# Patient Record
Sex: Male | Born: 1950 | Race: White | Hispanic: No | Marital: Married | Smoking: Current every day smoker
Health system: Southern US, Community
[De-identification: ages and names within clinical notes are randomized; demographics above are authoritative.]

## PROBLEM LIST (undated history)

## (undated) DIAGNOSIS — F32A Depression, unspecified: Secondary | ICD-10-CM

## (undated) DIAGNOSIS — Z9989 Dependence on other enabling machines and devices: Secondary | ICD-10-CM

## (undated) DIAGNOSIS — G8929 Other chronic pain: Secondary | ICD-10-CM

## (undated) DIAGNOSIS — K589 Irritable bowel syndrome without diarrhea: Secondary | ICD-10-CM

## (undated) DIAGNOSIS — F329 Major depressive disorder, single episode, unspecified: Secondary | ICD-10-CM

## (undated) DIAGNOSIS — I10 Essential (primary) hypertension: Secondary | ICD-10-CM

## (undated) DIAGNOSIS — G4733 Obstructive sleep apnea (adult) (pediatric): Secondary | ICD-10-CM

## (undated) DIAGNOSIS — D369 Benign neoplasm, unspecified site: Secondary | ICD-10-CM

## (undated) DIAGNOSIS — E785 Hyperlipidemia, unspecified: Secondary | ICD-10-CM

## (undated) DIAGNOSIS — K219 Gastro-esophageal reflux disease without esophagitis: Secondary | ICD-10-CM

## (undated) DIAGNOSIS — J449 Chronic obstructive pulmonary disease, unspecified: Secondary | ICD-10-CM

## (undated) DIAGNOSIS — C966 Unifocal Langerhans-cell histiocytosis: Secondary | ICD-10-CM

## (undated) DIAGNOSIS — K635 Polyp of colon: Secondary | ICD-10-CM

## (undated) DIAGNOSIS — I340 Nonrheumatic mitral (valve) insufficiency: Secondary | ICD-10-CM

## (undated) DIAGNOSIS — M545 Low back pain, unspecified: Secondary | ICD-10-CM

## (undated) DIAGNOSIS — I2510 Atherosclerotic heart disease of native coronary artery without angina pectoris: Secondary | ICD-10-CM

## (undated) DIAGNOSIS — F419 Anxiety disorder, unspecified: Secondary | ICD-10-CM

## (undated) HISTORY — PX: FOOT SURGERY: SHX648

## (undated) HISTORY — PX: TONSILLECTOMY: SUR1361

## (undated) HISTORY — DX: Other chronic pain: G89.29

## (undated) HISTORY — DX: Nonrheumatic mitral (valve) insufficiency: I34.0

## (undated) HISTORY — PX: ESOPHAGOGASTRODUODENOSCOPY: SHX1529

## (undated) HISTORY — PX: EAR PINNA RECONSTRUCTION W/ RIB GRAFT: SHX1484

## (undated) HISTORY — PX: INGUINAL HERNIA REPAIR: SUR1180

## (undated) HISTORY — DX: Low back pain, unspecified: M54.50

## (undated) HISTORY — DX: Irritable bowel syndrome, unspecified: K58.9

## (undated) HISTORY — DX: Hyperlipidemia, unspecified: E78.5

## (undated) HISTORY — PX: CHOLECYSTECTOMY: SHX55

## (undated) HISTORY — DX: Essential (primary) hypertension: I10

## (undated) HISTORY — DX: Obstructive sleep apnea (adult) (pediatric): G47.33

## (undated) HISTORY — DX: Depression, unspecified: F32.A

## (undated) HISTORY — DX: Low back pain: M54.5

## (undated) HISTORY — DX: Major depressive disorder, single episode, unspecified: F32.9

## (undated) HISTORY — DX: Gastro-esophageal reflux disease without esophagitis: K21.9

## (undated) HISTORY — DX: Benign neoplasm, unspecified site: D36.9

## (undated) HISTORY — DX: Atherosclerotic heart disease of native coronary artery without angina pectoris: I25.10

## (undated) HISTORY — DX: Polyp of colon: K63.5

## (undated) HISTORY — DX: Unifocal Langerhans-cell histiocytosis: C96.6

## (undated) HISTORY — DX: Obstructive sleep apnea (adult) (pediatric): Z99.89

## (undated) HISTORY — DX: Anxiety disorder, unspecified: F41.9

---

## 2000-10-25 ENCOUNTER — Encounter: Payer: Self-pay | Admitting: Internal Medicine

## 2000-10-25 ENCOUNTER — Ambulatory Visit (HOSPITAL_COMMUNITY): Admission: RE | Admit: 2000-10-25 | Discharge: 2000-10-25 | Payer: Self-pay | Admitting: Internal Medicine

## 2000-12-19 ENCOUNTER — Ambulatory Visit (HOSPITAL_COMMUNITY): Admission: RE | Admit: 2000-12-19 | Discharge: 2000-12-19 | Payer: Self-pay | Admitting: Internal Medicine

## 2001-01-11 ENCOUNTER — Encounter: Payer: Self-pay | Admitting: Emergency Medicine

## 2001-01-11 ENCOUNTER — Emergency Department (HOSPITAL_COMMUNITY): Admission: EM | Admit: 2001-01-11 | Discharge: 2001-01-12 | Payer: Self-pay | Admitting: Emergency Medicine

## 2001-01-17 ENCOUNTER — Ambulatory Visit (HOSPITAL_COMMUNITY): Admission: RE | Admit: 2001-01-17 | Discharge: 2001-01-17 | Payer: Self-pay | Admitting: Internal Medicine

## 2001-02-19 ENCOUNTER — Ambulatory Visit (HOSPITAL_COMMUNITY): Admission: RE | Admit: 2001-02-19 | Discharge: 2001-02-19 | Payer: Self-pay | Admitting: Internal Medicine

## 2001-02-19 ENCOUNTER — Encounter: Payer: Self-pay | Admitting: Internal Medicine

## 2001-03-22 ENCOUNTER — Encounter (HOSPITAL_COMMUNITY): Admission: RE | Admit: 2001-03-22 | Discharge: 2001-04-21 | Payer: Self-pay | Admitting: Podiatry

## 2001-10-22 ENCOUNTER — Encounter: Payer: Self-pay | Admitting: Family Medicine

## 2001-10-22 ENCOUNTER — Ambulatory Visit (HOSPITAL_COMMUNITY): Admission: RE | Admit: 2001-10-22 | Discharge: 2001-10-22 | Payer: Self-pay | Admitting: Family Medicine

## 2001-10-29 ENCOUNTER — Ambulatory Visit (HOSPITAL_COMMUNITY): Admission: RE | Admit: 2001-10-29 | Discharge: 2001-10-29 | Payer: Self-pay | Admitting: Internal Medicine

## 2002-02-06 ENCOUNTER — Encounter: Payer: Self-pay | Admitting: Family Medicine

## 2002-02-06 ENCOUNTER — Ambulatory Visit (HOSPITAL_COMMUNITY): Admission: RE | Admit: 2002-02-06 | Discharge: 2002-02-06 | Payer: Self-pay | Admitting: Family Medicine

## 2002-06-19 ENCOUNTER — Encounter: Payer: Self-pay | Admitting: Family Medicine

## 2002-06-19 ENCOUNTER — Ambulatory Visit (HOSPITAL_COMMUNITY): Admission: RE | Admit: 2002-06-19 | Discharge: 2002-06-19 | Payer: Self-pay | Admitting: Family Medicine

## 2002-06-27 ENCOUNTER — Encounter: Payer: Self-pay | Admitting: Family Medicine

## 2002-06-27 ENCOUNTER — Ambulatory Visit (HOSPITAL_COMMUNITY): Admission: RE | Admit: 2002-06-27 | Discharge: 2002-06-27 | Payer: Self-pay | Admitting: Family Medicine

## 2003-01-10 ENCOUNTER — Ambulatory Visit (HOSPITAL_COMMUNITY): Admission: RE | Admit: 2003-01-10 | Discharge: 2003-01-10 | Payer: Self-pay | Admitting: Family Medicine

## 2003-01-10 ENCOUNTER — Encounter: Payer: Self-pay | Admitting: Family Medicine

## 2003-02-06 ENCOUNTER — Emergency Department (HOSPITAL_COMMUNITY): Admission: EM | Admit: 2003-02-06 | Discharge: 2003-02-06 | Payer: Self-pay | Admitting: Emergency Medicine

## 2003-02-26 ENCOUNTER — Encounter: Payer: Self-pay | Admitting: Internal Medicine

## 2003-02-26 ENCOUNTER — Ambulatory Visit (HOSPITAL_COMMUNITY): Admission: RE | Admit: 2003-02-26 | Discharge: 2003-02-26 | Payer: Self-pay | Admitting: Internal Medicine

## 2003-04-16 ENCOUNTER — Ambulatory Visit (HOSPITAL_COMMUNITY): Admission: RE | Admit: 2003-04-16 | Discharge: 2003-04-16 | Payer: Self-pay | Admitting: Family Medicine

## 2004-07-20 ENCOUNTER — Ambulatory Visit (HOSPITAL_COMMUNITY): Admission: RE | Admit: 2004-07-20 | Discharge: 2004-07-20 | Payer: Self-pay | Admitting: Family Medicine

## 2004-11-24 ENCOUNTER — Ambulatory Visit: Payer: Self-pay | Admitting: Internal Medicine

## 2004-12-14 ENCOUNTER — Ambulatory Visit (HOSPITAL_COMMUNITY): Admission: RE | Admit: 2004-12-14 | Discharge: 2004-12-14 | Payer: Self-pay | Admitting: Internal Medicine

## 2004-12-14 ENCOUNTER — Ambulatory Visit: Payer: Self-pay | Admitting: Internal Medicine

## 2004-12-15 ENCOUNTER — Ambulatory Visit (HOSPITAL_COMMUNITY): Admission: RE | Admit: 2004-12-15 | Discharge: 2004-12-15 | Payer: Self-pay | Admitting: Internal Medicine

## 2005-01-26 ENCOUNTER — Ambulatory Visit: Payer: Self-pay | Admitting: Internal Medicine

## 2005-02-15 ENCOUNTER — Ambulatory Visit (HOSPITAL_COMMUNITY): Admission: RE | Admit: 2005-02-15 | Discharge: 2005-02-15 | Payer: Self-pay | Admitting: *Deleted

## 2005-02-17 ENCOUNTER — Ambulatory Visit (HOSPITAL_COMMUNITY): Admission: RE | Admit: 2005-02-17 | Discharge: 2005-02-17 | Payer: Self-pay | Admitting: Cardiology

## 2005-05-23 HISTORY — PX: COLONOSCOPY: SHX174

## 2005-09-22 ENCOUNTER — Ambulatory Visit: Payer: Self-pay | Admitting: Internal Medicine

## 2006-03-27 ENCOUNTER — Ambulatory Visit: Payer: Self-pay | Admitting: Internal Medicine

## 2006-03-28 ENCOUNTER — Ambulatory Visit (HOSPITAL_COMMUNITY): Admission: RE | Admit: 2006-03-28 | Discharge: 2006-03-28 | Payer: Self-pay | Admitting: Family Medicine

## 2006-03-30 ENCOUNTER — Ambulatory Visit (HOSPITAL_COMMUNITY): Admission: RE | Admit: 2006-03-30 | Discharge: 2006-03-30 | Payer: Self-pay | Admitting: Family Medicine

## 2006-04-06 ENCOUNTER — Encounter (INDEPENDENT_AMBULATORY_CARE_PROVIDER_SITE_OTHER): Payer: Self-pay | Admitting: Specialist

## 2006-04-06 ENCOUNTER — Ambulatory Visit (HOSPITAL_COMMUNITY): Admission: RE | Admit: 2006-04-06 | Discharge: 2006-04-06 | Payer: Self-pay | Admitting: Internal Medicine

## 2006-04-06 ENCOUNTER — Ambulatory Visit: Payer: Self-pay | Admitting: Internal Medicine

## 2006-04-10 ENCOUNTER — Ambulatory Visit (HOSPITAL_COMMUNITY): Admission: RE | Admit: 2006-04-10 | Discharge: 2006-04-10 | Payer: Self-pay | Admitting: Internal Medicine

## 2006-06-19 ENCOUNTER — Ambulatory Visit: Payer: Self-pay | Admitting: Internal Medicine

## 2006-07-31 ENCOUNTER — Ambulatory Visit (HOSPITAL_COMMUNITY): Admission: RE | Admit: 2006-07-31 | Discharge: 2006-07-31 | Payer: Self-pay | Admitting: *Deleted

## 2007-03-02 ENCOUNTER — Ambulatory Visit (HOSPITAL_COMMUNITY): Admission: RE | Admit: 2007-03-02 | Discharge: 2007-03-02 | Payer: Self-pay | Admitting: Family Medicine

## 2007-03-20 ENCOUNTER — Ambulatory Visit: Payer: Self-pay | Admitting: Pulmonary Disease

## 2007-04-24 ENCOUNTER — Ambulatory Visit: Payer: Self-pay | Admitting: Pulmonary Disease

## 2007-05-24 HISTORY — PX: LUNG BIOPSY: SHX232

## 2007-05-28 ENCOUNTER — Ambulatory Visit: Payer: Self-pay | Admitting: Gastroenterology

## 2007-07-10 ENCOUNTER — Ambulatory Visit: Payer: Self-pay | Admitting: Internal Medicine

## 2007-08-16 ENCOUNTER — Ambulatory Visit: Payer: Self-pay | Admitting: Internal Medicine

## 2007-08-23 ENCOUNTER — Ambulatory Visit (HOSPITAL_COMMUNITY): Admission: RE | Admit: 2007-08-23 | Discharge: 2007-08-23 | Payer: Self-pay | Admitting: Family Medicine

## 2007-09-26 ENCOUNTER — Ambulatory Visit: Payer: Self-pay | Admitting: Thoracic Surgery

## 2007-10-04 ENCOUNTER — Ambulatory Visit (HOSPITAL_COMMUNITY): Admission: RE | Admit: 2007-10-04 | Discharge: 2007-10-04 | Payer: Self-pay | Admitting: Cardiology

## 2007-10-11 ENCOUNTER — Encounter: Payer: Self-pay | Admitting: Thoracic Surgery

## 2007-10-11 ENCOUNTER — Ambulatory Visit: Payer: Self-pay | Admitting: Thoracic Surgery

## 2007-10-11 ENCOUNTER — Ambulatory Visit (HOSPITAL_COMMUNITY): Admission: RE | Admit: 2007-10-11 | Discharge: 2007-10-14 | Payer: Self-pay | Admitting: Thoracic Surgery

## 2007-10-22 DIAGNOSIS — I10 Essential (primary) hypertension: Secondary | ICD-10-CM | POA: Insufficient documentation

## 2007-10-22 DIAGNOSIS — E782 Mixed hyperlipidemia: Secondary | ICD-10-CM | POA: Insufficient documentation

## 2007-10-22 DIAGNOSIS — J45909 Unspecified asthma, uncomplicated: Secondary | ICD-10-CM | POA: Insufficient documentation

## 2007-10-22 DIAGNOSIS — G4733 Obstructive sleep apnea (adult) (pediatric): Secondary | ICD-10-CM | POA: Insufficient documentation

## 2007-10-22 DIAGNOSIS — J309 Allergic rhinitis, unspecified: Secondary | ICD-10-CM | POA: Insufficient documentation

## 2007-10-25 ENCOUNTER — Encounter: Admission: RE | Admit: 2007-10-25 | Discharge: 2007-10-25 | Payer: Self-pay | Admitting: Thoracic Surgery

## 2007-10-25 ENCOUNTER — Ambulatory Visit: Payer: Self-pay | Admitting: Thoracic Surgery

## 2007-11-01 ENCOUNTER — Ambulatory Visit: Payer: Self-pay | Admitting: Thoracic Surgery

## 2007-11-22 ENCOUNTER — Encounter: Admission: RE | Admit: 2007-11-22 | Discharge: 2007-11-22 | Payer: Self-pay | Admitting: Thoracic Surgery

## 2007-11-22 ENCOUNTER — Ambulatory Visit: Payer: Self-pay | Admitting: Thoracic Surgery

## 2008-01-02 ENCOUNTER — Ambulatory Visit: Payer: Self-pay | Admitting: Thoracic Surgery

## 2008-01-02 ENCOUNTER — Encounter: Admission: RE | Admit: 2008-01-02 | Discharge: 2008-01-02 | Payer: Self-pay | Admitting: Thoracic Surgery

## 2008-06-13 ENCOUNTER — Ambulatory Visit (HOSPITAL_COMMUNITY): Admission: RE | Admit: 2008-06-13 | Discharge: 2008-06-13 | Payer: Self-pay | Admitting: Internal Medicine

## 2008-08-08 DIAGNOSIS — K589 Irritable bowel syndrome without diarrhea: Secondary | ICD-10-CM | POA: Insufficient documentation

## 2008-08-08 DIAGNOSIS — K449 Diaphragmatic hernia without obstruction or gangrene: Secondary | ICD-10-CM | POA: Insufficient documentation

## 2008-08-08 DIAGNOSIS — R1319 Other dysphagia: Secondary | ICD-10-CM | POA: Insufficient documentation

## 2008-08-08 DIAGNOSIS — R630 Anorexia: Secondary | ICD-10-CM | POA: Insufficient documentation

## 2008-08-08 DIAGNOSIS — K219 Gastro-esophageal reflux disease without esophagitis: Secondary | ICD-10-CM | POA: Insufficient documentation

## 2008-08-08 DIAGNOSIS — F329 Major depressive disorder, single episode, unspecified: Secondary | ICD-10-CM | POA: Insufficient documentation

## 2008-08-08 DIAGNOSIS — R1013 Epigastric pain: Secondary | ICD-10-CM | POA: Insufficient documentation

## 2008-08-08 DIAGNOSIS — R1310 Dysphagia, unspecified: Secondary | ICD-10-CM

## 2008-08-08 DIAGNOSIS — R634 Abnormal weight loss: Secondary | ICD-10-CM | POA: Insufficient documentation

## 2008-08-08 DIAGNOSIS — K921 Melena: Secondary | ICD-10-CM | POA: Insufficient documentation

## 2008-08-08 DIAGNOSIS — R197 Diarrhea, unspecified: Secondary | ICD-10-CM | POA: Insufficient documentation

## 2008-08-11 ENCOUNTER — Ambulatory Visit: Payer: Self-pay | Admitting: Internal Medicine

## 2008-08-11 DIAGNOSIS — M549 Dorsalgia, unspecified: Secondary | ICD-10-CM | POA: Insufficient documentation

## 2008-08-11 DIAGNOSIS — F411 Generalized anxiety disorder: Secondary | ICD-10-CM | POA: Insufficient documentation

## 2008-08-11 DIAGNOSIS — Z91038 Other insect allergy status: Secondary | ICD-10-CM | POA: Insufficient documentation

## 2008-08-11 DIAGNOSIS — J441 Chronic obstructive pulmonary disease with (acute) exacerbation: Secondary | ICD-10-CM | POA: Insufficient documentation

## 2008-08-11 DIAGNOSIS — K222 Esophageal obstruction: Secondary | ICD-10-CM | POA: Insufficient documentation

## 2008-08-11 DIAGNOSIS — I080 Rheumatic disorders of both mitral and aortic valves: Secondary | ICD-10-CM | POA: Insufficient documentation

## 2008-08-21 ENCOUNTER — Telehealth (INDEPENDENT_AMBULATORY_CARE_PROVIDER_SITE_OTHER): Payer: Self-pay

## 2009-01-30 IMAGING — CR DG CHEST 2V
2 series · 2 of 2 positions shown · non-contrast
Comparison: 10/14/2007

CLINICAL DATA: Status post resection of right upper lobe lung
nodule

CHEST - 2 VIEW

[w chest pa]
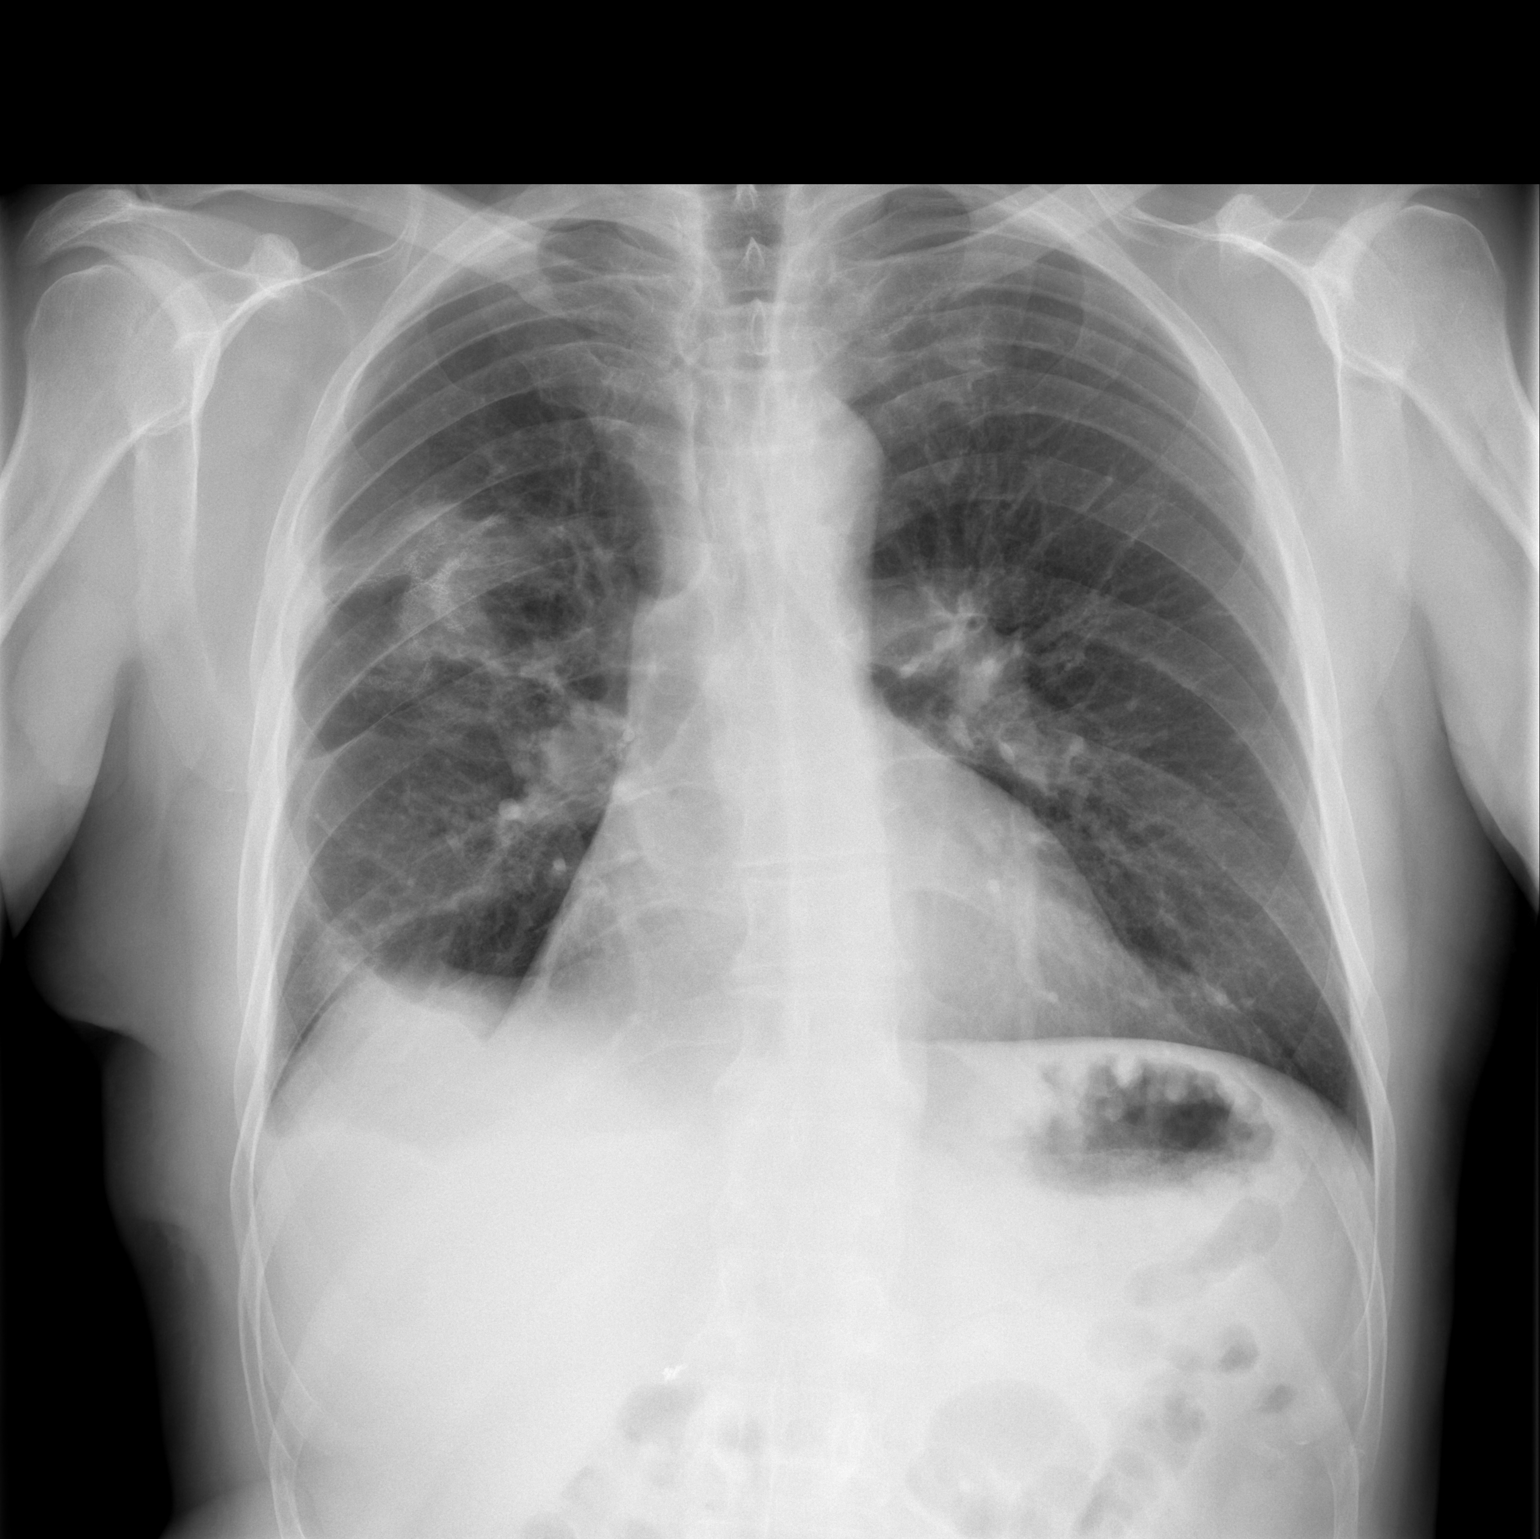

[w chest lat]
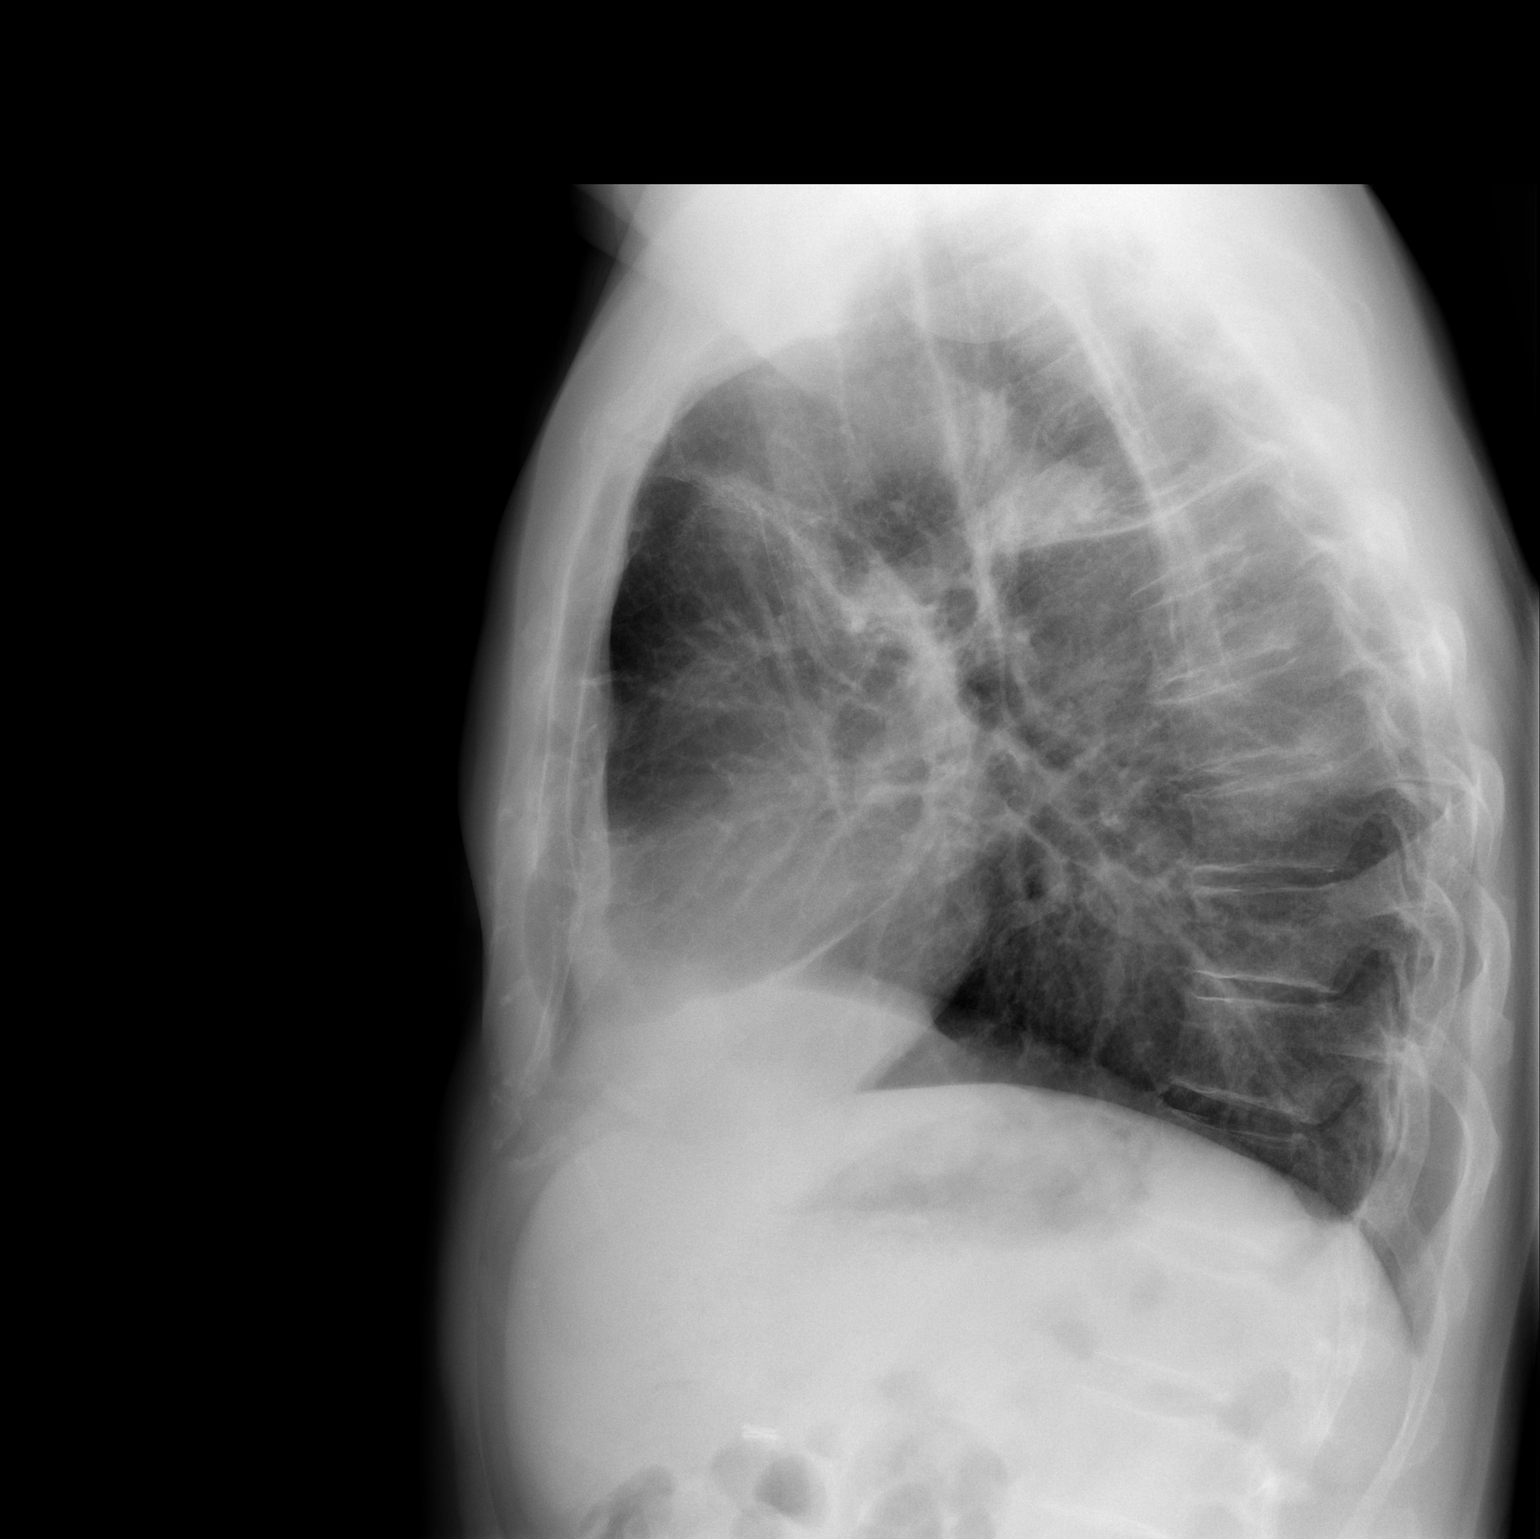

[2 of 2 positions shown; findings below may reference images not displayed]

FINDINGS: At the site of partial lung resection in the right
hemithorax, stable appearance of metallic sutures.  Overall density
at the site of resection has decreased since the prior study.  No
pneumothorax is seen.  No pleural fluid.  No edema or infiltrates.
IMPRESSION: No acute findings.  Decreased density surrounding the staples in
the right upper lung at the site of pulmonary nodule resection.

## 2009-07-13 ENCOUNTER — Encounter (INDEPENDENT_AMBULATORY_CARE_PROVIDER_SITE_OTHER): Payer: Self-pay | Admitting: *Deleted

## 2009-08-13 ENCOUNTER — Ambulatory Visit: Payer: Self-pay | Admitting: Internal Medicine

## 2009-09-17 ENCOUNTER — Ambulatory Visit (HOSPITAL_COMMUNITY): Admission: RE | Admit: 2009-09-17 | Discharge: 2009-09-17 | Payer: Self-pay | Admitting: Family Medicine

## 2010-04-13 ENCOUNTER — Ambulatory Visit (HOSPITAL_COMMUNITY): Admission: RE | Admit: 2010-04-13 | Discharge: 2010-04-13 | Payer: Self-pay | Admitting: Family Medicine

## 2010-06-24 NOTE — Assessment & Plan Note (Signed)
Summary: yr fu/Gerd/ss   Visit Type:  Follow-up Visit Primary Care Provider:  [facility]  Chief Complaint:  F/U [name].  History of Present Illness: Followup GERD history of dysphagia secondary to Schatzki's ring requiring Maloney dilation previously. Patient is here for two-year followup. Overall he is doing very well from a GI standpoint -  moving his bowels one to 2 times daily he is not having any rectal bleeding or melena. Last colonoscopy 2007 demonstrated a friable anal canal; he is due for routine screening 2017. He takes omeprazole 20 mg orally daily to twice daily as needed for control of reflux. We does have a flare up to b.i.d. her regimen works were well for him. Again, he is not having any dysphagia nausea vomiting or abdominal pain. He did have trouble with anorexia and weight loss the secondary depression previously but I'm glad to see he has gained 8 pounds since his last weight check here in 2009. He is having some back problems and is supposed to be seeing Dr. Nobie [name] later this week.   Current Medications (verified): 1)  Omeprazole 20 Mg  Tbec (Omeprazole) .... Take 1 Tablet By Mouth Twice A Day, 30 Minutes Before Breakfast and Evening Meal 2)  Simvastatin 20 Mg  Tabs (Simvastatin) .... Take 1 Tab By Mouth At Bedtime 3)  Tricor 145 Mg  Tabs (Fenofibrate) .... Take 1 Tab By Mouth At Bedtime 4)  Aspirin Low Dose 81 Mg  Tabs (Aspirin) .... Take 1 Tablet By Mouth Once A Day 5)  Trazodone Hcl 100 Mg  Tabs (Trazodone Hcl) .... Take 1 Tab By Mouth At Bedtime 6)  Lexapro 10 Mg  Tabs (Escitalopram Oxalate) .... Take 1 Tablet By Mouth Once A Day 7)  Multivitamins   Tabs (Multiple Vitamin) .... Take 1 Tablet By Mouth Once A Day 8)  Alprazolam 1 Mg  Tabs (Alprazolam) .... Take 1 Tab By Mouth Every 8 Hours As Needed 9)  Hydrocodone-Acetaminophen 10-650 Mg  Tabs (Hydrocodone-Acetaminophen) .... Take 1 Tab By Mouth Every 4 Hours As Needed 10)  Diovan 160 Mg Tabs (Valsartan) .... 1/2 Tab (80  Mg) Once Daily 11)  Fish Oil 1000 Mg Caps (Omega-3 Fatty Acids) .... Two Times A Day 12)  Metoprolol Tartrate 25 Mg Tabs (Metoprolol Tartrate) .... Take 1/2 Two Times A Day 13)  C-Pap Machine .... At Bedtime 14)  Albuterol Neb .... As Needed 15)  Albuterol Inhaler .... 2 Puffs Every 6 Hours As Needed 16)  Flexeril 10 Mg Tabs (Cyclobenzaprine Hcl) .... Three Times A Day As Needed  Allergies (verified): 1)  * Bee Sting  Past History:  Past Medical History: Last updated: 08/11/2008 Current Problems:  DYSLIPIDEMIA (ICD-272.4) HYPERTENSION (ICD-401.9) ASTHMA (ICD-493.90) ALLERGIC RHINITIS (ICD-477.9) OBSTRUCTIVE SLEEP APNEA (ICD-327.23)   Anxiety Disorder COPD Depression GERD, h/o erosive reflux esophagitis with multiple dilations for Schatzki's rings and strictures, last dilation in 2006.  The last EGD in 2007 showed small hiatal hernia, adenomatous appearing gastric mucosa in the body but biopsies benign.  Small bowel biopsies negative for villous atrophy. Irritable Bowel Syndrome Colonscopy in 2007 showed friable anal canal, normal terminal ileum, hyperplastic rectal polyp (due for TCS 2017). H/O H. pylori s/p Prevpac in 2001 Chronic low back pain. 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  Family History: Last updated: 08/11/2008 No FH of Colon Cancer:  Social History: Last updated: 08/11/2008 Marital Status:Widowed Children:  Occupation: Disabled. Patient currently smokes.  1ppd Alcohol Use - no  Risk  Factors: Smoking Status: current (08/11/2008)  Past Surgical History: LEFT INGUINAL HERNIA REPAIR RIGHT EAR RECONSTRUCTION Cholecystectomy Tonsillectomy Right foot surgery Right Lung biopsy 2009  Vital Signs:  Patient profile:   60 year old male Height:      67 inches Weight:      182 pounds BMI:     28.61 Temp:     97.6 degrees F oral Pulse rate:   56 / minute BP sitting:   118 / 70  (left  arm) Cuff size:   regular  Vitals Entered By: Cloria Spring LPN (August 13, 2009 2:01 PM)  Physical Exam  General:  alert conversant no acute distress Eyes:  no scleral icterus., pink Lungs:  clear to Heart:  regular rate and rhythm without murmur gallop rub Abdomen:  nondistended positive bowel sounds soft nontender without mass or organomegaly  Impression & Recommendations: Impression very pleasant 60 year old gentleman with long-standing GERD doing well on Prilosec 20 mg orally daily to twice daily. He is not having any alarm symptoms. I feel the benefits of long-term acid suppression therapy outweigh any theoretical risks.  He will be due for routine screening colonoscopy in 2017.  Recommendations continue Prilosec 20mg  orally daily to b.i.d. continue antireflux diet.  Unless something comes up, will plan to see this nice gentleman back in 2 years.       Appended Document: Orders Update    Clinical Lists Changes  Orders: Added new Service order of Est. Patient Level III (04540) - Signed

## 2010-06-24 NOTE — Letter (Signed)
Summary: Appointment Reminder  [facility]  [address]   [place], [place] [address]   Phone: [phone]  Fax: [phone]       July 13, 2009   [name] [address] [place], [place]  [address] Apr 07, 1951    Dear [name],  We have been unable to reach you by phone to schedule a follow up   appointment that was recommended for you by Dr. Jena Gauss.  It is very   important that we reach you to schedule an appointment. We hope that you  allow [place] to participate in your health care needs. Please contact us at  [phone] at your earliest convenience to schedule your appointment.  Sincerely,    [facility] R. Roetta Sessions, M.D.    Kassie Mends, M.D. Lorenza Burton, FNP-BC    Tana Coast, PA-C Phone: [phone]    Fax: [phone]

## 2010-08-10 ENCOUNTER — Other Ambulatory Visit (HOSPITAL_COMMUNITY): Payer: Self-pay | Admitting: Family Medicine

## 2010-08-10 DIAGNOSIS — R1031 Right lower quadrant pain: Secondary | ICD-10-CM

## 2010-08-12 ENCOUNTER — Other Ambulatory Visit (HOSPITAL_COMMUNITY): Payer: Self-pay

## 2010-08-16 ENCOUNTER — Ambulatory Visit (HOSPITAL_COMMUNITY)
Admission: RE | Admit: 2010-08-16 | Discharge: 2010-08-16 | Disposition: A | Discharge status: Home / Self Care | Payer: Medicare Other | Source: Ambulatory Visit | Attending: Family Medicine | Admitting: Family Medicine

## 2010-08-16 DIAGNOSIS — R1031 Right lower quadrant pain: Secondary | ICD-10-CM | POA: Insufficient documentation

## 2010-08-16 DIAGNOSIS — K4090 Unilateral inguinal hernia, without obstruction or gangrene, not specified as recurrent: Secondary | ICD-10-CM | POA: Insufficient documentation

## 2010-08-16 DIAGNOSIS — R197 Diarrhea, unspecified: Secondary | ICD-10-CM | POA: Insufficient documentation

## 2010-09-21 ENCOUNTER — Encounter (HOSPITAL_COMMUNITY): Payer: Self-pay

## 2010-09-21 ENCOUNTER — Ambulatory Visit (HOSPITAL_COMMUNITY)
Admission: RE | Admit: 2010-09-21 | Discharge: 2010-09-21 | Disposition: A | Discharge status: Home / Self Care | Payer: Medicare Other | Source: Ambulatory Visit | Attending: Family Medicine | Admitting: Family Medicine

## 2010-09-21 ENCOUNTER — Other Ambulatory Visit (HOSPITAL_COMMUNITY): Payer: Self-pay | Admitting: Family Medicine

## 2010-09-21 DIAGNOSIS — R059 Cough, unspecified: Secondary | ICD-10-CM | POA: Insufficient documentation

## 2010-09-21 DIAGNOSIS — J449 Chronic obstructive pulmonary disease, unspecified: Secondary | ICD-10-CM

## 2010-09-21 DIAGNOSIS — R05 Cough: Secondary | ICD-10-CM | POA: Insufficient documentation

## 2010-09-21 DIAGNOSIS — K219 Gastro-esophageal reflux disease without esophagitis: Secondary | ICD-10-CM

## 2010-09-21 DIAGNOSIS — Z0000 Encounter for general adult medical examination without abnormal findings: Secondary | ICD-10-CM

## 2010-09-21 DIAGNOSIS — J4489 Other specified chronic obstructive pulmonary disease: Secondary | ICD-10-CM

## 2010-09-21 HISTORY — DX: Chronic obstructive pulmonary disease, unspecified: J44.9

## 2010-10-05 NOTE — Op Note (Signed)
[name], [name]              ACCOUNT NO.:  [id number]   MEDICAL RECORD NO.:  [id number]          PATIENT TYPE:  INP   LOCATION:  3316                         FACILITY:  [facility]   PHYSICIAN:  Ines Bloomer, M.D. DATE OF BIRTH:  01-16-1951   DATE OF PROCEDURE:  DATE OF DISCHARGE:                               OPERATIVE REPORT   PREOPERATIVE DIAGNOSIS:  Multiple pulmonary nodules.   POSTOPERATIVE DIAGNOSIS:  1. Multiple pulmonary nodules.  2. Possible eosinophilic granulomas.   SURGEON:  Ines Bloomer, MD   General anesthesia.   After percutaneous insertion of all monitoring lines, the patient turned  in the right lateral thoracotomy position.  This patient had had serial  CTs which showed increased enlargement of the two CT scans.  Multiple  pulmonary nodules that were increasing in size.  He was brought to the  operating room for a biopsy after general anesthesia.  A dual-lumen tube  was inserted.  The patient was turned to the right lateral thoracotomy  position.  He was prepped and draped in the usual sterile manner.  Trocar site was made in the seventh intercostal space of the anterior  axillary line.  A 0-degree scope was inserted.  No lesions could be seen  on the surface, so we made a 2-3.5-cm incision at the fifth intercostal  space of the midaxillary line and were able to palpate the lesions in  the upper lobe.  There were several enlargements in the posterior  segment.  So, grasping this through the upper trocar site, we came in  with a Autosuture, 60 green, and resected the lesion.  We sent part of  it for culture and then recognized some other ones in the anterior  segment of the right upper lobe, grasped that with a Kaiser ring forceps  and then resected that with autosuture stapler.  No air leaks were seen.  A part of one of the nodules was sent for culture.  A chest tube was  brought into the trocar site and tied in place with 0 silk.  Marcaine  block was  done in the usual fashion.  A single On-Q was inserted in the  usual fashion subpleurally and secured in place.  The chest was closed  with one pericostal drilling through the sixth rib and passed around the  fifth rib.  Vicryl #1 in the muscle layer, 2-0 Vicryl for the  subcutaneous tissue, and Dermabond for the skin.  The patient was  returned to the recovery room in stable condition.      Ines Bloomer, M.D.  Electronically Signed     DPB/MEDQ  D:  10/11/2007  T:  10/11/2007  Job:  [id number]   cc:   Dani Gobble, MD

## 2010-10-05 NOTE — Letter (Signed)
Sep 26, 2007   [name], M.D.  [address], [address]  [place], [place] [address]   Re:  [name], [name]              DOB:  11-24-1950   Dear [name]:   I saw this patient in the office today.  This 60 year old patient has  had a long history of coronary artery disease.  He apparently had a CT  scan done in 2007 that shows multiple tiny 2-3-mm nodules.  A repeat CT  scan this year showed multiple lung nodules both on the right and the  left side, some up to 10 mm in size, so he has either had progression of  the previous tiny nodules or these are all new nodules.  Whatever the  case is, I feel he probably needs to have biopsies of these multiple  small nodules.  In addition, he has coronary artery disease and has been  followed by Dr. Domingo Sep, who has scheduled him to have a cardiac  catheterization on May 14.  He had no hemoptysis, fever, chills,  excessive sputum, but does smoke at least a pack of cigarettes a day.   He has no allergies.   His medications include:  1.  Omeprazole 20 mg day.  2.  Alprazolam 1 mg every 8 hours as needed.  3.  Lopressor 25 mg a day.  4.  Simvastatin 20 mg at bedtime.  5.  Tricor 145 mg at bedtime.  6.  Diovan 80 mg in the morning.  7.  Hydrocodone for pain.  8.  Aspirin.  9.  Trazodone 100 mg at night.  10.  Lexapro 10 mg in the morning.  11.  Vitamins.   PAST MEDICAL HISTORY:  1. Significant for coronary artery disease with mitral regurgitation.  2. Depression.  3. Chronic back pain.  4. Dyspepsia.  5. He also has dyslipidemia.  6. Chronic obstructive pulmonary disease.   FAMILY HISTORY:  Positive for valvular disease but negative for cancer.   SOCIAL HISTORY:  He is single, has one living child and one that has  expired.  He smokes 1-1/2 packs of cigarettes a day.  Does not drink  alcohol on a regular basis.   REVIEW OF SYSTEMS:  He is 173 pounds.  He is 5 feet 7 inches.  He has  had some recent weight loss and loss of  appetite.  CARDIAC:  He has some chest pain and pressure, a cardiac murmur.  No  other fibrillation.  PULMONARY:  He has a cough, asthma and wheezing, no hemoptysis.  GI:  He has reflux and dysphagia, diarrhea and abdominal pain.  GU:  No kidney disease, dysuria or frequent urination.  VASCULAR:  No claudication, DVT or TIAs.  NEUROLOGIC:  No headaches, blackouts or seizures.  MUSCULOSKELETAL:  No arthritis but some muscle pain.  PSYCHIATRIC:  He has been treated for depression and nervousness.  EYE/ENT:  No change in his eyesight or hearing.  HEMATOLOGIC:  No problems with bleeding or clotting disorders.   PHYSICAL EXAM:  He is a thin, nervous-appearing white male in no acute  distress.  His pulmonary function tests showed an FVC of 3.97 with an  FEV-1 of 3.23.  His blood pressure was 124/71, pulse 58, respirations  18, saturations were 99%.  Head, eyes, ears, nose and throat:  Unremarkable.  Neck is supple without thyromegaly.  Chest:  There is a  1/6 systolic ejection murmur.  Heart:  Regular sinus  rhythm.  Abdomen:  Soft.  There is no hepatosplenomegaly.  Pulses are 2+.  There is no  clubbing or edema.  Neurologic:  He is oriented x3.  Sensory and motor  intact.  Cranial nerves are intact.   I feel that he has some type of either infectious process or malignant  process going on his lungs, particularly since there has been a marked  change.  I plan to see him in the office and I plan to schedule him for  the 21st to have a right VATS lung biopsy x2 or 3.   I appreciate the opportunity of seeing this patient.   Ines Bloomer, M.D.  Electronically Signed   DPB/MEDQ  D:  09/26/2007  T:  09/26/2007  Job:  [id number]   cc:   Dani Gobble, MD

## 2010-10-05 NOTE — Assessment & Plan Note (Signed)
[name], VORA               CHART#:  [id number]   DATE:  08/16/2007                       DOB:  Jan 24, 1951   Followup gastroesophageal reflux disease, weight loss, anorexia.  The  patient has turned around significantly since seen on 07/10/2007.  I  went on ahead and treated the yeast in his stool with a 10-day course of  Diflucan.  Bowel movements have reverted to normal.  He has 1 to 2 bowel  movements daily.  Has a bowel movement after drinking 2 cups of coffee  each morning.  If he does not drink his coffee, he is constipated.  He  has not had any blood per rectum.  Reflux symptoms continue to be well  controlled on omeprazole 20 mg orally daily.  He has gained 5-1/2 pounds  since he was last seen previously.  He is slated to have physical with  Dr. Nobie Putnam later this month.   CURRENT MEDICATIONS:  See updated list.   ALLERGIES:  No known drug allergies.   PHYSICAL EXAMINATION:  He looks good.  Weight 174, height 5 feet 6 inches, temp 97.8, BP 110/80, pulse 56.  SKIN:  Warm and dry.  CHEST:  Lungs are clear to auscultation.  CARDIAC EXAM:  Regular rate and rhythm without murmurs, gallops, or  rubs.  ABDOMEN:  Flat, positive bowel sounds, soft, nontender without  appreciable mass or organomegaly.   ASSESSMENT:  1. Diarrhea resolved.  Yeast, in this case, may have been a pathogen.      At any rate, diarrhea has resolved post treatment with fluconazole.  2. Gastroesophageal reflux disease symptoms well controlled on      omeprazole.   RECOMMENDATIONS:  Continue omeprazole 20 mg orally for reflux.  Unless  something comes up, I plan to see this nice gentleman back in 1 year.       [name], M.D.  Electronically Signed     RMR/MEDQ  D:  08/16/2007  T:  08/16/2007  Job:  [id number]   cc:   Patrica Duel, M.D.  Domingo Sep, MD

## 2010-10-05 NOTE — Letter (Signed)
January 02, 2008   Jonell Cluck, MD  [address]  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  His incisions are well healed.  His chest x-ray  is stable.  I gave him a prescription for nicotine to hopefully help him  stop smoking, and we thought that he might check with you for anymore  help in his care.  I will see him back again if he has any future  problems.  His blood pressure was 104/74, pulse 56, respirations 16, and  sats were 98%.   Ines Bloomer, M.D.  Electronically Signed   DPB/MEDQ  D:  01/02/2008  T:  01/02/2008  Job:  [id number]

## 2010-10-05 NOTE — Assessment & Plan Note (Signed)
OFFICE VISIT   TEJAS, SEAWOOD  DOB:  August 14, 1950                                        November 01, 2007  CHART #:  [id number]   Blood pressure 103/63, pulse 60, respirations 22, sats were 99%.  He  came back today to check his chest tube site and is feeling well with no  evidence of infection.  His lungs are clear to auscultation and  percussion.  I will see him back again in 3 weeks' with chest x-ray.   Ines Bloomer, M.D.  Electronically Signed   DPB/MEDQ  D:  11/01/2007  T:  11/02/2007  Job:  [id number]

## 2010-10-05 NOTE — Discharge Summary (Signed)
[name], [name]              ACCOUNT NO.:  [id number]   MEDICAL RECORD NO.:  [id number]          PATIENT TYPE:  INP   LOCATION:  3316                         FACILITY:  [facility]   PHYSICIAN:  Ines Bloomer, M.D. DATE OF BIRTH:  Jan 16, 1951   DATE OF ADMISSION:  10/11/2007  DATE OF DISCHARGE:  10/14/2007                               DISCHARGE SUMMARY   PRIMARY ADMITTING DIAGNOSIS:  Multiple pulmonary nodules.   ADDITIONAL/DISCHARGE DIAGNOSES:  1. Multiple pulmonary nodules.  2. History of coronary artery disease.  3. Depression.  4. Hypertension.  5. Gastroesophageal reflux disease.  6. History of mitral regurgitation.  7. Dyslipidemia.  8. Chronic obstructive pulmonary disease.  9. Chronic back pain.  10.History of tobacco abuse.   PROCEDURE PERFORMED:  1. Right VATS.  2. Right upper lobe wedge resection.   HISTORY:  The patient is a 60 year old male, who has a history of  multiple pulmonary nodules based on a CT scan done in 2007.  He has been  followed for this and underwent a repeat CT scan recently, which showed  some progression of the bilateral pulmonary nodules as well as several  new-appearing nodules.  He does have a history of tobacco abuse, but  denies hemoptysis, chills, fever, or sputum production.  He was seen by  Dr. Edwyna Shell, who felt that at this time he should undergo a right VATS  with lung biopsies in order to obtain a diagnosis.  Dr. Edwyna Shell explained  the risks, benefits, and alternatives of surgery to the patient and he  agreed to proceed.   HOSPITAL COURSE:  Mr. Seith was admitted to [facility].  On  the date of this admission, he was taken to the operating room where he  underwent a right VATS with lung biopsies.  Intraoperative frozen  section showed questionable eosinophilic granulomas.  He tolerated the  procedure well and was transferred to the step-down unit in stable  condition.  Postoperatively, he has done very well.  His  chest tubes  were removed in the usual fashion.  He has been ambulating around the  unit without problems.  He has remained afebrile and his vital signs  have been stable.  His incisions are all healing well.  His chest x-ray  has remained stable post chest tube removal.  His final pathology  remains pending at this time.  His most recent labs showed sodium 138,  potassium 4.1, BUN 12, creatinine 0.99, white count 12.9, hemoglobin  13.1, hematocrit 38.3, platelets 184.  He has been evaluated on morning  rounds on Oct 14, 2007, and deemed ready for discharge to home at this  time.   Discharge medications are as follows;  1. Tylox 1-2 q. 4 h. p.r.n. for pain.  2. Omeprazole 20 mg daily.  3. Alprazolam 1 mg q.8 h. P.r.n.  4. Metoprolol 25 mg b.i.d.  5. Simvastatin 20 mg nightly.  6. Tricor 145 mg daily.  7. Aspirin 81 mg daily.  8. Trazodone 100 mg nightly.  9. Lexapro 10 mg daily.  10.Multivitamin daily.   DISCHARGE INSTRUCTIONS:  He is asked to  refrain from driving, heavy  lifting, or strenuous activity.  He may continue ambulating daily and  using his Incentive spirometer.  He may shower daily and clean his  incisions with soap and water.  He will continue the same preoperative  diet.   DISCHARGE FOLLOW-UP:  He will see Dr. Edwyna Shell back in the office in 1  week with a chest x-ray.  In the interim if he experiences any problems  or has questions, he is asked to contact our office immediately.      Coral Ceo, P.A.      Ines Bloomer, M.D.  Electronically Signed    GC/MEDQ  D:  10/14/2007  T:  10/14/2007  Job:  [id number]   cc:   Patrica Duel, M.D.  Dani Gobble, MD

## 2010-10-05 NOTE — Assessment & Plan Note (Signed)
[name], [name]               CHART#:  [id number]   DATE:  07/10/2007                       DOB:  April 03, 1951   Last seen May 28, 2007.   1. Long standing gastroesophageal reflux disease.  Some weight loss.  2. Former history of diarrhea.   [name] reflux symptoms continue to be well-controlled on  omeprazole 20 mg orally daily.  He is not having any dysphagia or  odynophagia, or melena or rectal bleeding.  He was complaining of  diarrhea which started sometime after taking antibiotics in the fall.  He continues to have 4-5 nonbloody bowel movements daily.  Depression  has been an ongoing issue since the loss of his son and wife.  C. diff  toxin assay came back negative since his last visit.  He was found to  have moderate yeast on ova and parasites.  Elective ferrin was negative.  His weight is down 1-1/2 pounds.   CURRENT MEDICATIONS:  See updated list.   ALLERGIES:  NO KNOWN DRUG ALLERGIES.   PHYSICAL EXAMINATION:  Exam today, he appears well.  Weight 168.5.  Height 5 feet 6 inches.  Temp 98.2, BP 190/64.  Pulse 64.  CHEST:  Lungs are clear to auscultation.  CARDIAC:  Regular rate and rhythm, without murmur, gallop, or rub.  ABDOMEN:  Nondistended.  Positive bowel sounds.  Soft , nontender  without masses or organomegaly.   Not mentioned above, Mr. [name] was given some Align at the last visit.  He felt it caused abdominal cramps and stopped taking it.   ASSESSMENT:  1. Gastroesophageal reflux disease symptoms, well-controlled on      omeprazole.  2. Diarrheal illness, protracted since antibiotics for respiratory      tract infection previously.  Interestingly he has yeast in his      stool, which may or may not be a pathogen seen here on the side of      treating it empirically.   RECOMMENDATIONS:  Diflucan x10 days 200 mg initially followed by 1 dose  daily for a total of 10 days.  Will plan to see him back in 1 month and  see how things are  going.       [name], M.D.  Electronically Signed     RMR/MEDQ  D:  07/10/2007  T:  07/11/2007  Job:  [id number]   cc:   Patrica Duel, M.D.

## 2010-10-05 NOTE — H&P (Signed)
[name], [name]              ACCOUNT NO.:  [id number]   MEDICAL RECORD NO.:  [id number]          PATIENT TYPE:  OIB   LOCATION:  2899                         FACILITY:  [facility]   PHYSICIAN:  Ines Bloomer, M.D. DATE OF BIRTH:  08/16/1950   DATE OF ADMISSION:  10/04/2007  DATE OF DISCHARGE:  10/04/2007                              HISTORY & PHYSICAL   CHIEF COMPLAINT:  Multiple pulmonary nodules.   HISTORY OF PRESENT ILLNESS:  This 60 year old patient has a long history  of coronary artery disease.  CT scan done in 2007 showed multiple 2- to  3-mm nodules.  A repeat CT scan this year showed multiple lung nodules  in the right and the left, somewhat 10-mm in size, in which he had  progression of the small nodules and several new nodules.  He has  coronary artery disease and underwent a coronary catheterization through  Dr. Domingo Sep.  He has had no hemoptysis, fever, chills, or excessive  sputum.  He smokes 1 pack a day, and the weight has been stable.   PAST MEDICAL HISTORY:  No allergies.   MEDICATIONS:  1. Omeprazole 20 mg a day.  2. Alprazolam 1 mg every 8 hours as needed.  3. Lopressor 25 mg a day.  4. Simvastatin 20 mg at bedtime.  5. TriCor 145 mg at night.  6. Diovan 80 mg in the morning.  7. Hydrocodone for pain.  8. Aspirin.  9. Trazodone 100 mg at night.  10.Lexapro 10 mg at night.   He has been treated for depression, hypertension, and coronary artery  disease and reflux as well as mitral regurgitation, chronic back pain,  dyslipidemia, and chronic obstructive pulmonary disease.   FAMILY HISTORY:  Positive for valvular disease, negative for cancer.   SOCIAL HISTORY:  He is single, has 1 living child and 1 has expired.  He  smokes a pack and a half a day.  He does not drink alcohol on a regular  basis.   REVIEW OF SYSTEMS:  He is 173 pounds.  He is 5 feet 7 inches tall.  He  had some mild recent weight loss and loss of appetite.  CARDIAC:  He has  some  chest pain, cardiac murmur, and no atrial fibrillation.  PULMONARY:  He has cough, some asthma and wheezing, no hemoptysis.  GI:  He has  reflux and dysphagia, diarrhea, and abdominal pain.  GU:  No kidney  disease, dysuria, or frequent urination.  VASCULAR:  No claudication,  DVT, or TIAs.  NEUROLOGICAL:  No dizziness, headaches, blackouts, or  seizures.  MUSCULOSKELETAL:  No arthritis, but some muscular pain.  PSYCHIATRIC:  He has been treated for depression and nervousness.  EYES/ENT:  No changes in his eyes or hearing.  HEMATOLOGIC:  No problems  with bleeding, clotting disorders, or anemia.   PHYSICAL EXAMINATION:  GENERAL:  He is a nervous-appearing Caucasian  male in no acute distress.  VITAL SIGNS:  Blood pressure is 124/71, pulse 58, respirations 18, and  saturating 99%.  HEAD:  Atraumatic.  EYES:  Pupils are equal, reactive to light and accommodation.  Extraocular movements are normal.  EARS:  Tympanic membranes intact.  NOSE:  There is no septal deviation.  THROAT:  Without lesion.  NECK:  Supple without thyromegaly.  There is no supraclavicular or  axillary adenopathy.  CHEST:  Clear to auscultation and percussion.  HEART:  Regular sinus rhythm.  No murmurs.  ABDOMEN:  Soft.  No hepatosplenomegaly.  EXTREMITIES:  Pulses are 2+.  There is no clubbing or edema.  NEUROLOGICAL:  He is oriented x3.  His sensory and motor are intact.  Cranial nerves are intact.   Pulmonary test showed an FVC of 3.97 with FEV1 at 3.23.   IMPRESSION:  1. Multiple pulmonary nodules.  2. Coronary artery disease with mitral valve regurgitation.  3. Depression.  4. Chronic back pain.  5. Dyspepsia.  6. Dyslipidemia.  7. Chronic obstructive pulmonary disease.  8. Ongoing tobacco abuse.   PLAN:  Right VATS and biopsy of multiple lung nodules.      Ines Bloomer, M.D.  Electronically Signed     DPB/MEDQ  D:  10/09/2007  T:  10/10/2007  Job:  [id number]

## 2010-10-05 NOTE — Assessment & Plan Note (Signed)
NAMESHALAMAR, CRAYS               CHART#:  [id number]   DATE:  05/28/2007                       DOB:  [birth year]   CHIEF COMPLAINT:  Annual follow up.   SUBJECTIVE:  Mr. Fier is a 60 year old male with a history of chronic  GERD and was extensively evaluated previously by Dr. Jena Gauss for weight  loss. He also has a history of intermittent hematochezia secondary to  hemorrhoids. He was last seen June 19, 2006. He presents today for  followup. His weight is steadily increasing. He has gained almost six  pounds in the last year. He denies any abdominal pain at this time. He  has noticed some loose stools, anywhere between one and five per day for  the last several months. He was recently on antibiotics about a month  ago for a respiratory infection. He denies any nausea or vomiting.  Denies any fever or chills. He does continue to smoke cigarettes. He is  on omeprazole 20 mg daily for his chronic GERD symptoms which are well-  controlled at this time. He has a history of alternating between  constipation and diarrhea. He was recently diagnosed with a leaky heart  valve and is being followed by cardiologist for this.  Mr. Petrosyan has  had longterm depression after the loss of his wife and child as well.   CURRENT MEDICATIONS:  See the list from May 28, 2007.   ALLERGIES:  No known drug allergies.   OBJECTIVE:  VITAL SIGNS: Weight: 170 pounds, height 66 inches,  temperature 98.1, blood pressure 130/80, pulse 60.  GENERAL: Is a well-developed, well-nourished Caucasian male in no acute  distress.  HEENT: Sclerae are clear and anicteric. Conjunctivae pink. Oropharynx  pink and moist without any lesions.  NECK: Supple without lymphadenopathy or thyromegaly.  CHEST: HEART: Regular rate and rhythm. Normal S1, S2. Without murmurs,  clicks, rubs or gallops.  ABDOMEN: Positive bowel sounds in all four quadrants. No bruits  auscultated. Soft and nontender, nondistended without palpable  mass or  hepatosplenomegaly. No rebound, tenderness or guarding.  EXTREMITIES: Without clubbing or edema bilaterally.   ASSESSMENT:  1. Mr. Longsworth is a 60 year old Caucasian male with gastroesophageal      reflux disease well-controlled on proton pump inhibitor.  2. History of weight loss. He is actually gaining weight at this point      and this is no longer an issue.  3. He has noticed diarrhea over the last several months with a recent      antibiotic use. Given his history of constipation, I wonder if he      has mixed irritable bowel syndrome. However, given recent      antibiotic use, C-difficile and pseudo-membranous colitis should      remain in the differential as well.   PLAN:  1. He is to begin daily fiber supplement of choice. I have given him      Benefiber samples today.  2. Continue omeprazole 20 mg daily #31 with eleven refills.  3. Begin Align probiotics once daily.  4. Stool studies to include C-difficile, ova and parasites, culture      and sensitivity and lactoferrin.  5. Screening colonoscopy in November of 2017.   ADDENDUM:  Doristine Church will call with stool results. OPV with RMR/KJ.       Lorenza Burton, N.P.  Electronically Signed     Kassie Mends, M.D.  Electronically Signed    KJ/MEDQ  D:  05/28/2007  T:  05/28/2007  Job:  [id number]   cc:   Patrica Duel, M.D.

## 2010-10-05 NOTE — Assessment & Plan Note (Signed)
OFFICE VISIT   [name], [name]  DOB:  10/09/1950                                        October 25, 2007  CHART #:  [id number]   HISTORY:  [name] is now here for his one-week office visit  following his recent thoracic surgical procedure.  He underwent a right  video- assisted thoracoscopy for resection of multiple pulmonary  nodules.  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  This was discussed with the patient and its clinical  correlation with tobacco use.  Overall the patient reports that he has  been doing well following the surgery.  He has mild discomfort.  It was  noted that he developed some purulent-type drainage from his chest tube  site.  He saw his family physician for this and was treated with a 2-day  course of IV Rocephin and also started on oral Bactrim.  He denies  fevers or other significant constitutional symptoms.   Chest x-ray was reviewed on today's date and reveals improvement in his  lung opacities.  In particular, the site near the surgical staples shows  improvement of the aeration.  There is no acute finding on the x-ray.   PHYSICAL EXAMINATION:  Vital signs:  Blood pressure 107/67, pulse 62,  respirations 20, oxygen saturation 99%.  General:  A well-appearing  adult male in no acute distress.  Incisions:  The chest tube site does  reveal a small amount of purulence and surrounding erythema along the  suture line.  There is no obvious deep abscess.  Note I did remove the  chest tube sutures at this time as they may be nidus the infection.  Lung examination revealed clear breath sounds without wheeze or rhonchi.  Cardiac examination:  Regular rate and rhythm.   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  These  findings correlate clinically with significant tobacco abuse.   PLAN:  We will see him in one week to reassess his chest tube site.  He  is to continue on his  current antibiotics.  Additionally, we recommend  followup with pulmonologist for long-term management of his COPD and  this new finding.  Additionally, we counseled him on the ongoing need  for smoking cessation and the possible reversible pattern that may exist  in his condition with the discontinuation of the cigarettes.  This is  certainly uncertain with the severe and chronic findings on his  pathology report.   Rowe Clack, P.A.-C.   Sherryll Burger  D:  10/25/2007  T:  10/25/2007  Job:  [id number]   cc:   Ines Bloomer, M.D.  Patrica Duel, M.D.

## 2010-10-05 NOTE — Assessment & Plan Note (Signed)
HEALTHCARE                             PULMONARY OFFICE NOTE   NAME:[name], [name]                     MRN:          [id number]  DATE:03/20/2007                            DOB:          November 21, 1950    SLEEP MEDICINE CONSULTATION   HISTORY OF PRESENT ILLNESS:  The patient is a 60 year old gentleman who  I have been asked to see for obstructive sleep apnea.  The patient  states that he underwent nocturnal polysomnography approximately two  years ago and was diagnosed with unknown severity of obstructive sleep  apnea.  He was apparently started on CPAP and has been having great  difficulty with this.  Currently the patient states that he will get to  sleep with CPAP very easily but will awaken and pull it off during the  night and feels that he cannot get back to sleep with the device.  He  feels that he does not get enough air.  The patient typically goes to  bed between 11 and 12 and gets up at 6 A.M. to start his day. He is not  rested upon arising.  Whenever he does not wear CPAP he has definite  snoring and pauses in his breathing during sleep as well sa gasping  episodes according to the family.  The patient is unclear how much  pressure he is on currently by CPAP but he does use a nasal mask.  He  does have a heated humidifier.  The patient has not been working, but  does have occasional sleep pressure with periods of inactivity during  the day.  He has some dozing with TV and some sleepiness with driving.  He states that he would definitely like to sleep better and have more  alertness during the day.  The patient states that he has also lost  about 45 pounds over the last one year.   PAST MEDICAL HISTORY:  1. Significant for hypertension.  2. History of asthma.  3. History of dyslipidemia.  4. History of allergic rhinitis.  5. Status post cholecystectomy.   CURRENT MEDICATIONS:  1. Omeprazole 20 mg daily.  2. Metoprolol 25 mg daily.  3. Simvastatin 20 mg q.h.s.  4. Tricor 145 q.h.s.  5. Diovan 80 mg q.a.m.  6. Aspirin 81 mg q.a.m.  7. Trazodone 100 mg q.h.s.  8. Lexapro 10 mg q.a.m.  9. Multivitamins daily.  10.Alprazolam 1 mg q.8h. p.r.n.  11.Hydrocodone/APAP 10/650 one q.4h. p.r.n.   ALLERGIES:  The patient has no known drug allergies.   SOCIAL HISTORY:  He is single and does have children.  He has a history  of smoking one pack per day for 30 years.  He continues to smoke.   FAMILY HISTORY:  Remarkable for his father having emphysema as well as  heart disease.  His mother also had heart disease.   REVIEW OF SYSTEMS:  As per history of present illness, also see patient  intake form documented on the chart.   PHYSICAL EXAMINATION:  GENERAL APPEARANCE:  He is a thin, white male in  no acute distress.  VITAL SIGNS:  Blood pressure 106/68. Pulse 58.  Temperature 97.9.  Weight is 175 pounds. He is 5 feet, 6 inches tall.  Oxygen saturation on  room air is 100%.  HEENT:  Pupils equal, round, reactive to light and accommodation.  Extraocular muscles are intact.  Nares are somewhat narrowed but patent.  Oropharynx does show mild elongation of soft palate and uvula.  NECK:  Supple without jugular venous distention or lymphadenopathy.  There is no palpable thyromegaly.  CHEST:  Is totally clear except for a few rhonchi.  CARDIOVASCULAR:  Exam reveals a regular rate and rhythm.  ABDOMEN:  Soft, nontender with good bowel sounds.  GENITOURINARY/RECTAL/BREAST:  Exams not done, not indicated.  EXTREMITIES:  Lower extremities are without edema.  Pulses are intact  distally.  NEUROLOGICAL:  He is alert and oriented with no obvious motor deficits.   IMPRESSION:  History of obstructive sleep apnea for which he has been  prescribed CPAP.  I do not have the data available in terms of the  severity of his apnea nor what his CPAP machine is set on.  Based on  what he is describing, it sounds as though he may not be getting  enough  pressure and that may be leading to poor tolerance during the middle of  the night when he awakens.  I question, since the patient has lost 45  pounds, if he still has obstructive sleep apnea and even needs CPAP.  He  states he still has symptoms during the night whenever he doesn't wear  it, and according to his family, has snoring, pauses and gasps.  He is  clearly sleepy during the day but he is also taking a lot of narcotics  and sedative hypnotics as well as other medications that can interfere  with his sensorium.  At this point in time, I would like to try to get  his old sleep study but I would also like to place him on an auto device  for the next four weeks with download to try to get some idea how much  sleep apnea he may be actually having and what may be his optimal  pressure.  The patient is agreeable to this approach.   PLAN:  1. Auto-titrate device for the next four weeks with download at that      time.  He is to follow up      with me at five weeks.  2. Will need his old sleep study from Dr. Gerilyn Pilgrim.     Barbaraann Share, MD,FCCP  Electronically Signed    [facility]MC/MedQ  DD: 03/21/2007  DT: 03/21/2007  Job #: [id number]   cc:   Dani Gobble, MD

## 2010-10-05 NOTE — Assessment & Plan Note (Signed)
OFFICE VISIT   [name], [name]  DOB:  05/02/1951                                        November 22, 2007  CHART #:  [id number]   The patient came for followup today.  His incisions are well healed.  His blood pressure was 124/86, pulse 68, respirations 18, and sats were  97%, minimal chest tube, and overall, he is doing well.  We will see him  back again in 6 weeks with another chest x-ray.   Ines Bloomer, M.D.  Electronically Signed   DPB/MEDQ  D:  11/22/2007  T:  11/22/2007  Job:  [id number]

## 2010-10-08 NOTE — Op Note (Signed)
[facility]  Patient:    SNYDER, COLAVITO Visit Number: [id number] MRN: [id number]          Service Type: END Location: DAY Attending Physician:  [name] Dictated by:   Roetta Sessions, M.D. Proc. Date: 10/29/01 Admit Date:  10/29/2001 Discharge Date: 10/29/2001                             Operative Report  PROCEDURE:  Esophagogastroduodenoscopy with [facility] dilation.  ENDOSCOPIST:  Roetta Sessions, M.D.  INDICATION FOR PROCEDURE:  The patient is a 60 year old gentleman with recurrent esophageal dysphagia.  EKG is now being done to further evaluate his dysphagia symptoms.  This approach has been discussed with the patient at the bedside and potential risks, benefits and alternatives have been reviewed and questions answered; he is agreeable.  Please see my handwritten H&P for more information.  DESCRIPTION OF PROCEDURE:  O2 saturation, blood pressure, pulse and respirations were monitored throughout the entire procedure.  CONSCIOUS SEDATION:  Versed 5 mg IV and Demerol 100 mg IV divided doses.  INSTRUMENT:  Olympus video chip gastroscope.  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  EG junction was easily traversed.  Stomach:  Gastric cavity was emptied and insufflated well with air.  Thorough examination of the gastric mucosa including a retroflexed view of the proximal stomach and esophagogastric junction demonstrated only a small hiatal hernia. Pylorus was patent and easily traversed.  Duodenum:  The bulb and second portion appeared normal.  THERAPEUTIC/DIAGNOSTIC MANEUVERS PERFORMED:  A 58-French Maloney dilator and subsequently a 60-French Maloney dilator were passed to full insertion without resistance and without blood returned on the dilator.  A look back into the esophagus and stomach revealed the ring had been ruptured without  apparent complication.  The patient tolerated the procedure well and was reacted at endoscopy.  IMPRESSION: 1. Schatzkis ring, otherwise, normal esophagus. 2. Small hiatal hernia. 3. Remainder of stomach and duodenum through the second portion appeared    normal.  RECOMMENDATIONS: 1. Continue Nexium 40 mg orally daily for gastroesophageal reflux disease. 2. The patient is to call me if he has any trouble swallowing in the future. Dictated by:   Roetta Sessions, M.D. Attending Physician:  [name] DD:  11/20/01 TD:  11/23/01 Job: [id number]

## 2010-10-08 NOTE — Op Note (Signed)
NAME:  [name], [name]              ACCOUNT NO.:  [id number]   MEDICAL RECORD NO.:  [id number]          PATIENT TYPE:  AMB   LOCATION:  DAY                           FACILITY:  [facility]   PHYSICIAN:  R. Roetta Sessions, M.D. DATE OF BIRTH:  [birth year]   DATE OF PROCEDURE:  12/14/2004  DATE OF DISCHARGE:                                 OPERATIVE REPORT   PROCEDURE:  Esophagogastroduodenoscopy with [facility] Hashimoto dilation.   INDICATIONS FOR PROCEDURE:  A 60 year old gentleman with a history of a  Schatzki's ring, now with report of recurrent esophageal dysphagia. EGD is  now being done. This approach has been discussed with the patient at length.  Potential risks, benefits, and alternatives have been reviewed and questions  answered. He is agreeable. Please see documentation in the medical record.   PROCEDURE NOTE:  O2 saturation, blood pressure, pulse, and respirations were  monitored throughout the entire procedure. Conscious sedation with Versed 7  mg IV and Demerol 150 mg IV in divided doses.   INSTRUMENT:  Olympus video chip system.   FINDINGS:  Examination of the tubular esophagus revealed no esophagitis.  There was what appeared to be a very noncritical stricture/ring at the EG  junction. There was no Barrett's esophagus. The scope was easily passed  across this area.   Stomach:  Gastric cavity was empty and insufflated well with air. Thorough  examination of gastric mucosa including retroflexed view of the proximal  stomach and esophagogastric junction demonstrated no abnormalities. Pylorus  was patent and easily traversed. Examination of bulb and second portion  revealed no abnormalities.   THERAPEUTIC/DIAGNOSTIC MANEUVERS:  A 56-French Maloney dilator was passed to  full insertion with good patient tolerance ____________ dilator. Look back  revealed no apparent complication related to passive of the dilator. The  patient tolerated the procedure well and was reactive to endoscopy.   IMPRESSION:  1.  Noncritical appearing Schatzki's ring/benign appearing peptic stricture.      Otherwise normal esophagus status post dilation as described above.  2.  Normal stomach, normal D1 and D2.   RECOMMENDATIONS:  Continue Nexium 40 mg orally daily. He is to follow up  with [place] in three to four weeks to see how he is doing.       RMR/MEDQ  D:  12/14/2004  T:  12/14/2004  Job:  [id number]   cc:   Patrica Duel, M.D.  [address]  [place]  [place] [address]  Fax: [phone]

## 2010-10-08 NOTE — Op Note (Signed)
NAMEVALERIE, FREDIN              ACCOUNT NO.:  [id number]   MEDICAL RECORD NO.:  [id number]          PATIENT TYPE:  OIB   LOCATION:  2853                         FACILITY:  [facility]   PHYSICIAN:  Cristy Hilts. Jacinto Halim, MD       DATE OF BIRTH:  06-04-1950   DATE OF PROCEDURE:  DATE OF DISCHARGE:                                 OPERATIVE REPORT   No dictation for this job number.      Cristy Hilts. Jacinto Halim, MD     JRG/MEDQ  D:  02/17/2005  T:  02/17/2005  Job:  [id number]

## 2010-10-08 NOTE — Cardiovascular Report (Signed)
NAMEELSIE, SAKUMA              ACCOUNT NO.:  [id number]   MEDICAL RECORD NO.:  [id number]          PATIENT TYPE:  OIB   LOCATION:  2853                         FACILITY:  [facility]   PHYSICIAN:  Cristy Hilts. Jacinto Halim, MD       DATE OF BIRTH:  1951/01/19   DATE OF PROCEDURE:  02/17/2005  DATE OF DISCHARGE:                              CARDIAC CATHETERIZATION   REFERRING PHYSICIAN:  Dr. Patrica Duel and Dr. Kem Boroughs   PROCEDURES PERFORMED:  1.  Left heart catheter including left ventriculography.  2.  Selective right and left coronary arteriography.  3.  Abdominal aortogram.  4.  Right femoral angiography and closure of right femoral artery access      with StarClose.   INDICATIONS:  Mr. Mandich is a 60 year old gentleman with history of  hypertension, hyperlipidemia, continued smoking, strong family history of  premature coronary heart disease.  Had been complaining of shortness of  breath and exertional chest discomfort.  He had negative dobutamine  Cardiolite stress test performed recently on February 03, 2005 but in spite  of this given recurrent chest discomfort he was brought to the cardiac  catheterization laboratory for a definitive diagnosis of coronary disease.   HEMODYNAMIC DATA:  The left ventricular pressures were 125/8 with an end-  diastolic pressure of 16 mmHg.  The aortic pressures were 120/79 with a mean  of 97 mmHg.  There was no pressure gradient across the aortic valve.   ANGIOGRAPHIC DATA:  1.  Left ventricle:  Left ventricular systolic function was normal and      ejection fraction was estimated at 60%.  There was no regional wall      motion abnormality.  2.  Right coronary artery:  Right coronary artery is a nondominant vessel.      It is normal.  3.  Left main:  Left main is short, large caliber vessel.  It is normal.  4.  Circumflex:  Circumflex is a large caliber vessel.  It is a dominant      vessel.  It has mild luminal irregularity in its proximal  and mid      segment.  It gives origin to a large OM1 and moderate size OM2 and      concludes as a PDA and a PLA branch.  5.  LAD:  LAD is a large caliber vessel.  Gives origin to a very large      diagonal 1 in its proximal segment and just after the origin of diagonal      1 in the mid segment there is a 40% eccentric hazy stenosis.  Otherwise,      the LAD has mild luminal irregularity.   IMPRESSION:  No significant coronary artery disease.  There is moderate  plaque noted in the mid left anterior descending which is very focal, mild  irregularity of the circumflex coronary artery, dominant circumflex coronary  artery.   RECOMMENDATIONS:  Given a negative dobutamine Cardiolite stress test,  continued risk factor modification is indicated, especially smoking.  I met  with him and his girlfriend and explained to  him regarding the critical  nature of his plaque morphology in the proximal to mid segment of the LAD  and he appears to be very motivated for smoking cessation.   HE WILL NEED A REPEAT CARDIOLITE IN SIX MONTHS TO 12 MONTHS AND HE WILL  PROBABLY NEED SOME SURVEILLANCE REGARDING THIS MID LAD STENOSIS.   Patient will follow up with Dr. Kem Boroughs and Dr. Patrica Duel.   TECHNIQUE OF PROCEDURE:  Under usual sterile precautions using a 6-French  right femoral arterial access, 6-French multipurpose BD catheter was  advanced to the ascending aorta with 0.035 [place] J-wire.  The catheter was  gently advanced to the left ventricle.  Left ventricular pressures were  monitored.  Hand contrast into the left ventricle was performed both in LAO  and RAO projection.  Catheter was flushed with saline and pulled back into  the ascending aorta and pressure gradient across the aortic valve was  monitored.  Right coronary artery was selectively engaged.  Angiography was  performed.  Then the catheter was utilized to engage left main coronary  artery and angiography was repeated.  This  same catheter was pulled back  into the abdominal aorta and angiography was repeated.  Also, abdominal  aortogram was performed.  Then, a 6-French JL4 diagnostic catheter was  utilized to engage the left main coronary artery and angiography was  repeated.  Then, the catheter was pulled out of the body in the usual  fashion.  Patient tolerated procedure well.  A total of 110 mL to 115 mL of  contrast was utilized for diagnostic angiography.  No immediate  complications noted.  Right femoral angiography was performed through the  arterial access sheath and access was closed with StarClose with excellent  hemostasis.      Cristy Hilts. Jacinto Halim, MD  Electronically Signed     JRG/MEDQ  D:  02/17/2005  T:  02/17/2005  Job:  [id number]   cc:   Dani Gobble, MD  Fax: [phone]

## 2010-10-08 NOTE — Op Note (Signed)
[name], [name]              ACCOUNT NO.:  [id number]   MEDICAL RECORD NO.:  [id number]          PATIENT TYPE:  AMB   LOCATION:  DAY                           FACILITY:  [facility]   PHYSICIAN:  R. Roetta Sessions, M.D. DATE OF BIRTH:  May 25, 1950   DATE OF PROCEDURE:  04/06/2006  DATE OF DISCHARGE:                                 OPERATIVE REPORT   EGD with biopsy and colonoscopy with biopsy.   INDICATIONS FOR PROCEDURE:  54-year gentleman with 30 pounds weight loss,  early satiety, anorexia, intermittent hematochezia.  EGD and colonoscopy now  being done.  This approach discussed with the patient at length.  Potential  risks, benefits and alternatives have been reviewed, questions answered,  agreeable.  Please see documentation the medical record.   PROCEDURE NOTE:  O2 saturation, blood pressure, pulse respirations monitored  throughout entire procedure.   CONSCIOUS SEDATION BOTH PROCEDURES:  Versed 4 mg, IV Demerol 100 mg IV slow  IV push.  Phenergan 25 mg diluted slow IV push.  Cetacaine spray topical  pharyngeal anesthesia.   FINDINGS:  EGD examination of esophagus revealed no mucosal abnormalities.  EG junction easily traversed.  Stomach:  Gastric cavity was empty, insufflated well with air.  Thorough  examination gastric mucosa including retroflexion proximal stomach,  esophagogastric junction demonstrated a small hiatal hernia and some  somewhat adenomatous mucosa up in the body.  The remainder the gastric  mucosa appeared entirely normal.  Pylorus patent, easily traversed.  Examination bulb, second portion revealed no abnormalities.  The adenomatous  appearing body mucosa was biopsied for histologic study.  Also biopsies of  the duodenum were taken histologic study. The patient tolerated the  procedure well was prepared for colonoscopy.   Digital rectal exam revealed no abnormalities.   ENDOSCOPIC FINDINGS:  The prep was good.  Rectum:  Examination rectal mucosa  including retroflex view anal verge  __________ anal canal demonstrated friable anal canal tissue. Otherwise  rectal mucosa appeared normal.  Colon:  Colonic mucosa was surveyed from rectosigmoid junction through the  left, transverse, right colon, area of appendiceal orifice, ileocecal valve  and cecum.  These structures well seen.  Photographed for record.  Terminal  ileum was intubated 5 cm.  From this level scope slowly cautiously  withdrawn.  All previously mentioned mucosal surfaces were again seen.  The  patient was noted to have a normal terminal ileum.  The colonic mucosa  appeared normal as well.  There was a diminutive 3-4 mm polyp in the  proximal rectum 15 cm which was cold biopsy/removed.  The remainder of the  rectal colonic mucosa appeared normal.  The patient tolerated both  procedures well, was reacted endoscopy.   IMPRESSION:  EGD small hiatal hernia, otherwise normal esophagus,  adenomatous appearing gastric mucosa in the area of the body.  This may be a  variant of normal and of doubtful clinical significance at this point time.  Biopsied.  Remainder stomach appeared normal.  Normal D1-D2. Biopsies of  D2  taken.   COLONOSCOPY FINDINGS:  1. Friable anal canal, likely source of patient's low volume  hematochezia.      Diminutive rectal polyp, status post cold biopsy/removal, otherwise      normal rectum.  2. Normal colon.  Terminal ileum.   RECOMMENDATIONS:  1. Will follow up on path.  2. Anusol HC suppositories one per rectum bedtime next 10 days.  3. Hemorrhoid literature provided Mr. Rushlow.   I feel that Mr.  [name] has been battling long-term depression with loss of  his wife and child.  This may be in part contributing to his GI symptoms.  His weight loss and early satiety need further evaluation.  Will proceed  with a CT of the abdomen and pelvis and IV and oral contrast very near  future follow-up on biopsies.   ADDENDUM:  From 03/28/2006 through our  office CBC white count 10.42m H&H  16.5, 46.9.   Further recommendations to follow.      [name], M.D.  Electronically Signed     RMR/MEDQ  D:  04/06/2006  T:  04/06/2006  Job:  [id number]

## 2010-10-08 NOTE — Consult Note (Signed)
NAME:  [name], [name]              ACCOUNT NO.:  [id number]   MEDICAL RECORD NO.:  [id number]          PATIENT TYPE:  AMB   LOCATION:  DAY                           FACILITY:  [facility]   PHYSICIAN:  R. Roetta Sessions, M.D. DATE OF BIRTH:  August 08, 1950   DATE OF CONSULTATION:  11/24/2004  DATE OF DISCHARGE:                                   CONSULTATION   REASON FOR CONSULTATION:  Esophageal dysphagia.   HISTORY OF PRESENT ILLNESS:  [name] is a pleasant, 60 year old  Caucasian male referred at the courtesy of Dr. Patrica Duel to further  evaluate a 8-9 month history of intermittent esophageal dysphagia,  __________ is unknown.  History of a Schatzki ring reported dilatation, by  me, back in June 2003.  His reflux symptoms have been fairly well controlled  with Nexium 40 mg orally daily, although he does have some occasional lower  retrosternal chest discomfort with certain foods. He has not really lost any  weight. No melena or rectal bleeding.  He had a colonoscopy for Hemoccult-  positive stool back in 2000/10/27.  He is not due for screening until 10-28-10.  He  does smoke 1-1/2 packs of cigarettes per day.  He does not consume alcohol.   PAST MEDICAL HISTORY:  Hypertension, GERD, COPD, anxiety, __________  evidence of depression, hypercholesterolemia, and chronic back pain.   PAST SURGERY:  Cholecystectomy, tonsillectomy, inguinal hernia repair.   CURRENT MEDICATIONS:  1.  Xanax 1 tablet t.i.d. dose unknown.  2.  Hydrocodone p.r.n.  3.  Nexium 40 mg orally daily.  4.  Diovan 160 mg daily.  5.  Tricor 145 mg daily.  6.  Lexapro 10 mg daily.  7.  Albuterol inhaler p.r.n.  8.  Oxycodone 500/5 p.r.n.   ALLERGIES:  No known drug allergies.   SOCIAL HISTORY:  He is widowed.  His wife died in 27-Oct-2000.  A 76 year old son,  was hit by a truck.  He has a 73 year old son who lives in [place].  Patient is disabled in the base of his back.  Again, he smokes 1-1/2 packs  of cigarettes per  day. No alcohol.   REVIEW OF SYSTEMS:  No recent chest pain on exertion.  No fever or chills.  Otherwise as in history of present illness.   PHYSICAL EXAMINATION:  GENERAL:  Reveals a pleasant, 60 year old gentleman  resting comfortably.  VITAL SIGNS:  Weight 192, height 5 feet 6 inches.  Temperature 97.9, BP  132/80, pulse 78.  SKIN:  Warm and dry.  No jaundice.  No cutaneous stigmata of chronic liver  disease.  HEENT:  No scleral icterus.  NECK:  JVD is not prominent.  CHEST:  Lungs are clear to auscultation.  CARDIAC:  Regular rate and rhythm without murmur, gallop, or rub.  ABDOMEN:  Nondistended, positive bowel sounds.  Soft and nontender without  appreciable mass or organomegaly.   IMPRESSION:  [name] is a pleasant 60 year old gentleman with a  history of a Schatzki ring who now reports recurrent esophageal dysphagia.  He does have some lower retrosternal chest discomfort with swallowing. I  suspect the ring has tightened up.  He may or may not have esophagitis.  Smoking, however, does put him at risk for esophageal neoplasia.  At any  rate, he needs to have another endoscopy.   PLAN:  I have offered [name] an EGD with probable esophageal dilatation  as appropriate.  The potential risks, benefits, and alternatives have been  reviewed.  We will plan to perform EGD with possible esophageal dilation in  the near future at [facility].   Further recommendations to follow.   I would like to thank Dr. Patrica Duel for allowing me to see this nice  gentleman, once again.       RMR/MEDQ  D:  11/24/2004  T:  11/24/2004  Job:  [id number]   cc:   Patrica Duel, M.D.  [address], [address]  [place]  [place] [address]  Fax: [phone]   R. Roetta Sessions, M.D.  [address]  [place]  [place] [id number]

## 2010-10-08 NOTE — H&P (Signed)
NAMEMERCED, HANNERS              ACCOUNT NO.:  [id number]   MEDICAL RECORD NO.:  [id number]          PATIENT TYPE:  AMB   LOCATION:  DAY                           FACILITY:  [facility]   PHYSICIAN:  R. Roetta Sessions, M.D. DATE OF BIRTH:  11-03-1950   DATE OF ADMISSION:  DATE OF DISCHARGE:  LH                                HISTORY & PHYSICAL   History and physical from March 27, 2006.   CHIEF COMPLAINT:  Significant weight loss, hematochezia, and anorexia.   HISTORY OF PRESENT ILLNESS:  [name] is a 59 year old Caucasian male who  has been followed by Dr. Jena Gauss for a history of chronic GERD and IBS.  He  presents today for evaluation of significant unintentional weight loss.  He  tells me he had lost 30 pounds in the last year, and this is documented in  our records.  He tells me he has anorexia.  He is only eating 1 or 2 meals a  day.  He denies any abdominal pain, nausea or vomiting.  He is having a  bowel movement every day or every other day and tells me he has noticed a  small amount of rectal bleeding on the toilet tissue.  He complains of  chronic back pain, which is severe.  His pain is in the low back.  He tells  me the pain usually eases after defecation.  He denies any heartburn or  indigestion, denies any dysphagia or odynophagia.  He is on aspirin 81 mg  daily.  Most recent labs are from Sep 22, 2005.  He had a white blood cell  count of 11.3, hemoglobin of 16.1, hematocrit of 46.3, and platelet count of  249.  His TSH was normal at that time.  His wife passed away in 03-Oct-2000.   PAST MEDICAL HISTORY:  He has a history of chronic GERD, a noncritical  Schatzki ring.  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 Jena Gauss on December 14, 2004.  He has had a  colonoscopy by Dr. Jena Gauss on December 19, 2000, which was normal.  He also had a  normal EGD at the same time.  He has a history of iron-deficiency anemia.  He has hypertension and GERD,  COPD, anxiety, depression,  hypercholesterolemia and chronic back pain.  Status post cholecystectomy,  tonsillectomy, inguinal hernia repair.   CURRENT MEDICATIONS:  1. Diovan 80 mg daily.  2. Lexapro 20 mg daily.  3. Tricor 145 mg daily.  4. Hydrocodone 10/650 mg q.4h. p.r.n.  5. Xanax 3-4 mg daily.  6. Albuterol 90 mcg two puffs q.6h. p.r.n.  7. Albuterol nebulizer p.r.n.  8. CPAP at bedtime.  9. Tizanidine 4 mg t.i.d.  10.Metoprolol 12.5 mg b.i.d.  11.Aspirin 81 mg daily.  12.Fish oil 2 g daily.  13.Trazodone 100 mg q.h.s.  14.Singular 10 mg daily.  15.Simvastatin 20 mg q.h.s.  16.Omeprazole 20 mg daily.   ALLERGIES:  No known drug allergies.   FAMILY HISTORY:  There is no known family history of colorectal carcinoma or  liver or  chronic GI problems.   SOCIAL HISTORY:  [name] is a widower.  His wife died in Oct 08, 2000.  He is  disabled.  He has significant tobacco use, smoking about a pack and a half  per day and denies any alcohol or drug use.   REVIEW OF SYSTEMS:  CONSTITUTIONAL:  See HPI.  Denies any fever or chills.  He is complaining of some fatigue.  CARDIOVASCULAR:  Denies any history of  palpitations.  PULMONARY:  Denies shortness of breath, dyspnea, cough or  hemoptysis.  He did have a chest CT which showed peribronchial thickening,  questionable bronchitis, and a single mildly prominent right hilar lymph  node on December 15, 2004.  We had asked him to follow up with Dr. Nobie Putnam at  that time.  GASTROINTESTINAL:  See HPI.   PHYSICAL EXAMINATION:  VITAL SIGNS:  Weight 164.5 pounds.  Height 66 inches.  Temperature 97.8, blood pressure 118/62 and pulse 64.  GENERAL:  [name] is a 60 year old Caucasian male who is alert and  oriented, pleasant and cooperative, in no acute distress.  HEENT:  Sclerae are clear, nonicteric.  Conjunctivae pink.  Oropharynx pink  and moist without lesions.  NECK:  Supple without mass or thyromegaly.  CHEST:  Heart regular rate and  rhythm, normal S1, S2, without murmurs,  clicks, rubs or gallops.  Lungs clear to auscultation bilaterally.  ABDOMEN:  Positive bowel sounds x4, no bruits auscultated.  Soft, nontender,  nondistended, with no palpable mass or hepatosplenomegaly.  No rebound  tenderness or guarding.  EXTREMITIES:  Without clubbing or edema bilaterally.  He does have multiple  ecchymotic areas to upper and lower extremities.  SKIN:  As described above.   IMPRESSION:  [name] is a 60 year old Caucasian male with a significant  30-pound weight loss in the last year.  He complains of anorexia and is only  able to consume 1-2 meals per day.  He has a history of chronic  gastroesophageal reflux disease and Schatzki ring.  He tells me his symptoms  are well-controlled at this time.  His weight loss is definitely worrisome,  and I feel he needs further evaluation to rule out peptic ulcer disease or  occult malignancy.  He also has small-volume hematochezia on several  occasions along with chronic back pain, which is relieved post defecation.  His last colonoscopy was over 5 years ago.  Therefore, we will repeat  colonoscopy at this time given his significant weight loss.   PLAN:  1. Colonoscopy and EGD with Dr. Jena Gauss in the near future.  I discussed the      procedure, including the risks and benefits to include but not limited      to bleeding, infection, perforation, drug reaction, and he agrees.  A      signed consent will be obtained.  2. Repeat CBC.  3. He is to follow up with Dr. Nobie Putnam regarding his abnormal chest CT      from last year to determine whether he will need follow-up on this,      although he denies any pulmonary problems at this time.      Nicholas Lose, N.P.      [name], M.D.  Electronically Signed    KC/MEDQ  D:  03/27/2006  T:  03/28/2006  Job:  [id number]   cc:   Patrica Duel, M.D. Fax: [phone]

## 2010-10-08 NOTE — Op Note (Signed)
[facility]  Patient:    [name], [name] Visit Number: [id number] MRN: [id number]          Service Type: END Location: DAY Attending Physician:  [name] Dictated by:   Roetta Sessions, M.D. Admit Date:  10/29/2001 Discharge Date: 10/29/2001   CC:         Patrica Duel, M.D.   Operative Report  PROCEDURE:  Esophagogastroduodenoscopy with [facility] dilation.  ENDOSCOPIST:  Roetta Sessions, M.D.  INDICATIONS FOR PROCEDURE:  Patient is a 60 year old gentleman with recurrent esophageal dysphagia.  He has gastroesophageal reflux disease which is well controlled on Nexium 40 mg orally daily.  This gentleman also has a history of esophageal dysphagia.  I last performed an EGD back in August 2002 although I did not find any structural abnormality.  I passed a 66 Jamaica Maloney dilator.  This helped his dysphagia until just the last several weeks.  EGD is now being done to reassess his recurrent dysphagia symptoms and perform dilation as appropriate.  This approach has been discussed with the patient. At the bedside, the potential risks, benefits, and alternatives have been reviewed and questions answered.  He is agreeable.  The patient is low risk for conscious sedation.  Please see my handwritten H&P for more information.  DESCRIPTION OF PROCEDURE:  O2 saturation, blood pressure, pulse, and respirations were monitored throughout the entire procedure.  CONSCIOUS SEDATION: 1. Versed 5 mg IV in divided doses. 2. Demerol 100 mg IV in divided doses.  INSTRUMENT:  Olympus video chip gastroscope.  FINDINGS:  Examination of the tubular esophagus initially revealed no abnormalities.  EG junction was easily traversed.  Stomach:  The gastric cavity insufflated well with air.  Thorough examination of the gastric mucosa including a retroflexed view of the proximal stomach, esophagogastric junction demonstrated only a small hiatal hernia.  Pylorus was patent  and easily traversed.  Duodenum:  The bulb and second portion appeared normal.  I pulled the scope back into the esophagus and, as I did so, the patient belched a large amount of air at that point in time.  A Schatzkis ring was displayed (please see photos).  Subsequently, the scope was withdrawn and a 36 Jamaica Maloney dilator was passed to full insertion with ease.  Subsequently, a 60 Jamaica Maloney dilator was passed with full insertion, only slight resistance upon full insertion.  A look back revealed that the ring had been ruptured.  The patient tolerated the procedure well and was reactive in endoscopy.  IMPRESSION: 1. Schatzkis ring status post dilation as described above. 2. Small hiatal hernia.  Remainder of esophagus, stomach, and duodenum through    the second portion appeared normal.  The patient tells me that, where he works, he is forced to eat his food very quickly within 10 to 20 minutes.  He is probably not swallowing his food nor is he chewing it adequately.  RECOMMENDATIONS: 1. Continue Nexium 40 mg orally daily. 2. He is urged to chew his food thoroughly and have liquids on hand to assist    with his swallowing process. 3. He should really try to take 30 minutes to consume his meals. 4. Timing of any future endoscopy and dilation would be determined by    recurrence of symptoms.  He is to follow up with Dr. Nobie Putnam. Dictated by:   Roetta Sessions, M.D. Attending Physician:  [name] DD:  10/29/01 TD:  10/31/01 Job: [id number]

## 2010-11-08 ENCOUNTER — Other Ambulatory Visit: Payer: Self-pay

## 2010-11-08 NOTE — Telephone Encounter (Signed)
What pharmacy ?

## 2010-11-09 MED ORDER — OMEPRAZOLE 20 MG PO CPDR: 20.0000 mg | DELAYED_RELEASE_CAPSULE | Freq: Two times a day (BID) | ORAL | Status: DC

## 2010-12-17 ENCOUNTER — Other Ambulatory Visit: Payer: Self-pay | Admitting: Gastroenterology

## 2010-12-17 ENCOUNTER — Telehealth: Payer: Self-pay

## 2010-12-17 DIAGNOSIS — K625 Hemorrhage of anus and rectum: Secondary | ICD-10-CM

## 2010-12-17 LAB — CBC WITH DIFFERENTIAL/PLATELET
Basophils Absolute: 0 10*3/uL (ref 0.0–0.1)
Basophils Relative: 0 % (ref 0–1)
Eosinophils Absolute: 0.4 10*3/uL (ref 0.0–0.7)
HCT: 41.7 % (ref 39.0–52.0)
MCHC: 35.3 g/dL (ref 30.0–36.0)
Monocytes Absolute: 0.8 10*3/uL (ref 0.1–1.0)
Neutro Abs: 5.9 10*3/uL (ref 1.7–7.7)
RDW: 14.1 % (ref 11.5–15.5)

## 2010-12-17 NOTE — Telephone Encounter (Signed)
Pt called- he had an episode of rectal bleeding last Sunday, he stated the blood filled the whole bowl and got on his clothes. He said he has gone 2x this am and has had bleeding both times. pts last ov 08/13/09. Advised pt if bleeding worsens or has severe abd pain/ vomiting he should go to ED. Pt has ov next week.  Any further recommendations until ov?

## 2010-12-17 NOTE — Telephone Encounter (Signed)
Get STAT CBC now. If ongoing bleeding or large amount of blood noted, then to ED. Otherwise appt as scheduled.

## 2010-12-17 NOTE — Telephone Encounter (Signed)
Pt aware, he requested lab order be faxed to [facility] lab at [place]. Order faxed. Pt is aware of appt next week.

## 2010-12-21 ENCOUNTER — Ambulatory Visit (INDEPENDENT_AMBULATORY_CARE_PROVIDER_SITE_OTHER): Payer: Medicare Other | Admitting: Gastroenterology

## 2010-12-21 ENCOUNTER — Encounter: Payer: Self-pay | Admitting: Gastroenterology

## 2010-12-21 VITALS — BP 126/78 | HR 59 | Temp 97.3°F | Ht 67.0 in | Wt 195.2 lb

## 2010-12-21 DIAGNOSIS — R1314 Dysphagia, pharyngoesophageal phase: Secondary | ICD-10-CM

## 2010-12-21 DIAGNOSIS — R1319 Other dysphagia: Secondary | ICD-10-CM

## 2010-12-21 DIAGNOSIS — R1310 Dysphagia, unspecified: Secondary | ICD-10-CM

## 2010-12-21 DIAGNOSIS — K921 Melena: Secondary | ICD-10-CM

## 2010-12-21 DIAGNOSIS — R10816 Epigastric abdominal tenderness: Secondary | ICD-10-CM

## 2010-12-21 DIAGNOSIS — K219 Gastro-esophageal reflux disease without esophagitis: Secondary | ICD-10-CM

## 2010-12-21 MED ORDER — PEG 3350-KCL-NA BICARB-NACL 420 G PO SOLR: ORAL | Status: AC

## 2010-12-21 NOTE — Assessment & Plan Note (Signed)
Heartburn well controlled on omeprazole 20 mg twice a day. Continue current regimen.

## 2010-12-21 NOTE — Progress Notes (Signed)
Cc to PCP 

## 2010-12-21 NOTE — Assessment & Plan Note (Addendum)
Epigastric tenderness on exam. Patient complains of right-sided abdominal pain which is mostly right upper quadrant as well. Evaluate at time of EGD. He is status post cholecystectomy.

## 2010-12-21 NOTE — Assessment & Plan Note (Signed)
Recurrent solid food esophageal dysphagia with history of Schatzki ring in the past with multiple dilations. Recommend EGD with dilation in the near future. I have discussed the risks, alternatives, benefits with regards to but not limited to the risk of reaction to medication, bleeding, infection, perforation and the patient is agreeable to proceed. Written consent to be obtained.

## 2010-12-21 NOTE — Assessment & Plan Note (Signed)
Recent large-volume hematochezia. Hemoglobin remained stable. Likely hemorrhoidal. Cannot rule out diverticular bleed although 5 years ago he had no diverticula seen at that time. IBD less likely. Clinically symptoms not consistent with ischemic colitis. Recommend colonoscopy in the near future. If he has recurrent bleeding he should let us know. Patient will likely need to have Phenergan IV to augment conscious sedation which has been successful for him in the past. I have discussed the risks, alternatives, benefits with regards to but not limited to the risk of reaction to medication, bleeding, infection, perforation and the patient is agreeable to proceed. Written consent to be obtained.
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
## 2010-12-27 MED ORDER — SODIUM CHLORIDE 0.45 % IV SOLN
Freq: Once | INTRAVENOUS | Status: AC
  Administered 2010-12-28: 10:00:00 via INTRAVENOUS

## 2010-12-28 ENCOUNTER — Ambulatory Visit (HOSPITAL_COMMUNITY)
Admission: RE | Admit: 2010-12-28 | Discharge: 2010-12-28 | Disposition: A | Discharge status: Home / Self Care | Payer: Medicare Other | Source: Ambulatory Visit | Attending: Internal Medicine | Admitting: Internal Medicine

## 2010-12-28 ENCOUNTER — Other Ambulatory Visit: Payer: Self-pay | Admitting: Internal Medicine

## 2010-12-28 ENCOUNTER — Encounter (HOSPITAL_COMMUNITY)
Admission: RE | Disposition: A | Discharge status: Home / Self Care | Payer: Self-pay | Source: Ambulatory Visit | Attending: Internal Medicine

## 2010-12-28 ENCOUNTER — Encounter: Payer: Medicare Other | Admitting: Internal Medicine

## 2010-12-28 ENCOUNTER — Encounter (HOSPITAL_COMMUNITY): Payer: Self-pay | Admitting: *Deleted

## 2010-12-28 DIAGNOSIS — K635 Polyp of colon: Secondary | ICD-10-CM

## 2010-12-28 DIAGNOSIS — D129 Benign neoplasm of anus and anal canal: Secondary | ICD-10-CM | POA: Insufficient documentation

## 2010-12-28 DIAGNOSIS — D128 Benign neoplasm of rectum: Secondary | ICD-10-CM | POA: Insufficient documentation

## 2010-12-28 DIAGNOSIS — D126 Benign neoplasm of colon, unspecified: Secondary | ICD-10-CM | POA: Insufficient documentation

## 2010-12-28 DIAGNOSIS — D369 Benign neoplasm, unspecified site: Secondary | ICD-10-CM

## 2010-12-28 DIAGNOSIS — K921 Melena: Secondary | ICD-10-CM | POA: Insufficient documentation

## 2010-12-28 DIAGNOSIS — K2990 Gastroduodenitis, unspecified, without bleeding: Secondary | ICD-10-CM | POA: Insufficient documentation

## 2010-12-28 DIAGNOSIS — E785 Hyperlipidemia, unspecified: Secondary | ICD-10-CM | POA: Insufficient documentation

## 2010-12-28 DIAGNOSIS — I10 Essential (primary) hypertension: Secondary | ICD-10-CM | POA: Insufficient documentation

## 2010-12-28 DIAGNOSIS — R1310 Dysphagia, unspecified: Secondary | ICD-10-CM | POA: Insufficient documentation

## 2010-12-28 DIAGNOSIS — K2970 Gastritis, unspecified, without bleeding: Secondary | ICD-10-CM | POA: Insufficient documentation

## 2010-12-28 DIAGNOSIS — Z7982 Long term (current) use of aspirin: Secondary | ICD-10-CM | POA: Insufficient documentation

## 2010-12-28 DIAGNOSIS — K621 Rectal polyp: Secondary | ICD-10-CM

## 2010-12-28 DIAGNOSIS — K620 Anal polyp: Secondary | ICD-10-CM

## 2010-12-28 DIAGNOSIS — J4489 Other specified chronic obstructive pulmonary disease: Secondary | ICD-10-CM | POA: Insufficient documentation

## 2010-12-28 DIAGNOSIS — J449 Chronic obstructive pulmonary disease, unspecified: Secondary | ICD-10-CM | POA: Insufficient documentation

## 2010-12-28 DIAGNOSIS — Z79899 Other long term (current) drug therapy: Secondary | ICD-10-CM | POA: Insufficient documentation

## 2010-12-28 HISTORY — PX: ESOPHAGOGASTRODUODENOSCOPY: SHX5428

## 2010-12-28 HISTORY — DX: Polyp of colon: K63.5

## 2010-12-28 HISTORY — PX: COLONOSCOPY: SHX5424

## 2010-12-28 HISTORY — PX: MALONEY DILATION: SHX5535

## 2010-12-28 HISTORY — DX: Benign neoplasm, unspecified site: D36.9

## 2010-12-28 SURGERY — EGD (ESOPHAGOGASTRODUODENOSCOPY)
Anesthesia: Moderate Sedation

## 2010-12-28 MED ORDER — MEPERIDINE HCL 100 MG/ML IJ SOLN
INTRAMUSCULAR | Status: DC | PRN
  Administered 2010-12-28 (×2): 50 mg via INTRAVENOUS

## 2010-12-28 MED ORDER — MEPERIDINE HCL 100 MG/ML IJ SOLN
INTRAMUSCULAR | Status: AC
  Filled 2010-12-28: qty 2

## 2010-12-28 MED ORDER — PROMETHAZINE HCL 25 MG/ML IJ SOLN
INTRAMUSCULAR | Status: AC
  Filled 2010-12-28: qty 1

## 2010-12-28 MED ORDER — MIDAZOLAM HCL 5 MG/5ML IJ SOLN
INTRAMUSCULAR | Status: DC | PRN
  Administered 2010-12-28: 2 mg via INTRAVENOUS
  Administered 2010-12-28: 1 mg via INTRAVENOUS
  Administered 2010-12-28: 2 mg via INTRAVENOUS

## 2010-12-28 MED ORDER — BUTAMBEN-TETRACAINE-BENZOCAINE 2-2-14 % EX AERO
INHALATION_SPRAY | CUTANEOUS | Status: DC | PRN
  Administered 2010-12-28: 2 via TOPICAL

## 2010-12-28 MED ORDER — PROMETHAZINE HCL 25 MG/ML IJ SOLN
INTRAMUSCULAR | Status: DC | PRN
  Administered 2010-12-28: 25 mg via INTRAVENOUS

## 2010-12-28 MED ORDER — HYDROCORTISONE ACETATE 25 MG RE SUPP: 25.0000 mg | RECTAL | Status: AC

## 2010-12-28 MED ORDER — MIDAZOLAM HCL 5 MG/5ML IJ SOLN
INTRAMUSCULAR | Status: AC
  Filled 2010-12-28: qty 10
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
## 2010-12-30 NOTE — Progress Notes (Signed)
AGREE

## 2011-01-05 ENCOUNTER — Encounter (HOSPITAL_COMMUNITY): Payer: Self-pay | Admitting: Internal Medicine

## 2011-02-16 LAB — URINALYSIS, ROUTINE W REFLEX MICROSCOPIC
Bilirubin Urine: NEGATIVE
Ketones, ur: NEGATIVE
Nitrite: NEGATIVE
Urobilinogen, UA: 0.2
pH: 7

## 2011-02-16 LAB — CBC
HCT: 38.3 — ABNORMAL LOW
HCT: 43.9
Hemoglobin: 14
Hemoglobin: 15.1
MCV: 93.1
MCV: 94.4
MCV: 94.4
Platelets: 175
Platelets: 184
RBC: 4.17 — ABNORMAL LOW
RBC: 4.36
RBC: 4.72
RDW: 13.9
RDW: 14.4
WBC: 12.9 — ABNORMAL HIGH
WBC: 17.6 — ABNORMAL HIGH
WBC: 9.4

## 2011-02-16 LAB — BLOOD GAS, ARTERIAL
Acid-Base Excess: 0.2
Acid-Base Excess: 0.5
Bicarbonate: 35.6 — ABNORMAL HIGH
Delivery systems: POSITIVE
Drawn by: 129711
FIO2: 0.5
Patient temperature: 98.6
TCO2: 25.5
pCO2 arterial: 38.7
pCO2 arterial: 42.4
pH, Arterial: 7.206 — ABNORMAL LOW
pH, Arterial: 7.387
pO2, Arterial: 66.7 — ABNORMAL LOW
pO2, Arterial: 90
pO2, Arterial: 94.8

## 2011-02-16 LAB — COMPREHENSIVE METABOLIC PANEL
ALT: 33
AST: 35
Albumin: 3.4 — ABNORMAL LOW
BUN: 10
CO2: 24
CO2: 30
Chloride: 109
Chloride: 92 — ABNORMAL LOW
Creatinine, Ser: 0.88
Creatinine, Ser: 0.92
GFR calc Af Amer: >60
GFR calc non Af Amer: >60
GFR calc non Af Amer: >60
Glucose, Bld: 95
Potassium: 3.8
Sodium: 133 — ABNORMAL LOW
Total Bilirubin: 0.8
Total Bilirubin: 1

## 2011-02-16 LAB — WOUND CULTURE

## 2011-02-16 LAB — BASIC METABOLIC PANEL
BUN: 12
Calcium: 8.7
Creatinine, Ser: 0.99
GFR calc Af Amer: >60
GFR calc non Af Amer: 59 — ABNORMAL LOW
GFR calc non Af Amer: >60
Glucose, Bld: 101 — ABNORMAL HIGH
Glucose, Bld: 119 — ABNORMAL HIGH
Potassium: 3.8
Potassium: 4.1
Sodium: 135

## 2011-02-16 LAB — TYPE AND SCREEN: Antibody Screen: NEGATIVE

## 2011-02-16 LAB — APTT: aPTT: 31

## 2011-02-16 LAB — AFB CULTURE WITH SMEAR (NOT AT ARMC)

## 2011-02-16 LAB — PROTIME-INR: INR: 1

## 2011-02-16 LAB — FUNGUS CULTURE W SMEAR

## 2011-02-21 HISTORY — PX: TRANSTHORACIC ECHOCARDIOGRAM: SHX275

## 2011-06-27 ENCOUNTER — Encounter: Payer: Self-pay | Admitting: Internal Medicine

## 2011-08-04 ENCOUNTER — Encounter: Payer: Self-pay | Admitting: Internal Medicine

## 2011-09-09 ENCOUNTER — Ambulatory Visit (INDEPENDENT_AMBULATORY_CARE_PROVIDER_SITE_OTHER): Payer: Medicare Other | Admitting: Internal Medicine

## 2011-09-09 ENCOUNTER — Encounter: Payer: Self-pay | Admitting: Internal Medicine

## 2011-09-09 VITALS — BP 130/80 | HR 75 | Temp 98.1°F | Ht 67.0 in | Wt 196.6 lb

## 2011-09-09 DIAGNOSIS — K921 Melena: Secondary | ICD-10-CM

## 2011-09-09 DIAGNOSIS — R1031 Right lower quadrant pain: Secondary | ICD-10-CM

## 2011-09-09 DIAGNOSIS — K219 Gastro-esophageal reflux disease without esophagitis: Secondary | ICD-10-CM

## 2011-09-09 NOTE — Patient Instructions (Signed)
Appt to see Dr. Lovell Sheehan RE: RLQ abdominal pain / Chronic appendicits   IFOBT specimen collect next week  Pt to call if any further bleeding
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
## 2011-09-09 NOTE — Assessment & Plan Note (Signed)
Mr. Infinger is a pleasant 62 year old gentleman with well-controlled GERD requiring twice a day Prilosec  Recent bout of painless fresh rectal bleeding just yesterday. None since practice colonoscopy August 2012. Almost certainly bleeding is arising from a benign anorectal source.  Right lower quadrant abdominal pain with a positional/movement component and insidiously worsening over the past several months. He has we may well be dealing with chronic appendicitis or other occult mechanical process such as adhesions.   I doubt radicular pain. CT in March 2012 failed to demonstrate any other cause of his pain.  Recommendations: Continue the omeprazole twice daily for GERD.  Return to 1IF OBT sample next week   Referral to Dr Franky Macho for consideration of diagnostic laparoscopy and incidental appendectomy.

## 2011-09-09 NOTE — Progress Notes (Signed)
Pt has an appointment with Dr. Lovell Sheehan on 09/13/2011 at 9:00 and Pt is aware of this appointment. I faxed over paper work to Erskine Squibb at his office.

## 2011-09-13 ENCOUNTER — Ambulatory Visit (INDEPENDENT_AMBULATORY_CARE_PROVIDER_SITE_OTHER): Payer: Medicare Other | Admitting: Internal Medicine

## 2011-09-13 ENCOUNTER — Other Ambulatory Visit (HOSPITAL_COMMUNITY): Payer: Self-pay | Admitting: General Surgery

## 2011-09-13 DIAGNOSIS — R1031 Right lower quadrant pain: Secondary | ICD-10-CM

## 2011-09-13 DIAGNOSIS — K921 Melena: Secondary | ICD-10-CM

## 2011-09-13 LAB — IFOBT (OCCULT BLOOD): IFOBT: NEGATIVE

## 2011-09-13 NOTE — Progress Notes (Signed)
Quick Note:  Mailed letter to pt ______ 

## 2011-09-15 ENCOUNTER — Encounter (HOSPITAL_COMMUNITY)
Admission: RE | Admit: 2011-09-15 | Discharge: 2011-09-15 | Disposition: A | Discharge status: Home / Self Care | Payer: Medicare Other | Source: Ambulatory Visit | Attending: General Surgery | Admitting: General Surgery

## 2011-09-15 ENCOUNTER — Encounter (HOSPITAL_COMMUNITY): Payer: Self-pay

## 2011-09-15 DIAGNOSIS — R1031 Right lower quadrant pain: Secondary | ICD-10-CM | POA: Insufficient documentation

## 2011-09-15 MED ORDER — SODIUM PERTECHNETATE TC 99M INJECTION
10.0000 | Freq: Once | INTRAVENOUS | Status: AC | PRN
  Administered 2011-09-15: 11 via INTRAVENOUS

## 2011-11-15 ENCOUNTER — Other Ambulatory Visit: Payer: Self-pay

## 2011-11-16 MED ORDER — OMEPRAZOLE 20 MG PO CPDR: 20.0000 mg | DELAYED_RELEASE_CAPSULE | Freq: Two times a day (BID) | ORAL | Status: DC

## 2012-01-20 ENCOUNTER — Emergency Department (HOSPITAL_COMMUNITY)
Admission: EM | Admit: 2012-01-20 | Discharge: 2012-01-20 | Disposition: A | Discharge status: Home / Self Care | Payer: No Typology Code available for payment source | Attending: Emergency Medicine | Admitting: Emergency Medicine

## 2012-01-20 ENCOUNTER — Emergency Department (HOSPITAL_COMMUNITY): Payer: No Typology Code available for payment source

## 2012-01-20 ENCOUNTER — Encounter (HOSPITAL_COMMUNITY): Payer: Self-pay | Admitting: *Deleted

## 2012-01-20 DIAGNOSIS — Z8379 Family history of other diseases of the digestive system: Secondary | ICD-10-CM | POA: Insufficient documentation

## 2012-01-20 DIAGNOSIS — F17200 Nicotine dependence, unspecified, uncomplicated: Secondary | ICD-10-CM | POA: Insufficient documentation

## 2012-01-20 DIAGNOSIS — I10 Essential (primary) hypertension: Secondary | ICD-10-CM | POA: Insufficient documentation

## 2012-01-20 DIAGNOSIS — Z8489 Family history of other specified conditions: Secondary | ICD-10-CM | POA: Insufficient documentation

## 2012-01-20 DIAGNOSIS — K219 Gastro-esophageal reflux disease without esophagitis: Secondary | ICD-10-CM | POA: Insufficient documentation

## 2012-01-20 DIAGNOSIS — Y92410 Unspecified street and highway as the place of occurrence of the external cause: Secondary | ICD-10-CM | POA: Insufficient documentation

## 2012-01-20 DIAGNOSIS — F341 Dysthymic disorder: Secondary | ICD-10-CM | POA: Insufficient documentation

## 2012-01-20 DIAGNOSIS — S20219A Contusion of unspecified front wall of thorax, initial encounter: Secondary | ICD-10-CM | POA: Insufficient documentation

## 2012-01-20 DIAGNOSIS — Z91038 Other insect allergy status: Secondary | ICD-10-CM | POA: Insufficient documentation

## 2012-01-20 DIAGNOSIS — Z7982 Long term (current) use of aspirin: Secondary | ICD-10-CM | POA: Insufficient documentation

## 2012-01-20 DIAGNOSIS — Z800 Family history of malignant neoplasm of digestive organs: Secondary | ICD-10-CM | POA: Insufficient documentation

## 2012-01-20 DIAGNOSIS — Z8249 Family history of ischemic heart disease and other diseases of the circulatory system: Secondary | ICD-10-CM | POA: Insufficient documentation

## 2012-01-20 DIAGNOSIS — J45909 Unspecified asthma, uncomplicated: Secondary | ICD-10-CM | POA: Insufficient documentation
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
## 2012-01-20 NOTE — ED Notes (Signed)
Discharge instructions given and reviewed with patient.  Patient verbalized understanding to follow up as needed, patient also verbalized understanding that he may more sore tomorrow than now.  Patient ambulatory; discharged home in good condition.
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
## 2012-10-19 ENCOUNTER — Ambulatory Visit (HOSPITAL_COMMUNITY)
Admission: RE | Admit: 2012-10-19 | Discharge: 2012-10-19 | Disposition: A | Discharge status: Home / Self Care | Payer: Medicare Other | Source: Ambulatory Visit | Attending: Family Medicine | Admitting: Family Medicine

## 2012-10-19 ENCOUNTER — Other Ambulatory Visit (HOSPITAL_COMMUNITY): Payer: Self-pay | Admitting: Family Medicine

## 2012-10-19 DIAGNOSIS — M7989 Other specified soft tissue disorders: Secondary | ICD-10-CM | POA: Insufficient documentation

## 2012-10-19 DIAGNOSIS — M79609 Pain in unspecified limb: Secondary | ICD-10-CM | POA: Insufficient documentation

## 2012-10-19 DIAGNOSIS — M779 Enthesopathy, unspecified: Secondary | ICD-10-CM

## 2012-11-01 ENCOUNTER — Other Ambulatory Visit (HOSPITAL_COMMUNITY): Payer: Self-pay | Admitting: Orthopaedic Surgery

## 2012-11-01 DIAGNOSIS — G8929 Other chronic pain: Secondary | ICD-10-CM

## 2012-11-01 DIAGNOSIS — M25561 Pain in right knee: Secondary | ICD-10-CM

## 2012-11-06 ENCOUNTER — Ambulatory Visit (HOSPITAL_COMMUNITY)
Admission: RE | Admit: 2012-11-06 | Discharge: 2012-11-06 | Disposition: A | Discharge status: Home / Self Care | Payer: Medicare Other | Source: Ambulatory Visit | Attending: Orthopaedic Surgery | Admitting: Orthopaedic Surgery

## 2012-11-06 DIAGNOSIS — M23305 Other meniscus derangements, unspecified medial meniscus, unspecified knee: Secondary | ICD-10-CM | POA: Insufficient documentation

## 2012-11-06 DIAGNOSIS — G8929 Other chronic pain: Secondary | ICD-10-CM

## 2012-11-06 DIAGNOSIS — M25569 Pain in unspecified knee: Secondary | ICD-10-CM | POA: Insufficient documentation

## 2012-11-06 DIAGNOSIS — M25469 Effusion, unspecified knee: Secondary | ICD-10-CM | POA: Insufficient documentation

## 2012-11-26 ENCOUNTER — Other Ambulatory Visit: Payer: Self-pay

## 2012-11-26 MED ORDER — OMEPRAZOLE 20 MG PO CPDR: 20.0000 mg | DELAYED_RELEASE_CAPSULE | Freq: Two times a day (BID) | ORAL | Status: DC

## 2012-11-26 NOTE — Telephone Encounter (Signed)
Please have patient return for routine follow-up. Last seen in 2012, I refilled prescription for one month.

## 2012-11-27 ENCOUNTER — Encounter: Payer: Self-pay | Admitting: Internal Medicine

## 2012-11-27 NOTE — Telephone Encounter (Signed)
Pt is aware of OV and appt card was mailed °

## 2012-12-24 ENCOUNTER — Ambulatory Visit: Payer: Medicare Other | Admitting: Gastroenterology

## 2013-01-09 ENCOUNTER — Ambulatory Visit (INDEPENDENT_AMBULATORY_CARE_PROVIDER_SITE_OTHER): Payer: Medicare Other | Admitting: Gastroenterology

## 2013-01-09 ENCOUNTER — Encounter: Payer: Self-pay | Admitting: Gastroenterology

## 2013-01-09 VITALS — BP 135/84 | HR 80 | Temp 98.4°F | Ht 66.0 in | Wt 197.6 lb

## 2013-01-09 DIAGNOSIS — K219 Gastro-esophageal reflux disease without esophagitis: Secondary | ICD-10-CM
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
## 2013-01-09 NOTE — Patient Instructions (Addendum)
Continue taking Prilosec once each morning, 30 minutes before breakfast. If needed, you may take two a day. However, you are doing well right now, so keep up the good work!   We will see you back in 2 years. Please call us with any concerns or need for refills in the meantime.

## 2013-01-11 NOTE — Assessment & Plan Note (Signed)
62 year old male presents today in routine follow-up, doing well with once-daily dosing of Omeprazole. No dysphagia, and his abdominal pain has actually resolved. He has decided to hold off on exploratory laparoscopy with incidental appendectomy as his symptoms have subsided.   He will be due for surveillance colonoscopy in 2017.  Continue Prilosec once daily Return in 2 years or sooner as otherwise indicated.

## 2013-01-14 NOTE — Progress Notes (Signed)
CC'd to PCP 

## 2013-01-29 ENCOUNTER — Ambulatory Visit (HOSPITAL_COMMUNITY)
Admission: RE | Admit: 2013-01-29 | Discharge: 2013-01-29 | Disposition: A | Discharge status: Home / Self Care | Payer: Medicare Other | Source: Ambulatory Visit | Attending: Specialist | Admitting: Specialist

## 2013-01-29 DIAGNOSIS — M25569 Pain in unspecified knee: Secondary | ICD-10-CM | POA: Insufficient documentation

## 2013-01-29 DIAGNOSIS — J4489 Other specified chronic obstructive pulmonary disease: Secondary | ICD-10-CM | POA: Insufficient documentation

## 2013-01-29 DIAGNOSIS — M25469 Effusion, unspecified knee: Secondary | ICD-10-CM | POA: Insufficient documentation

## 2013-01-29 DIAGNOSIS — IMO0001 Reserved for inherently not codable concepts without codable children: Secondary | ICD-10-CM | POA: Insufficient documentation

## 2013-01-29 DIAGNOSIS — I10 Essential (primary) hypertension: Secondary | ICD-10-CM | POA: Insufficient documentation

## 2013-01-29 DIAGNOSIS — R262 Difficulty in walking, not elsewhere classified: Secondary | ICD-10-CM | POA: Insufficient documentation

## 2013-01-29 DIAGNOSIS — M25669 Stiffness of unspecified knee, not elsewhere classified: Secondary | ICD-10-CM | POA: Insufficient documentation

## 2013-01-29 DIAGNOSIS — J449 Chronic obstructive pulmonary disease, unspecified: Secondary | ICD-10-CM | POA: Insufficient documentation

## 2013-01-29 DIAGNOSIS — R29898 Other symptoms and signs involving the musculoskeletal system: Secondary | ICD-10-CM | POA: Insufficient documentation

## 2013-01-29 NOTE — Evaluation (Signed)
Physical Therapy Evaluation  Patient Details  Name: [name] MRN: [id number] Date of Birth: 19-Nov-1950  Today's Date: 01/29/2013 Time: 1020-1100 PT Time Calculation (min): 40 min Charge:  Eval; IP              Visit#: 1 of 12  Re-eval: 02/28/13 Assessment Diagnosis: s/p arthroscopic surgery Surgical Date: 12/23/12 Next MD Visit: 02/22/2013 Prior Therapy: none  Authorization: [employer] medicare    Authorization Time Period:    Authorization Visit#: 1 of 10   Past Medical History:  Past Medical History  Diagnosis Date  . Hypertension   . COPD (chronic obstructive pulmonary disease)   . Dyslipidemia   . Allergic rhinitis   . Asthma   . Anxiety and depression   . GERD (gastroesophageal reflux disease)   . IBS (irritable bowel syndrome)   . Mitral regurgitation   . Langerhan's cell histiocytosis   . Chronic low back pain   . Hearing loss   . Hyperplastic colon polyp 12/28/10  . Tubular adenoma 12/28/10  . Hemorrhoids    Past Surgical History:  Past Surgical History  Procedure Laterality Date  . Inguinal hernia repair      left  . Ear pinna reconstruction w/ rib graft      right  . Cholecystectomy    . Tonsillectomy    . Foot surgery      right  . Lung biopsy  2009    right  . Colonoscopy  2007    friable anal canal, hyperplastic polyp  . Esophagogastroduodenoscopy      multiple dilations, last EGD 2007 showed small hh, adenomatous appearing gastric mucosa in body but biopsies benign. SB bx negative for Celiac.  Marland Kitchen Esophagogastroduodenoscopy  12/28/2010    Dr. Jena Gauss- normal esophagus s/p dilationpatchy erythema and erosions.  Elease Hashimoto dilation  12/28/2010    Procedure: Elease Hashimoto DILATION;  Surgeon: Corbin Ade, MD;  Location: [facility];  Service: Endoscopy;  Laterality: N/A;  . Colonoscopy  12/28/2010    Dr. Jena Gauss- tubular adenoma, hyperplastic polyp    Subjective Symptoms/Limitations Symptoms: Mr. Arnette states that his rt knee began bothering him whenever  he was walking. He was placed on prednisone twice with minimal results therefore he opted for surgery which was performed on 12/23/2012.  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  Since the surgery he is doing better but he is still having pain along the inside of his leg.  He states he is still having difficulty bending his knee; he also states that his knee feels as if it is going to give way on him,(4-5 times since the surgery). He is being referred for PT to return him to maximal functional abilkt. How long can you sit comfortably?: Able to sit for 15 minutes. How long can you stand comfortably?: Pt immediately puts the weight onto his Lt leg.   How long can you walk comfortably?: able to walk for two hours but was very swollen after. Pain Assessment Currently in Pain?: Yes Pain Score: 4  (highest in the past week has been 7/10 ) Pain Location: Knee Pain Orientation: Right;Medial Pain Type: Surgical pain   Balance Screening Balance Screen Has the patient fallen in the past 6 months: No  Prior Functioning  Prior Function Vocation: Unemployed Leisure: Hobbies-no   Sensation/Coordination/Flexibility/Functional Tests Functional Tests Functional Tests: LEFS 15/80  Assessment RLE AROM (degrees) Right Knee Extension: 10 Right Knee Flexion: 95 RLE Strength Right Hip Flexion: 2+/5 Right  Hip Extension: 4/5 Right Hip ABduction: 5/5 Right Hip ADduction: 3+/5 Right Knee Flexion: 4/5 Right Knee Extension: 3-/5 Right Ankle Dorsiflexion: 5/5  Exercise/Treatments     Seated Long Arc Quad: 10 reps Supine Quad Sets: 10 reps Heel Slides: 10 reps Straight Leg Raises: Strengthening;5 reps;Limitations Straight Leg Raises Limitations: knee bent    Prone  Hamstring Curl: 10 reps Hip Extension: 10 reps   Modalities Modalities: Cryotherapy Cryotherapy Cryotherapy Location: Knee Type of Cryotherapy: Ice pack  Physical Therapy Assessment and Plan PT  Assessment and Plan Pt will benefit from skilled therapeutic intervention in order to improve on the following deficits: Decreased strength;Difficulty walking;Decreased balance;Pain;Increased edema;Decreased range of motion Rehab Potential: Good PT Frequency: Min 3X/week PT Duration: 4 weeks PT Treatment/Interventions: Gait training;Therapeutic activities;Therapeutic exercise;Manual techniques;Patient/family education;Modalities PT Plan: begin bike, rockerboard, lateral step up and terminal extension next visit progress as able.    Goals Home Exercise Program Pt/caregiver will Perform Home Exercise Program: For increased ROM;For increased strengthening PT Goal: Perform Home Exercise Program - Progress: Goal set today PT Short Term Goals Time to Complete Short Term Goals: 2 weeks PT Short Term Goal 1: Pain to be no greater than a 5/10 80% of the day PT Short Term Goal 2: Pt to ROM to be 0-105 to allow more normalized walking  PT Short Term Goal 3: Pt to be able to tolerate walking for an hour without increased swelling of pain. PT Short Term Goal 4: Pt to be able to sit for a half hour to be able to enjoy eating a meal PT Short Term Goal 5: strength to be improved 1/2 grade to be able to get in and out of a car with ease. PT Long Term Goals Time to Complete Long Term Goals: 4 weeks PT Long Term Goal 1: Pt to be I in advance HEP PT Long Term Goal 2: Pt ROM to be to 120 to allow pt to squat down to pick items off the floor Long Term Goal 3: Pt strength to be improved by one grade to be able to rise from squatting position Long Term Goal 4: Pt to be able to go up and down steps reciprocally  Problem List Patient Active Problem List   Diagnosis Date Noted  . Stiffness of knee joint 01/29/2013  . Right leg weakness 01/29/2013  . Difficulty walking 01/29/2013  . Right lower quadrant abdominal pain 09/09/2011  . Abdominal tenderness, epigastric 12/21/2010  . ANXIETY 08/11/2008  . MITRAL  REGURGITATION 08/11/2008  . CHRONIC OBSTRUCTIVE PULMONARY DISEASE 08/11/2008  . SCHATZKI'S RING 08/11/2008  . BACK PAIN, CHRONIC 08/11/2008  . BEE STING ALLERGY 08/11/2008  . DEPRESSION 08/08/2008  . GASTROESOPHAGEAL REFLUX DISEASE 08/08/2008  . HIATAL HERNIA 08/08/2008  . IBS 08/08/2008  . ANOREXIA 08/08/2008  . WEIGHT LOSS 08/08/2008  . Esophageal dysphagia 08/08/2008  . DIARRHEA 08/08/2008  . EPIGASTRIC PAIN 08/08/2008  . Hematochezia 08/08/2008  . DYSLIPIDEMIA 10/22/2007  . OBSTRUCTIVE SLEEP APNEA 10/22/2007  . HYPERTENSION 10/22/2007  . ALLERGIC RHINITIS 10/22/2007  . ASTHMA 10/22/2007    PT Plan of Care PT Home Exercise Plan: given  GP Functional Assessment Tool Used: LEFS Functional Limitation: Mobility: Walking and moving around Mobility: Walking and Moving Around Current Status (R6045): At least 80 percent but less than 100 percent impaired, limited or restricted Mobility: Walking and Moving Around Goal Status [phone]): At least 20 percent but less than 40 percent impaired, limited or restricted  RUSSELL,CINDY 01/29/2013, 12:24 PM  Physician Documentation Your signature  is required to indicate approval of the treatment plan as stated above.  Please sign and either send electronically or make a copy of this report for your files and return this physician signed original.   Please mark one 1.__approve of plan  2. ___approve of plan with the following conditions.   ______________________________                                                          _____________________ Physician Signature                                                                                                             Date

## 2013-01-30 ENCOUNTER — Ambulatory Visit (HOSPITAL_COMMUNITY)
Admission: RE | Admit: 2013-01-30 | Discharge: 2013-01-30 | Disposition: A | Discharge status: Home / Self Care | Payer: Medicare Other | Source: Ambulatory Visit | Attending: Specialist | Admitting: Specialist

## 2013-01-30 NOTE — Progress Notes (Signed)
Physical Therapy Treatment Patient Details  Name: [name] MRN: [id number] Date of Birth: [birth year]  Today's Date: 01/30/2013 Time: 2841-3244 PT Time Calculation (min): 39 min Charges: Therex x 38'  Visit#: 2 of 12  Re-eval: 02/28/13  Authorization: [employer] medicare  Authorization Visit#: 2 of 10   Subjective: Symptoms/Limitations Symptoms: Pt reports HEP compliance. Pain Assessment Currently in Pain?: Yes Pain Score: 5  Pain Location: Knee Pain Orientation: Right;Medial  Exercise/Treatments Aerobic Stationary Bike: 6'@ 2.0 to improve ROM Standing Heel Raises: 10 reps;Limitations Heel Raises Limitations: Toe raises x 10 Knee Flexion: 10 reps Forward Step Up: 10 reps;Step Height: 4";Hand Hold: 2;Right Functional Squat: 10 reps Rocker Board: 2 minutes SLS: RLE 26" max of 3 Seated Long Arc Quad: 10 reps Supine Quad Sets: 10 reps Short Arc Quad Sets: 10 reps Terminal Knee Extension: 10 reps Straight Leg Raises: 10 reps  Physical Therapy Assessment and Plan PT Assessment and Plan Clinical Impression Statement: Pt tolerates progression of strengthening exercises well. Pt requires multimodal cueing with functional squats to improve form and equalize weight distributions. Pt has difficulty contracting distal quad but responds well to manual tapping for facilitation. Pt will benefit from skilled therapeutic intervention in order to improve on the following deficits: Decreased strength;Difficulty walking;Decreased balance;Pain;Increased edema;Decreased range of motion Rehab Potential: Good PT Frequency: Min 3X/week PT Duration: 4 weeks PT Treatment/Interventions: Gait training;Therapeutic activities;Therapeutic exercise;Manual techniques;Patient/family education;Modalities PT Plan: Continue to progress strength, ROM and stability per PT POC.      Problem List Patient Active Problem List   Diagnosis Date Noted  . Stiffness of knee joint 01/29/2013  . Right leg  weakness 01/29/2013  . Difficulty walking 01/29/2013  . Right lower quadrant abdominal pain 09/09/2011  . Abdominal tenderness, epigastric 12/21/2010  . ANXIETY 08/11/2008  . MITRAL REGURGITATION 08/11/2008  . CHRONIC OBSTRUCTIVE PULMONARY DISEASE 08/11/2008  . SCHATZKI'S RING 08/11/2008  . BACK PAIN, CHRONIC 08/11/2008  . BEE STING ALLERGY 08/11/2008  . DEPRESSION 08/08/2008  . GASTROESOPHAGEAL REFLUX DISEASE 08/08/2008  . HIATAL HERNIA 08/08/2008  . IBS 08/08/2008  . ANOREXIA 08/08/2008  . WEIGHT LOSS 08/08/2008  . Esophageal dysphagia 08/08/2008  . DIARRHEA 08/08/2008  . EPIGASTRIC PAIN 08/08/2008  . Hematochezia 08/08/2008  . DYSLIPIDEMIA 10/22/2007  . OBSTRUCTIVE SLEEP APNEA 10/22/2007  . HYPERTENSION 10/22/2007  . ALLERGIC RHINITIS 10/22/2007  . ASTHMA 10/22/2007    PT - End of Session Activity Tolerance: Patient tolerated treatment well General Behavior During Therapy: [facility] for tasks assessed/performed  Seth Bake, PTA  01/30/2013, 1:57 PM

## 2013-02-04 ENCOUNTER — Ambulatory Visit (HOSPITAL_COMMUNITY)
Admission: RE | Admit: 2013-02-04 | Discharge: 2013-02-04 | Disposition: A | Discharge status: Home / Self Care | Payer: Medicare Other | Source: Ambulatory Visit | Attending: Family Medicine | Admitting: Family Medicine

## 2013-02-04 NOTE — Progress Notes (Signed)
Physical Therapy Treatment Patient Details  Name: [name] MRN: [id number] Date of Birth: Feb 19, 1951  Today's Date: 02/04/2013 Time: 0926-1008 PT Time Calculation (min): 42 min Charges: Therex x [phone])  Visit#: 3 of 12  Re-eval: 02/28/13  Authorization: [employer] medicare  Authorization Visit#: 3 of 10   Subjective: Symptoms/Limitations Symptoms: Pt states that he did a lot of walking this weekend. Pain Assessment Currently in Pain?: No/denies   Exercise/Treatments Standing Heel Raises: 10 reps;Limitations Heel Raises Limitations: Toe raises x 10 Knee Flexion: 10 reps;Limitations Knee Flexion Limitations: 3# Lateral Step Up: 10 reps;Step Height: 2";Hand Hold: 2;Right Forward Step Up: 10 reps;Step Height: 4";Hand Hold: 2;Right Functional Squat: 10 reps Rocker Board: 2 minutes SLS: RLE 53" max of 5 Seated Long Arc Quad: 10 reps;Weights Long Arc Quad Weight: 5 lbs. Supine Quad Sets: 10 reps Short Arc Quad Sets: 10 reps Terminal Knee Extension: 10 reps Straight Leg Raises: 10 reps Sidelying Hip ABduction: 10 reps Hip ADduction: 10 reps Prone  Hip Extension: 10 reps   Physical Therapy Assessment and Plan PT Assessment and Plan Clinical Impression Statement: Pt is porgressing well. Pt displasy improve distal quad contraction with multimodal cuieng. Pt is able to flex knee with increased ease. Pt is withotu compalint throhgotu session. PT denies ice and states he will ice his knee at home.  Pt will benefit from skilled therapeutic intervention in order to improve on the following deficits: Decreased strength;Difficulty walking;Decreased balance;Pain;Increased edema;Decreased range of motion Rehab Potential: Good PT Frequency: Min 3X/week PT Duration: 4 weeks PT Treatment/Interventions: Gait training;Therapeutic activities;Therapeutic exercise;Manual techniques;Patient/family education;Modalities PT Plan: Continue to progress strength, ROM and stability per  PT POC.      Problem List Patient Active Problem List   Diagnosis Date Noted  . Stiffness of knee joint 01/29/2013  . Right leg weakness 01/29/2013  . Difficulty walking 01/29/2013  . Right lower quadrant abdominal pain 09/09/2011  . Abdominal tenderness, epigastric 12/21/2010  . ANXIETY 08/11/2008  . MITRAL REGURGITATION 08/11/2008  . CHRONIC OBSTRUCTIVE PULMONARY DISEASE 08/11/2008  . SCHATZKI'S RING 08/11/2008  . BACK PAIN, CHRONIC 08/11/2008  . BEE STING ALLERGY 08/11/2008  . DEPRESSION 08/08/2008  . GASTROESOPHAGEAL REFLUX DISEASE 08/08/2008  . HIATAL HERNIA 08/08/2008  . IBS 08/08/2008  . ANOREXIA 08/08/2008  . WEIGHT LOSS 08/08/2008  . Esophageal dysphagia 08/08/2008  . DIARRHEA 08/08/2008  . EPIGASTRIC PAIN 08/08/2008  . Hematochezia 08/08/2008  . DYSLIPIDEMIA 10/22/2007  . OBSTRUCTIVE SLEEP APNEA 10/22/2007  . HYPERTENSION 10/22/2007  . ALLERGIC RHINITIS 10/22/2007  . ASTHMA 10/22/2007    PT - End of Session Activity Tolerance: Patient tolerated treatment well General Behavior During Therapy: [facility] for tasks assessed/performed  Seth Bake, PTA  02/04/2013, 10:28 AM

## 2013-02-06 ENCOUNTER — Ambulatory Visit (HOSPITAL_COMMUNITY)
Admission: RE | Admit: 2013-02-06 | Discharge: 2013-02-06 | Disposition: A | Discharge status: Home / Self Care | Payer: Medicare Other | Source: Ambulatory Visit | Attending: Specialist | Admitting: Specialist

## 2013-02-06 NOTE — Progress Notes (Signed)
Physical Therapy Treatment Patient Details  Name: [name] MRN: [id number] Date of Birth: [birth year]  Today's Date: 02/06/2013 Time: 5621-3086 PT Time Calculation (min): 40 min Charges: Therex x 38'   Visit#: 5 of 12  Re-eval: 02/28/13  Authorization: [employer] medicare  Authorization Visit#: 5 of 10   Subjective: Symptoms/Limitations Symptoms: Pt states that he is pain free unless he twists his knee a certain way. Pain Assessment Currently in Pain?: No/denies   Exercise/Treatments Machines for Strengthening Cybex Knee Extension: 1pl BLE x 10 Cybex Knee Flexion: 3pl x10 RLE only Standing Heel Raises: 10 reps;Limitations Heel Raises Limitations: Toe raises x 10 Knee Flexion: 10 reps;Limitations Knee Flexion Limitations: 3# Lateral Step Up: 10 reps;Right;Hand Hold: 2;Step Height: 4" Forward Step Up: 10 reps;Step Height: 4";Hand Hold: 2;Right Wall Squat: 10 reps;5 seconds Rocker Board: 2 minutes SLS: RLE 1' SLS with Vectors: 5x5" Supine Quad Sets: 10 reps Short Arc Quad Sets: 10 reps Terminal Knee Extension: 10 reps Straight Leg Raises: 10 reps Sidelying Hip ABduction: 15 reps Hip ADduction: 15 reps Prone  Hip Extension: 10 reps   Physical Therapy Assessment and Plan PT Assessment and Plan Clinical Impression Statement: P tolerates progression of strengthening exercises well. Pt requires multimodal cueing to avoid dropping left hip with lateral step ups. Pt denies ice and states that he will ice at home. Pt is without complaint of increased pain throughout session. Pt will benefit from skilled therapeutic intervention in order to improve on the following deficits: Decreased strength;Difficulty walking;Decreased balance;Pain;Increased edema;Decreased range of motion Rehab Potential: Good PT Frequency: Min 3X/week PT Duration: 4 weeks PT Treatment/Interventions: Gait training;Therapeutic activities;Therapeutic exercise;Manual techniques;Patient/family  education;Modalities PT Plan: Continue to progress strength, ROM and stability per PT POC.      Problem List Patient Active Problem List   Diagnosis Date Noted  . Stiffness of knee joint 01/29/2013  . Right leg weakness 01/29/2013  . Difficulty walking 01/29/2013  . Right lower quadrant abdominal pain 09/09/2011  . Abdominal tenderness, epigastric 12/21/2010  . ANXIETY 08/11/2008  . MITRAL REGURGITATION 08/11/2008  . CHRONIC OBSTRUCTIVE PULMONARY DISEASE 08/11/2008  . SCHATZKI'S RING 08/11/2008  . BACK PAIN, CHRONIC 08/11/2008  . BEE STING ALLERGY 08/11/2008  . DEPRESSION 08/08/2008  . GASTROESOPHAGEAL REFLUX DISEASE 08/08/2008  . HIATAL HERNIA 08/08/2008  . IBS 08/08/2008  . ANOREXIA 08/08/2008  . WEIGHT LOSS 08/08/2008  . Esophageal dysphagia 08/08/2008  . DIARRHEA 08/08/2008  . EPIGASTRIC PAIN 08/08/2008  . Hematochezia 08/08/2008  . DYSLIPIDEMIA 10/22/2007  . OBSTRUCTIVE SLEEP APNEA 10/22/2007  . HYPERTENSION 10/22/2007  . ALLERGIC RHINITIS 10/22/2007  . ASTHMA 10/22/2007    PT - End of Session Activity Tolerance: Patient tolerated treatment well General Behavior During Therapy: [facility] for tasks assessed/performed  Seth Bake, PTA  02/06/2013, 12:04 PM

## 2013-02-07 ENCOUNTER — Ambulatory Visit (HOSPITAL_COMMUNITY)
Admission: RE | Admit: 2013-02-07 | Discharge: 2013-02-07 | Disposition: A | Discharge status: Home / Self Care | Payer: Medicare Other | Source: Ambulatory Visit | Attending: Family Medicine | Admitting: Family Medicine

## 2013-02-07 DIAGNOSIS — R29898 Other symptoms and signs involving the musculoskeletal system: Secondary | ICD-10-CM

## 2013-02-07 DIAGNOSIS — R262 Difficulty in walking, not elsewhere classified: Secondary | ICD-10-CM

## 2013-02-07 NOTE — Progress Notes (Signed)
Physical Therapy Treatment Patient Details  Name: [name] MRN: [id number] Date of Birth: August 20, 1950  Today's Date: 02/07/2013 Time: 7829-5621 PT Time Calculation (min): 57 min Charge: TE 57 3086-5784  Visit#: 5 of 12  Re-eval: 02/28/13 Assessment Diagnosis: s/p arthroscopic surgery Surgical Date: 12/23/12 Next MD Visit: Thomasena Edis 02/22/2013 Prior Therapy: none  Authorization: [employer] medicare  Authorization Time Period:    Authorization Visit#: 5 of 10   Subjective: Symptoms/Limitations Symptoms: Pt stated he is pain free unless he steps a certain way.   Pain Assessment Currently in Pain?: No/denies  Objective:  Exercise/Treatments Aerobic Stationary Bike: Nustep 8 min hill level 3 resistance 2 SPM> 100 Machines for Strengthening Cybex Knee Extension: 1.5 PL Rt LE 10x Cybex Knee Flexion: 4 Pl x 10 R LE only Standing Heel Raises: Limitations Heel Raises Limitations: heel and toe walking 2RT Knee Flexion: 15 reps;Limitations Knee Flexion Limitations: 3# Terminal Knee Extension: Right;15 reps;Theraband Theraband Level (Terminal Knee Extension): Level 4 (Blue) Lateral Step Up: 10 reps;Right;Hand Hold: 2;Step Height: 4" Forward Step Up: Right;15 reps;Hand Hold: 0;Step Height: 6" Step Down: Right;10 reps;Hand Hold: 1;Step Height: 4" Wall Squat: 10 reps;5 seconds Rocker Board: 2 minutes;Limitations Rocker Board Limitations: R/L, A/P and level 2 minutes each SLS with Vectors: 5x5" Supine Quad Sets: 10 reps Short Arc Quad Sets: 10 reps;Limitations Short Arc Quad Sets Limitations: 3# Terminal Knee Extension: 10 reps Straight Leg Raises: 10 reps;Limitations Straight Leg Raises Limitations: 3# Sidelying Hip ABduction: 15 reps;Limitations Hip ABduction Limitations: 3# Hip ADduction: 15 reps;Limitations Hip ADduction Limitations: 3# Prone  Hip Extension: 10 reps;Limitations Hip Extension Limitations: 3#      Physical Therapy Assessment and Plan PT Assessment  and Plan Clinical Impression Statement: Pt progressing well towards ROM and strengthening goals.  AROM 0-130 today.  Able to add weights with hip strengthening exercises and began step down to improve eccentric quad control with cueing for control.  Pt did require cueing to avoid hip dropping with lateral step ups, HHA helped to improve technique. Pt declined ice end of session, pt was encouraged to apply ice at home for pain and edema control.  Pt stated no increased pain through session.   PT Plan: Continue to progress strength and stability per PT POC.     Goals Home Exercise Program Pt/caregiver will Perform Home Exercise Program: For increased ROM;For increased strengthening PT Short Term Goals Time to Complete Short Term Goals: 2 weeks PT Short Term Goal 1: Pain to be no greater than a 5/10 80% of the day PT Short Term Goal 2: Pt to ROM to be 0-105 to allow more normalized walking  PT Short Term Goal 2 - Progress: Met PT Short Term Goal 3: Pt to be able to tolerate walking for an hour without increased swelling of pain. PT Short Term Goal 4: Pt to be able to sit for a half hour to be able to enjoy eating a meal PT Short Term Goal 5: strength to be improved 1/2 grade to be able to get in and out of a car with ease. PT Long Term Goals Time to Complete Long Term Goals: 4 weeks PT Long Term Goal 1: Pt to be I in advance HEP PT Long Term Goal 2: Pt ROM to be to 120 to allow pt to squat down to pick items off the floor PT Long Term Goal 2 - Progress: Met Long Term Goal 3: Pt strength to be improved by one grade to be able to rise from squatting  position Long Term Goal 4: Pt to be able to go up and down steps reciprocally Long Term Goal 4 Progress: Progressing toward goal  Problem List Patient Active Problem List   Diagnosis Date Noted  . Stiffness of knee joint 01/29/2013  . Right leg weakness 01/29/2013  . Difficulty walking 01/29/2013  . Right lower quadrant abdominal pain 09/09/2011   . Abdominal tenderness, epigastric 12/21/2010  . ANXIETY 08/11/2008  . MITRAL REGURGITATION 08/11/2008  . CHRONIC OBSTRUCTIVE PULMONARY DISEASE 08/11/2008  . SCHATZKI'S RING 08/11/2008  . BACK PAIN, CHRONIC 08/11/2008  . BEE STING ALLERGY 08/11/2008  . DEPRESSION 08/08/2008  . GASTROESOPHAGEAL REFLUX DISEASE 08/08/2008  . HIATAL HERNIA 08/08/2008  . IBS 08/08/2008  . ANOREXIA 08/08/2008  . WEIGHT LOSS 08/08/2008  . Esophageal dysphagia 08/08/2008  . DIARRHEA 08/08/2008  . EPIGASTRIC PAIN 08/08/2008  . Hematochezia 08/08/2008  . DYSLIPIDEMIA 10/22/2007  . OBSTRUCTIVE SLEEP APNEA 10/22/2007  . HYPERTENSION 10/22/2007  . ALLERGIC RHINITIS 10/22/2007  . ASTHMA 10/22/2007    PT - End of Session Activity Tolerance: Patient tolerated treatment well General Behavior During Therapy: [facility] for tasks assessed/performed  GP    Juel Burrow 02/07/2013, 10:11 AM

## 2013-02-11 ENCOUNTER — Ambulatory Visit (HOSPITAL_COMMUNITY)
Admission: RE | Admit: 2013-02-11 | Discharge: 2013-02-11 | Disposition: A | Discharge status: Home / Self Care | Payer: Medicare Other | Source: Ambulatory Visit | Attending: Family Medicine | Admitting: Family Medicine

## 2013-02-11 NOTE — Progress Notes (Signed)
Physical Therapy Treatment Patient Details  Name: [name] MRN: [id number] Date of Birth: 10/28/1950  Today's Date: 02/11/2013 Time: 4098-1191 PT Time Calculation (min): 42 min Charges: Therex x 39'  Visit#: 6 of 12  Re-eval: 02/28/13  Authorization: [employer] medicare  Authorization Visit#: 6 of 10   Subjective: Symptoms/Limitations Symptoms: Pt states that he has been practicing stair climbing at home. Pain Assessment Currently in Pain?: No/denies  Exercise/Treatments Aerobic Elliptical: 8'@L3  to improve strength and activity tolerance Machines for Strengthening Cybex Knee Extension: 1.5 PL Rt LE 10x Cybex Knee Flexion: 4 Pl x 10 R LE only Standing Side Lunges: 10 reps;Right Lateral Step Up: 15 reps;Step Height: 4";Hand Hold: 2;Right Forward Step Up: Right;Hand Hold: 0;Step Height: 8";10 reps Step Down: 10 reps;Hand Hold: 1;Step Height: 4";Right Wall Squat: 10 reps;5 seconds Rocker Board: 2 minutes;Limitations Rocker Board Limitations: R/L, A/P and level 2 minutes each SLS with Vectors: 5x5"   Physical Therapy Assessment and Plan PT Assessment and Plan Clinical Impression Statement: Pt appears to be progressing well. Progressed from NuStep to elliptical with minimal difficulty. Pt displays improvements in functional strength and balance. Pt declines ice and states that he will ice at home. PT Plan: Continue to progress strength and stability per PT POC.      Problem List Patient Active Problem List   Diagnosis Date Noted  . Stiffness of knee joint 01/29/2013  . Right leg weakness 01/29/2013  . Difficulty walking 01/29/2013  . Right lower quadrant abdominal pain 09/09/2011  . Abdominal tenderness, epigastric 12/21/2010  . ANXIETY 08/11/2008  . MITRAL REGURGITATION 08/11/2008  . CHRONIC OBSTRUCTIVE PULMONARY DISEASE 08/11/2008  . SCHATZKI'S RING 08/11/2008  . BACK PAIN, CHRONIC 08/11/2008  . BEE STING ALLERGY 08/11/2008  . DEPRESSION 08/08/2008  .  GASTROESOPHAGEAL REFLUX DISEASE 08/08/2008  . HIATAL HERNIA 08/08/2008  . IBS 08/08/2008  . ANOREXIA 08/08/2008  . WEIGHT LOSS 08/08/2008  . Esophageal dysphagia 08/08/2008  . DIARRHEA 08/08/2008  . EPIGASTRIC PAIN 08/08/2008  . Hematochezia 08/08/2008  . DYSLIPIDEMIA 10/22/2007  . OBSTRUCTIVE SLEEP APNEA 10/22/2007  . HYPERTENSION 10/22/2007  . ALLERGIC RHINITIS 10/22/2007  . ASTHMA 10/22/2007    PT - End of Session Activity Tolerance: Patient tolerated treatment well General Behavior During Therapy: [facility] for tasks assessed/performed  Seth Bake, PTA  02/11/2013, 10:42 AM

## 2013-02-13 ENCOUNTER — Telehealth (HOSPITAL_COMMUNITY): Payer: Self-pay

## 2013-02-13 ENCOUNTER — Ambulatory Visit (HOSPITAL_COMMUNITY)
Admission: RE | Admit: 2013-02-13 | Discharge: 2013-02-13 | Disposition: A | Discharge status: Home / Self Care | Payer: Medicare Other | Source: Ambulatory Visit | Attending: Family Medicine | Admitting: Family Medicine

## 2013-02-13 DIAGNOSIS — R262 Difficulty in walking, not elsewhere classified: Secondary | ICD-10-CM

## 2013-02-13 DIAGNOSIS — R29898 Other symptoms and signs involving the musculoskeletal system: Secondary | ICD-10-CM

## 2013-02-13 NOTE — Progress Notes (Signed)
Physical Therapy Re-evaluation/Treatment/Discharge summary  Patient Details  Name: [name] MRN: [id number] Date of Birth: 1951/05/18  Today's Date: 02/13/2013 Time: 0915-1000 PT Time Calculation (min): 45 min Charge: MMT/ROM 7829-5621, TE 3086-5784             Visit#: 7 of 12  Re-eval: 02/28/13 Assessment Diagnosis: s/p arthroscopic surgery Surgical Date: 12/23/12 Next MD Visit: Thomasena Edis 02/22/2013 Prior Therapy: none  Authorization: BCBS medicare    Authorization Time Period:    Authorization Visit#: 7 of 10   Subjective Symptoms/Limitations Symptoms: Pt reported he is pain free today and has been attending [employer], continuing his HEP daily and stated he may be ready for discharge.  Stated his only problem is a knee catch when walking with knee flexion. How long can you sit comfortably?: Able to sit comfortably for hours (was able to sit for 15 minutes) How long can you stand comfortably?: Able to stand comfortably equal stance for unlimited amount of time (was immediately puts the weight onto his Lt leg.  ) How long can you walk comfortably?: Able to walk for 2-3 hours with decreased swelling following (was able to walk for two hours but was very swollen after) Pain Assessment Currently in Pain?: No/denies  Sensation/Coordination/Flexibility/Functional Tests Functional Tests Functional Tests: LEFS 63/80 78% (was 15/80)  Assessment RLE AROM (degrees) Right Knee Extension: 0 (was 10) Right Knee Flexion: 133 (was 130) RLE Strength Right Hip Flexion: 5/5 (was 2+/5) Right Hip Extension: 5/5 (was 4/5) Right Hip ABduction: 5/5 (was 5/5) Right Hip ADduction: 5/5 (was 3+/5) Right Knee Flexion: 5/5 (was 4/5) Right Knee Extension: 5/5 (was 3-/5) Right Ankle Dorsiflexion: 5/5 (was 5/5)  Exercise/Treatments Machines for Strengthening Cybex Knee Extension: 2 PL Rt LE 15x Cybex Knee Flexion: 5 Pl x 15 R LE only Standing Lateral Step Up: Right;10 reps;Hand Hold: 0;Step  Height: 6" Forward Step Up: Right;10 reps;Hand Hold: 0;Step Height: 6" Step Down: Right;10 reps;Hand Hold: 0;Step Height: 6" Wall Squat: 10 reps;5 seconds Stairs: 1RT reciprocal pattern  SLS with Vectors: 5x5"    Physical Therapy Assessment and Plan PT Assessment and Plan Clinical Impression Statement: Re-eval complete.  Mr. Sharman has had 7 OPPT sessions over 2 weeks with the following findings:  Pt independent with current HEP and able to demonstrate appropriate technqiues with all exercises.  Pt states knee pain free except for catch he gets during gait with knee flexion.  Improved AROM 0-135, strength 5/5 for all Rt LE musculature, increased perceived LE functonal scale with ability to sit, stand and walk unlimited amount of time and ability to ascend and descend stairs reciprocally without difficulty.  Pt given advanced HEP and able to demonstrate approraite technique with all new exercises.  Pt enrolled in [facility] and plans to advance his exercises.   PT Plan: Recommend D/C to HEP per all goals met.     Goals Home Exercise Program Pt/caregiver will Perform Home Exercise Program: For increased ROM;For increased strengthening PT Goal: Perform Home Exercise Program - Progress: Met PT Short Term Goals Time to Complete Short Term Goals: 2 weeks PT Short Term Goal 1: Pain to be no greater than a 5/10 80% of the day: Met PT Short Term Goal 2: Pt to ROM to be 0-105 to allow more normalized walking  Met PT Short Term Goal 3: Pt to be able to tolerate walking for an hour without increased swelling of pain.: Met PT Short Term Goal 4: Pt to be able to sit for a half hour  to be able to enjoy eating a meal: Met PT Short Term Goal 5: strength to be improved 1/2 grade to be able to get in and out of a car with ease.: Met PT Long Term Goals Time to Complete Long Term Goals: 4 weeks PT Long Term Goal 1: Pt to be I in advance HEP: Progressing toward goal PT Long Term Goal 2: Pt ROM to be to 120 to allow  pt to squat down to pick items off the floor: Met Long Term Goal 3: Pt strength to be improved by one grade to be able to rise from squatting position et Long Term Goal 4: Pt to be able to go up and down steps reciprocally Met  Problem List Patient Active Problem List   Diagnosis Date Noted  . Stiffness of knee joint 01/29/2013  . Right leg weakness 01/29/2013  . Difficulty walking 01/29/2013  . Right lower quadrant abdominal pain 09/09/2011  . Abdominal tenderness, epigastric 12/21/2010  . ANXIETY 08/11/2008  . MITRAL REGURGITATION 08/11/2008  . CHRONIC OBSTRUCTIVE PULMONARY DISEASE 08/11/2008  . SCHATZKI'S RING 08/11/2008  . BACK PAIN, CHRONIC 08/11/2008  . BEE STING ALLERGY 08/11/2008  . DEPRESSION 08/08/2008  . GASTROESOPHAGEAL REFLUX DISEASE 08/08/2008  . HIATAL HERNIA 08/08/2008  . IBS 08/08/2008  . ANOREXIA 08/08/2008  . WEIGHT LOSS 08/08/2008  . Esophageal dysphagia 08/08/2008  . DIARRHEA 08/08/2008  . EPIGASTRIC PAIN 08/08/2008  . Hematochezia 08/08/2008  . DYSLIPIDEMIA 10/22/2007  . OBSTRUCTIVE SLEEP APNEA 10/22/2007  . HYPERTENSION 10/22/2007  . ALLERGIC RHINITIS 10/22/2007  . ASTHMA 10/22/2007    PT - End of Session Activity Tolerance: Patient tolerated treatment well General Behavior During Therapy: WFL for tasks assessed/performed  GP Functional Assessment Tool Used: LEFS 63/80 Functional Limitation: Mobility: Walking and moving around Mobility: Walking and Moving Around Goal Status [phone]): At least 20 percent but less than 40 percent impaired, limited or restricted Mobility: Walking and Moving Around Discharge Status [phone]): At least 1 percent but less than 20 percent impaired, limited or restricted  Juel Burrow, PTA 02/13/2013, 11:25 AM  Physician Documentation Your signature is required to indicate approval of the treatment plan as stated above.  Please sign and either send electronically or make a copy of this report for your files and  return this physician signed original.   Please mark one 1.__approve of plan  2. ___approve of plan with the following conditions.   ______________________________                                                          _____________________ Physician Signature                                                                                                             Date

## 2013-02-15 ENCOUNTER — Inpatient Hospital Stay (HOSPITAL_COMMUNITY): Admission: RE | Admit: 2013-02-15 | Payer: Medicare Other | Source: Ambulatory Visit

## 2013-02-18 ENCOUNTER — Ambulatory Visit (HOSPITAL_COMMUNITY): Payer: Medicare Other | Admitting: Physical Therapy

## 2013-02-20 ENCOUNTER — Ambulatory Visit (HOSPITAL_COMMUNITY): Payer: Medicare Other | Admitting: *Deleted

## 2013-02-22 ENCOUNTER — Encounter: Payer: Self-pay | Admitting: *Deleted

## 2013-02-22 ENCOUNTER — Ambulatory Visit (HOSPITAL_COMMUNITY): Payer: Medicare Other | Admitting: Physical Therapy

## 2013-02-27 ENCOUNTER — Ambulatory Visit (INDEPENDENT_AMBULATORY_CARE_PROVIDER_SITE_OTHER): Payer: Medicare Other | Admitting: Internal Medicine

## 2013-02-27 ENCOUNTER — Encounter: Payer: Self-pay | Admitting: Internal Medicine

## 2013-02-27 VITALS — BP 126/72 | HR 62 | Ht 66.0 in | Wt 198.6 lb

## 2013-02-27 DIAGNOSIS — I351 Nonrheumatic aortic (valve) insufficiency: Secondary | ICD-10-CM

## 2013-02-27 DIAGNOSIS — R079 Chest pain, unspecified: Secondary | ICD-10-CM

## 2013-02-27 DIAGNOSIS — I10 Essential (primary) hypertension: Secondary | ICD-10-CM

## 2013-02-27 DIAGNOSIS — E785 Hyperlipidemia, unspecified: Secondary | ICD-10-CM

## 2013-02-27 DIAGNOSIS — G4733 Obstructive sleep apnea (adult) (pediatric): Secondary | ICD-10-CM

## 2013-02-27 DIAGNOSIS — R262 Difficulty in walking, not elsewhere classified: Secondary | ICD-10-CM

## 2013-02-27 DIAGNOSIS — J4489 Other specified chronic obstructive pulmonary disease: Secondary | ICD-10-CM

## 2013-02-27 DIAGNOSIS — J449 Chronic obstructive pulmonary disease, unspecified: Secondary | ICD-10-CM

## 2013-02-27 DIAGNOSIS — R0602 Shortness of breath: Secondary | ICD-10-CM

## 2013-02-27 DIAGNOSIS — I359 Nonrheumatic aortic valve disorder, unspecified: Secondary | ICD-10-CM

## 2013-02-27 NOTE — Progress Notes (Signed)
OFFICE NOTE  Chief Complaint:  New onset chest pain  Primary Care Physician: Kirk Ruths, MD  HPI:  [name]  is a 62 year old gentleman with a history of hypertension, depression, COPD, sleep apnea, GERD and dyslipidemia. He also has mild to moderate coronary disease with a 40% proximal to mid-LAD lesion in 2006. He has some mild aortic insufficiency on echocardiogram, and that was repeated in October of 2012 and showed a preserved LVEF of greater than 55%, normal chamber size and function, mild mitral and aortic insufficiency, and mild to moderate tricuspid insufficiency. There was also mild pulmonary arterial hypertension, RVSP of 34 mmHg which is actually borderline. He recently underwent right knee surgery and has been recovering from this. He did seem to tolerate the surgery well and it was under general anesthesia. However since surgery he has noted some increasing chest tightness and pain. The symptoms are mostly substernal and radiates to the left chest and occasionally to the left arm. It seemed to occur more with exertion but can occur at rest. He does not note any change when using his inhaler. He does report occasional wheezing still related to his COPD, which is worse at night and does improve with his inhaler. His chest pain is increasing with intensity and seems to be coming more frequently.  Unfortunately continues to smoke and is not yet ready to quit because he is around too many people that smoke.  PMHx:  Past Medical History  Diagnosis Date  . Hypertension   . COPD (chronic obstructive pulmonary disease)   . Dyslipidemia   . Allergic rhinitis   . Asthma   . Anxiety and depression   . GERD (gastroesophageal reflux disease)   . IBS (irritable bowel syndrome)   . Mitral regurgitation   . Langerhan's cell histiocytosis   . Chronic low back pain   . Hearing loss   . Hyperplastic colon polyp 12/28/10  . Tubular adenoma 12/28/10  . Hemorrhoids   . OSA on  CPAP   . CAD (coronary artery disease)     mild to mod - LAD lesion in 2006  . History of nuclear stress test 08/2009    bruce protocol; negative for ischemia    Past Surgical History  Procedure Laterality Date  . Inguinal hernia repair      left  . Ear pinna reconstruction w/ rib graft      right  . Cholecystectomy    . Tonsillectomy    . Foot surgery      right  . Lung biopsy  2009    right  . Colonoscopy  2007    friable anal canal, hyperplastic polyp  . Esophagogastroduodenoscopy      multiple dilations, last EGD 2007 showed small hh, adenomatous appearing gastric mucosa in body but biopsies benign. SB bx negative for Celiac.  Marland Kitchen Esophagogastroduodenoscopy  12/28/2010    Dr. Jena Gauss- normal esophagus s/p dilationpatchy erythema and erosions.  Elease Hashimoto dilation  12/28/2010    Procedure: Elease Hashimoto DILATION;  Surgeon: Corbin Ade, MD;  Location: [facility];  Service: Endoscopy;  Laterality: N/A;  . Colonoscopy  12/28/2010    Dr. Jena Gauss- tubular adenoma, hyperplastic polyp  . Transthoracic echocardiogram  02/2011    EF =>55%, normal chamber size & function; mild mitral and aortic insuff, mild-mod tricuspid insuff; mild pulm htn with rsvp of    FAMHx:  Family History  Problem Relation Age of Onset  . Colon cancer Neg Hx   .  Liver disease Neg Hx   . Inflammatory bowel disease Neg Hx   . Heart disease Father 58    etoh/breathing problmes  . GI problems Mother   . Hyperlipidemia Mother   . Heart attack Maternal Grandmother     also valvular problems  . Heart attack Maternal Grandfather   . Diabetes Paternal Grandmother   . Heart Problems Paternal Grandfather   . Heart attack Brother     stenting x3, also cancer  . Skin cancer Brother   . Hypertension Brother   . Hypertension Sister     SOCHx:   reports that he has been smoking Cigarettes.  He has a 44 pack-year smoking history. He has never used smokeless tobacco. He reports that he does not drink alcohol or use  illicit drugs.  ALLERGIES:  Allergies  Allergen Reactions  . Bee Venom Swelling    ROS: A comprehensive review of systems was negative except for: Constitutional: positive for fatigue Respiratory: positive for chronic bronchitis, dyspnea on exertion and wheezing Cardiovascular: positive for exertional chest pressure/discomfort Musculoskeletal: positive for knee pain  HOME MEDS: Current Outpatient Prescriptions  Medication Sig Dispense Refill  . albuterol (PROVENTIL HFA;VENTOLIN HFA) 108 (90 BASE) MCG/ACT inhaler Inhale 2 puffs into the lungs every 6 (six) hours as needed for wheezing.      Marland Kitchen albuterol (PROVENTIL,VENTOLIN) 90 MCG/ACT inhaler Inhale 2 puffs into the lungs every 6 (six) hours as needed.        . ALPRAZolam (XANAX) 1 MG tablet Take 1 mg by mouth at bedtime as needed.        Marland Kitchen aspirin 81 MG tablet Take 81 mg by mouth daily.        . cyclobenzaprine (FLEXERIL) 10 MG tablet Take 10 mg by mouth 3 (three) times daily as needed.        . fenofibrate (TRICOR) 145 MG tablet Take 145 mg by mouth daily.        . fish oil-omega-3 fatty acids 1000 MG capsule Take 2 g by mouth daily.        Marland Kitchen glucosamine-chondroitin 500-400 MG tablet Take 1 tablet by mouth 2 (two) times daily.      Marland Kitchen HYDROcodone-acetaminophen (NORCO) 10-325 MG per tablet Take 1 tablet by mouth every 6 (six) hours as needed for pain.      . metoprolol tartrate (LOPRESSOR) 25 MG tablet Take 25 mg by mouth 2 (two) times daily.        . Multiple Vitamin (MULTIVITAMIN) capsule Take 1 capsule by mouth daily.        . nitroGLYCERIN (NITROSTAT) 0.4 MG SL tablet Place 0.4 mg under the tongue every 5 (five) minutes as needed for chest pain.      . NON FORMULARY at bedtime. CPAP      . omeprazole (PRILOSEC) 20 MG capsule Take 1 capsule (20 mg total) by mouth 2 (two) times daily.  60 capsule  0  . omeprazole (PRILOSEC) 20 MG capsule Take 20 mg by mouth 2 (two) times daily.      . simvastatin (ZOCOR) 20 MG tablet Take 20 mg by  mouth at bedtime.        . Tiotropium Bromide Monohydrate (SPIRIVA HANDIHALER IN) Inhale into the lungs daily.      . valsartan (DIOVAN) 160 MG tablet Take 80 mg by mouth daily.        No current facility-administered medications for this visit.    LABS/IMAGING: No results found for this or any previous  visit (from the past 48 hour(s)). No results found.  VITALS: BP 126/72  Pulse 62  Ht 5\' 6"  (1.676 m)  Wt 198 lb 9.6 oz (90.084 kg)  BMI 32.07 kg/m2  EXAM: General appearance: alert and no distress Neck: no adenopathy, no carotid bruit, no JVD, supple, symmetrical, trachea midline and thyroid not enlarged, symmetric, no tenderness/mass/nodules Lungs: diminished breath sounds bilaterally and wheezes apex - faint expiratory Heart: regular rate and rhythm, S1, S2 normal, systolic murmur: early systolic 2/6, crescendo at apex and diastolic murmur: mid diastolic 2/6, blowing at 2nd right intercostal space Abdomen: soft, non-tender; bowel sounds normal; no masses,  no organomegaly Extremities: extremities normal, atraumatic, no cyanosis or edema Pulses: 2+ and symmetric Skin: Skin color, texture, turgor normal. No rashes or lesions Neurologic: Grossly normal Psych: Does not appear anxious  EKG: Normal sinus rhythm at 62  ASSESSMENT: 1. Exertional chest pain-concern for unstable angina 2. Known coronary artery disease with a 40% LAD stenosis in 2006 3. Ongoing tobacco abuse 4. COPD - minimal wheezing 5. Mild aortic and mitral insufficiency - no change in murmur intensity 6. Dyslipidemia - recently checked by PCP and at goal 7. Hypertension - at goal on current hypertensives, no changes 8. Obstructive sleep apnea on CPAP  PLAN: 1.   Mr. Flythe is having chest pain which could be unstable angina. It seems to come more frequently in the past few weeks. He does have multiple coronary risk factors however he did make it through a knee surgery uneventfully. His last stress test was in  2011 and was nonischemic. This is an exercise study, however given his recent knee surgery he will not be able to walk on a treadmill. I would recommend a LexiScan nuclear stress test to evaluate for any progression of his LAD coronary disease. We'll plan to see him back in 2 weeks to discuss results of that study. He is instructed to present to the emergency room if he has any further chest pain that does not go away.  Chrystie Nose, MD, [facility] Attending Cardiologist [facility]  Joclyn Alsobrook C 02/27/2013, 4:51 PM

## 2013-02-27 NOTE — Patient Instructions (Signed)
Your physician has requested that you have a lexiscan myoview. For further information please visit [email or web address]. Please follow instruction sheet, as given.  Your physician recommends that you schedule a follow-up appointment after the test. 

## 2013-02-28 ENCOUNTER — Encounter: Payer: Self-pay | Admitting: Internal Medicine

## 2013-03-06 ENCOUNTER — Ambulatory Visit (HOSPITAL_COMMUNITY)
Admission: RE | Admit: 2013-03-06 | Discharge: 2013-03-06 | Disposition: A | Discharge status: Home / Self Care | Payer: Medicare Other | Source: Ambulatory Visit | Attending: Cardiology | Admitting: Cardiology

## 2013-03-06 DIAGNOSIS — R0602 Shortness of breath: Secondary | ICD-10-CM | POA: Insufficient documentation

## 2013-03-06 DIAGNOSIS — R262 Difficulty in walking, not elsewhere classified: Secondary | ICD-10-CM

## 2013-03-06 DIAGNOSIS — R079 Chest pain, unspecified: Secondary | ICD-10-CM | POA: Insufficient documentation

## 2013-03-06 MED ORDER — AMINOPHYLLINE 25 MG/ML IV SOLN
75.0000 mg | Freq: Once | INTRAVENOUS | Status: AC
  Administered 2013-03-06: 75 mg via INTRAVENOUS

## 2013-03-06 MED ORDER — TECHNETIUM TC 99M SESTAMIBI GENERIC - CARDIOLITE
30.0000 | Freq: Once | INTRAVENOUS | Status: AC | PRN
  Administered 2013-03-06: 30 via INTRAVENOUS

## 2013-03-06 MED ORDER — TECHNETIUM TC 99M SESTAMIBI GENERIC - CARDIOLITE
10.0000 | Freq: Once | INTRAVENOUS | Status: AC | PRN
  Administered 2013-03-06: 10 via INTRAVENOUS

## 2013-03-06 MED ORDER — REGADENOSON 0.4 MG/5ML IV SOLN
0.4000 mg | Freq: Once | INTRAVENOUS | Status: AC
  Administered 2013-03-06: 0.4 mg via INTRAVENOUS

## 2013-03-06 NOTE — Procedures (Addendum)
[facility] [address] [place] [address] [phone]  Cardiology Nuclear Med Study  [name] is a 62 y.o. male     MRN : [id number]     DOB: [birth year]  Procedure Date: 03/06/2013  Nuclear Med Background Indication for Stress Test:  Evaluation for Ischemia History:  Asthma, COPD and CAD Cardiac Risk Factors: Family History - CAD, Hypertension, Lipids, Overweight and Smoker  Symptoms:  Chest Pain, Dizziness, DOE, Fatigue, Light-Headedness, Palpitations and SOB   Nuclear Pre-Procedure Caffeine/Decaff Intake:  9:00pm NPO After: 7:00am   IV Site: R Hand  IV 0.9% NS with Angio Cath:  22g  Chest Size (in):  40"  IV Started by: Emmit Pomfret, RN  Height: 5\' 6"  (1.676 m)  Cup Size: n/a  BMI:  Body mass index is 31.97 kg/(m^2). Weight:  198 lb (89.812 kg)   Tech Comments:  N/A    Nuclear Med Study 1 or 2 day study: 1 day  Stress Test Type:  Lexiscan  Order Authorizing Provider:  Josephina Shih, MD   Resting Radionuclide: Technetium 42m Sestamibi  Resting Radionuclide Dose: 11.0 mCi   Stress Radionuclide:  Technetium 32m Sestamibi  Stress Radionuclide Dose: 31.9 mCi           Stress Protocol Rest HR: 60 Stress HR: 82  Rest BP: 106/74 Stress BP: 95/79  Exercise Time (min): n/a METS: n/a   Predicted Max HR: 161 bpm % Max HR: 50.93 bpm Rate Pressure Product: 9512  Dose of Adenosine (mg):  n/a Dose of Lexiscan: 0.4 mg  Dose of Atropine (mg): n/a Dose of Dobutamine: n/a mcg/kg/min (at max HR)  Stress Test Technologist: Esperanza Sheets, CCT Nuclear Technologist: Koren Shiver, CNMT   Rest Procedure:  Myocardial perfusion imaging was performed at rest 45 minutes following the intravenous administration of Technetium 30m Sestamibi. Stress Procedure:  The patient received IV Lexiscan 0.4 mg over 15-seconds.  Technetium 71m Sestamibi injected at 30-seconds.  The patient experienced SOB, Cough and throat burning;  75 mg of IV Aminophylline was administered with resolution of symptoms. There were no significant changes with Lexiscan.  Quantitative spect images were obtained after a 45 minute delay.  Transient Ischemic Dilatation (Normal <1.22):  1.02 Lung/Heart Ratio (Normal <0.45):  0.31 QGS EDV:  99 ml QGS ESV:  38 ml LV Ejection Fraction: 62%  Signed by Koren Shiver, CNMT  PHYSICIAN INTERPRETATION  Rest ECG: NSR with non-specific ST-T wave changes  Stress ECG: No significant change from baseline ECG.  No significant ST segment change suggestive of ischemia.  QPS Raw Data Images:  There is interference from nuclear activity from structures below the diaphragm. This does not affect the ability to read the study.  The remainder of the LV has homogeneous tracer uptake. Stress Images:  There appears to be decreased uptake along the basal septal wall, in a non-vascular territory beyond a proximal septal perforator. Rest Images:  There is decreased uptake in the septum. - less pronounced. Subtraction (SDS):  The observed defect is most likely related to misalignment of the images; however focal basal septal ischemia cannot be excluded.  Impression Exercise Capacity:  Lexiscan with no exercise. BP Response:  Initially hypotensive response, with rapid recovery. Clinical Symptoms:  There is dyspnea. + Cough ECG Impression:  No significant ECG changes with Lexiscan. LV Wall Motion:  NL LV Function; NL Wall Motion  Comparison with Prior Nuclear Study: No significant change from previous study; but images are  black & white.  Overall Impression:  Low risk stress nuclear study with a small area of basal septal perfusion defect.  Clinical correlation is recommended.Marland Kitchen   Marykay Lex, MD  03/06/2013 1:22 PM

## 2013-03-12 ENCOUNTER — Ambulatory Visit: Payer: Medicare Other | Admitting: Internal Medicine

## 2013-03-21 ENCOUNTER — Encounter: Payer: Self-pay | Admitting: Internal Medicine

## 2013-03-21 ENCOUNTER — Ambulatory Visit (INDEPENDENT_AMBULATORY_CARE_PROVIDER_SITE_OTHER): Payer: Medicare Other | Admitting: Internal Medicine

## 2013-03-21 ENCOUNTER — Telehealth: Payer: Self-pay | Admitting: *Deleted

## 2013-03-21 ENCOUNTER — Ambulatory Visit
Admission: RE | Admit: 2013-03-21 | Discharge: 2013-03-21 | Disposition: A | Discharge status: Home / Self Care | Payer: Medicare Other | Source: Ambulatory Visit | Attending: Internal Medicine | Admitting: Internal Medicine

## 2013-03-21 VITALS — BP 128/62 | HR 60 | Ht 66.0 in | Wt 196.3 lb

## 2013-03-21 DIAGNOSIS — R079 Chest pain, unspecified: Secondary | ICD-10-CM

## 2013-03-21 DIAGNOSIS — J209 Acute bronchitis, unspecified: Secondary | ICD-10-CM

## 2013-03-21 DIAGNOSIS — J42 Unspecified chronic bronchitis: Secondary | ICD-10-CM

## 2013-03-21 DIAGNOSIS — J441 Chronic obstructive pulmonary disease with (acute) exacerbation: Secondary | ICD-10-CM

## 2013-03-21 MED ORDER — LEVOFLOXACIN 500 MG PO TABS: 500.0000 mg | ORAL_TABLET | Freq: Every day | ORAL | Status: DC

## 2013-03-21 MED ORDER — NITROGLYCERIN 0.4 MG SL SUBL: 0.4000 mg | SUBLINGUAL_TABLET | SUBLINGUAL | Status: AC | PRN

## 2013-03-21 NOTE — Progress Notes (Signed)
OFFICE NOTE  Chief Complaint:  New onset chest pain  Primary Care Physician: Kirk Ruths, MD  HPI:  [name]  is a 62 year old gentleman with a history of hypertension, depression, COPD, sleep apnea, GERD and dyslipidemia. He also has mild to moderate coronary disease with a 40% proximal to mid-LAD lesion in 2006. He has some mild aortic insufficiency on echocardiogram, and that was repeated in October of 2012 and showed a preserved LVEF of greater than 55%, normal chamber size and function, mild mitral and aortic insufficiency, and mild to moderate tricuspid insufficiency. There was also mild pulmonary arterial hypertension, RVSP of 34 mmHg which is actually borderline. He recently underwent right knee surgery and has been recovering from this. He did seem to tolerate the surgery well and it was under general anesthesia. However since surgery he has noted some increasing chest tightness and pain. The symptoms are mostly substernal and radiates to the left chest and occasionally to the left arm. It seemed to occur more with exertion but can occur at rest. He does not note any change when using his inhaler. He does report occasional wheezing still related to his COPD, which is worse at night and does improve with his inhaler. His chest pain is increasing with intensity and seems to be coming more frequently.  Unfortunately continues to smoke and is not yet ready to quit because he is around too many people that smoke.  He is referred for a nuclear stress test which are performed on 03/06/2013. This study was negative for ischemia. He does report his chest tightness is gotten better and it appears that he may have been developing an acute exacerbation of chronic bronchitis. He saw his primary care provider and was given Augmentin and a steroid injection. Subsequently he returned and continues to have cough and poor air flow movement. He was given oral prednisone with a taper. He  continues to take prednisone but is reported little improvement in his symptoms over the past 3 weeks.  PMHx:  Past Medical History  Diagnosis Date  . Hypertension   . COPD (chronic obstructive pulmonary disease)   . Dyslipidemia   . Allergic rhinitis   . Asthma   . Anxiety and depression   . GERD (gastroesophageal reflux disease)   . IBS (irritable bowel syndrome)   . Mitral regurgitation   . Langerhan's cell histiocytosis   . Chronic low back pain   . Hearing loss   . Hyperplastic colon polyp 12/28/10  . Tubular adenoma 12/28/10  . Hemorrhoids   . OSA on CPAP   . CAD (coronary artery disease)     mild to mod - LAD lesion in 2006  . History of nuclear stress test 08/2009    bruce protocol; negative for ischemia    Past Surgical History  Procedure Laterality Date  . Inguinal hernia repair      left  . Ear pinna reconstruction w/ rib graft      right  . Cholecystectomy    . Tonsillectomy    . Foot surgery      right  . Lung biopsy  2009    right  . Colonoscopy  2007    friable anal canal, hyperplastic polyp  . Esophagogastroduodenoscopy      multiple dilations, last EGD 2007 showed small hh, adenomatous appearing gastric mucosa in body but biopsies benign. SB bx negative for Celiac.  Marland Kitchen Esophagogastroduodenoscopy  12/28/2010    Dr. Jena Gauss- normal esophagus s/p  dilationpatchy erythema and erosions.  Elease Hashimoto dilation  12/28/2010    Procedure: Elease Hashimoto DILATION;  Surgeon: Corbin Ade, MD;  Location: [facility];  Service: Endoscopy;  Laterality: N/A;  . Colonoscopy  12/28/2010    Dr. Jena Gauss- tubular adenoma, hyperplastic polyp  . Transthoracic echocardiogram  02/2011    EF =>55%, normal chamber size & function; mild mitral and aortic insuff, mild-mod tricuspid insuff; mild pulm htn with rsvp of    FAMHx:  Family History  Problem Relation Age of Onset  . Colon cancer Neg Hx   . Liver disease Neg Hx   . Inflammatory bowel disease Neg Hx   . Heart disease Father 72      etoh/breathing problmes  . GI problems Mother   . Hyperlipidemia Mother   . Heart attack Maternal Grandmother     also valvular problems  . Heart attack Maternal Grandfather   . Diabetes Paternal Grandmother   . Heart Problems Paternal Grandfather   . Heart attack Brother     stenting x3, also cancer  . Skin cancer Brother   . Hypertension Brother   . Hypertension Sister     SOCHx:   reports that he has been smoking Cigarettes.  He has a 44 pack-year smoking history. He has never used smokeless tobacco. He reports that he does not drink alcohol or use illicit drugs.  ALLERGIES:  Allergies  Allergen Reactions  . Bee Venom Swelling    ROS: A comprehensive review of systems was negative except for: Constitutional: positive for fatigue Respiratory: positive for chronic bronchitis, dyspnea on exertion and wheezing Cardiovascular: positive for exertional chest pressure/discomfort Musculoskeletal: positive for knee pain  HOME MEDS: Current Outpatient Prescriptions  Medication Sig Dispense Refill  . albuterol (PROVENTIL HFA;VENTOLIN HFA) 108 (90 BASE) MCG/ACT inhaler Inhale 2 puffs into the lungs every 6 (six) hours as needed for wheezing.      Marland Kitchen albuterol (PROVENTIL,VENTOLIN) 90 MCG/ACT inhaler Inhale 2 puffs into the lungs every 6 (six) hours as needed.        . ALPRAZolam (XANAX) 1 MG tablet Take 1 mg by mouth at bedtime as needed.        Marland Kitchen aspirin 81 MG tablet Take 81 mg by mouth daily.        . fenofibrate (TRICOR) 145 MG tablet Take 145 mg by mouth daily.        . fish oil-omega-3 fatty acids 1000 MG capsule Take 2 g by mouth daily.        Marland Kitchen glucosamine-chondroitin 500-400 MG tablet Take 1 tablet by mouth 2 (two) times daily.      Marland Kitchen HYDROcodone-acetaminophen (NORCO) 10-325 MG per tablet Take 1 tablet by mouth every 6 (six) hours as needed for pain.      . metoprolol tartrate (LOPRESSOR) 25 MG tablet Take 25 mg by mouth 2 (two) times daily.        . Multiple Vitamin  (MULTIVITAMIN) capsule Take 1 capsule by mouth daily.        . nitroGLYCERIN (NITROSTAT) 0.4 MG SL tablet Place 1 tablet (0.4 mg total) under the tongue every 5 (five) minutes as needed for chest pain.  25 tablet  3  . NON FORMULARY at bedtime. CPAP      . omeprazole (PRILOSEC) 20 MG capsule Take 1 capsule (20 mg total) by mouth 2 (two) times daily.  60 capsule  0  . predniSONE (DELTASONE) 5 MG tablet Take 5 mg by mouth. Tapered dosing      .  simvastatin (ZOCOR) 20 MG tablet Take 20 mg by mouth at bedtime.        . valsartan (DIOVAN) 160 MG tablet Take 80 mg by mouth daily.        No current facility-administered medications for this visit.    LABS/IMAGING: No results found for this or any previous visit (from the past 48 hour(s)). No results found.  VITALS: BP 128/62  Pulse 60  Ht 5\' 6"  (1.676 m)  Wt 196 lb 4.8 oz (89.041 kg)  BMI 31.7 kg/m2  EXAM: General appearance: alert and no distress Neck: no adenopathy, no carotid bruit, no JVD, supple, symmetrical, trachea midline and thyroid not enlarged, symmetric, no tenderness/mass/nodules Lungs: poor airflow, consolidation in the left base with E/A changes, upper airway end-expiratory wheezes Heart: regular rate and rhythm, S1, S2 normal, systolic murmur: early systolic 2/6, crescendo at apex and diastolic murmur: mid diastolic 2/6, blowing at 2nd right intercostal space Abdomen: soft, non-tender; bowel sounds normal; no masses,  no organomegaly Extremities: extremities normal, atraumatic, no cyanosis or edema Pulses: 2+ and symmetric Skin: Skin color, texture, turgor normal. No rashes or lesions Neurologic: Grossly normal Psych: Does not appear anxious  EKG: deferred  ASSESSMENT: 1. Exertional chest pain-negative nuclear stress test 2. Known coronary artery disease with a 40% LAD stenosis in 2006 3. Ongoing tobacco abuse 4. COPD - probably acute exacerbation, possibly pneumonia. 5. Mild aortic and mitral insufficiency - no  change in murmur intensity 6. Dyslipidemia - recently checked by PCP and at goal 7. Hypertension - at goal on current hypertensives, no changes 8. Obstructive sleep apnea on CPAP  PLAN: 1.   Mr. Luft chest pain is improved, and this may been related to a COPD exacerbation. His nuclear stress test was negative forward for support perfusion defects. His symptoms however continued to be poor with marked dyspnea exertion, cough, upper airway congestion and fullness in the chest. He has received Augmentin, injected steroids and a oral steroid taper which he is continuing to take. I would like her lab an underlying pneumonia and have recommended a chest x-ray. If this does demonstrate findings concerning for pneumonia, would recommend a stronger antibiotic such as Levaquin or moxifloxacin.  He should consider a pulmonary referral.  Chrystie Nose, MD, [facility] Attending Cardiologist [facility]  HILTY,Kenneth C 03/21/2013, 1:51 PM

## 2013-03-21 NOTE — Telephone Encounter (Signed)
Message copied by Lindell Spar on Thu Mar 21, 2013  4:00 PM ------      Message from: [name]      Created: Thu Mar 21, 2013  3:56 PM       Please notify patient that CXR shows some right upper lobe scarring, no pneumonia.             -Dr. Rennis Golden       ------

## 2013-03-21 NOTE — Telephone Encounter (Signed)
Called patient with CXR results - informed of new abx per Dr Rennis Golden and that it would be called into pharmacy. Patient agreed with plan.

## 2013-03-21 NOTE — Patient Instructions (Addendum)
Please go have a chest x-ray at [address] [facility]) We will call you with the results.  Refills for nitroglycerin have been sent to your pharmacy.   Your physician wants you to follow-up in: 6 months. You will receive a reminder letter in the mail two months in advance. If you don't receive a letter, please call our office to schedule the follow-up appointment.

## 2013-10-22 ENCOUNTER — Encounter: Payer: Self-pay | Admitting: Internal Medicine

## 2013-10-22 ENCOUNTER — Other Ambulatory Visit: Payer: Self-pay | Admitting: *Deleted

## 2013-10-22 ENCOUNTER — Ambulatory Visit (INDEPENDENT_AMBULATORY_CARE_PROVIDER_SITE_OTHER): Payer: Medicare Other | Admitting: Internal Medicine

## 2013-10-22 VITALS — BP 120/82 | HR 56 | Ht 68.0 in | Wt 197.8 lb

## 2013-10-22 DIAGNOSIS — I10 Essential (primary) hypertension: Secondary | ICD-10-CM

## 2013-10-22 DIAGNOSIS — I351 Nonrheumatic aortic (valve) insufficiency: Secondary | ICD-10-CM

## 2013-10-22 DIAGNOSIS — J449 Chronic obstructive pulmonary disease, unspecified: Secondary | ICD-10-CM

## 2013-10-22 DIAGNOSIS — E785 Hyperlipidemia, unspecified: Secondary | ICD-10-CM

## 2013-10-22 DIAGNOSIS — I359 Nonrheumatic aortic valve disorder, unspecified: Secondary | ICD-10-CM

## 2013-10-22 DIAGNOSIS — J438 Other emphysema: Secondary | ICD-10-CM | POA: Insufficient documentation

## 2013-10-22 DIAGNOSIS — R0602 Shortness of breath: Secondary | ICD-10-CM

## 2013-10-22 DIAGNOSIS — I080 Rheumatic disorders of both mitral and aortic valves: Secondary | ICD-10-CM

## 2013-10-22 DIAGNOSIS — I2510 Atherosclerotic heart disease of native coronary artery without angina pectoris: Secondary | ICD-10-CM

## 2013-10-22 DIAGNOSIS — G4733 Obstructive sleep apnea (adult) (pediatric): Secondary | ICD-10-CM

## 2013-10-22 MED ORDER — METOPROLOL TARTRATE 25 MG PO TABS: 25.0000 mg | ORAL_TABLET | Freq: Two times a day (BID) | ORAL | Status: DC

## 2013-10-22 NOTE — Progress Notes (Signed)
OFFICE NOTE  Chief Complaint:  New onset chest pain  Primary Care Physician: Leonides Grills, MD  HPI:  [name]  is a 63 year old gentleman with a history of hypertension, depression, COPD, sleep apnea, GERD and dyslipidemia. He also has mild to moderate coronary disease with a 40% proximal to mid-LAD lesion in 2006. He has some mild aortic insufficiency on echocardiogram, and that was repeated in October of 2012 and showed a preserved LVEF of greater than 55%, normal chamber size and function, mild mitral and aortic insufficiency, and mild to moderate tricuspid insufficiency. There was also mild pulmonary arterial hypertension, RVSP of 34 mmHg which is actually borderline. He recently underwent right knee surgery and has been recovering from this. He did seem to tolerate the surgery well and it was under general anesthesia. However since surgery he has noted some increasing chest tightness and pain. The symptoms are mostly substernal and radiates to the left chest and occasionally to the left arm. It seemed to occur more with exertion but can occur at rest. He does not note any change when using his inhaler. He does report occasional wheezing still related to his COPD, which is worse at night and does improve with his inhaler. His chest pain is increasing with intensity and seems to be coming more frequently.  Unfortunately continues to smoke and is not yet ready to quit because he is around too many people that smoke.  He is referred for a nuclear stress test which are performed on 03/06/2013. This study was negative for ischemia. He continues to report some shortness of breath with exertion but not as significant. He has had a number of episodes of shortness of breath which I believe are exacerbations of chronic bronchitis and COPD. He only uses her rescue inhaler but fairly frequently. He continues to smoke but has cut back some.  PMHx:  Past Medical History  Diagnosis Date  .  Hypertension   . COPD (chronic obstructive pulmonary disease)   . Dyslipidemia   . Allergic rhinitis   . Asthma   . Anxiety and depression   . GERD (gastroesophageal reflux disease)   . IBS (irritable bowel syndrome)   . Mitral regurgitation   . Langerhan's cell histiocytosis   . Chronic low back pain   . Hearing loss   . Hyperplastic colon polyp 12/28/10  . Tubular adenoma 12/28/10  . Hemorrhoids   . OSA on CPAP   . CAD (coronary artery disease)     mild to mod - LAD lesion in 2006  . History of nuclear stress test 08/2009    bruce protocol; negative for ischemia    Past Surgical History  Procedure Laterality Date  . Inguinal hernia repair      left  . Ear pinna reconstruction w/ rib graft      right  . Cholecystectomy    . Tonsillectomy    . Foot surgery      right  . Lung biopsy  2009    right  . Colonoscopy  2007    friable anal canal, hyperplastic polyp  . Esophagogastroduodenoscopy      multiple dilations, last EGD 2007 showed small hh, adenomatous appearing gastric mucosa in body but biopsies benign. SB bx negative for Celiac.  Marland Kitchen Esophagogastroduodenoscopy  12/28/2010    Dr. Gala Romney- normal esophagus s/p dilationpatchy erythema and erosions.  Venia Minks dilation  12/28/2010    Procedure: Venia Minks DILATION;  Surgeon: Daneil Dolin, MD;  Location:  [facility];  Service: Endoscopy;  Laterality: N/A;  . Colonoscopy  12/28/2010    Dr. Gala Romney- tubular adenoma, hyperplastic polyp  . Transthoracic echocardiogram  02/2011    EF =>55%, normal chamber size & function; mild mitral and aortic insuff, mild-mod tricuspid insuff; mild pulm htn with rsvp of 31mmHg    FAMHx:  Family History  Problem Relation Age of Onset  . Colon cancer Neg Hx   . Liver disease Neg Hx   . Inflammatory bowel disease Neg Hx   . Heart disease Father 71    etoh/breathing problmes  . GI problems Mother   . Hyperlipidemia Mother   . Heart attack Maternal Grandmother     also valvular problems  . Heart  attack Maternal Grandfather   . Diabetes Paternal Grandmother   . Heart Problems Paternal Grandfather   . Heart attack Brother     stenting x3, also cancer  . Skin cancer Brother   . Hypertension Brother   . Hypertension Sister     SOCHx:   reports that he has been smoking Cigarettes.  He has a 44 pack-year smoking history. He has never used smokeless tobacco. He reports that he does not drink alcohol or use illicit drugs.  ALLERGIES:  Allergies  Allergen Reactions  . Bee Venom Swelling    ROS: A comprehensive review of systems was negative except for: Constitutional: positive for fatigue Respiratory: positive for chronic bronchitis, dyspnea on exertion and wheezing Cardiovascular: positive for exertional chest pressure/discomfort Musculoskeletal: positive for knee pain  HOME MEDS: Current Outpatient Prescriptions  Medication Sig Dispense Refill  . albuterol (PROVENTIL HFA;VENTOLIN HFA) 108 (90 BASE) MCG/ACT inhaler Inhale 2 puffs into the lungs every 6 (six) hours as needed for wheezing.      Marland Kitchen albuterol (PROVENTIL,VENTOLIN) 90 MCG/ACT inhaler Inhale 2 puffs into the lungs every 6 (six) hours as needed.        . ALPRAZolam (XANAX) 1 MG tablet Take 1 mg by mouth at bedtime as needed.        Marland Kitchen aspirin 81 MG tablet Take 81 mg by mouth daily.        . fenofibrate (TRICOR) 145 MG tablet Take 145 mg by mouth daily.        . fish oil-omega-3 fatty acids 1000 MG capsule Take 2 g by mouth daily.        Marland Kitchen glucosamine-chondroitin 500-400 MG tablet Take 1 tablet by mouth 2 (two) times daily.      Marland Kitchen HYDROcodone-acetaminophen (NORCO) 10-325 MG per tablet Take 1 tablet by mouth every 6 (six) hours as needed for pain.      Marland Kitchen levofloxacin (LEVAQUIN) 500 MG tablet Take 1 tablet (500 mg total) by mouth daily.  7 tablet  0  . metoprolol tartrate (LOPRESSOR) 25 MG tablet Take 25 mg by mouth 2 (two) times daily.        . Multiple Vitamin (MULTIVITAMIN) capsule Take 1 capsule by mouth daily.          . nitroGLYCERIN (NITROSTAT) 0.4 MG SL tablet Place 1 tablet (0.4 mg total) under the tongue every 5 (five) minutes as needed for chest pain.  25 tablet  3  . NON FORMULARY at bedtime. CPAP      . omeprazole (PRILOSEC) 20 MG capsule Take 1 capsule (20 mg total) by mouth 2 (two) times daily.  60 capsule  0  . simvastatin (ZOCOR) 20 MG tablet Take 20 mg by mouth at bedtime.        Marland Kitchen  valsartan (DIOVAN) 160 MG tablet Take 80 mg by mouth daily.        No current facility-administered medications for this visit.    LABS/IMAGING: No results found for this or any previous visit (from the past 48 hour(s)). No results found.  VITALS: BP 120/82  Pulse 56  Ht 5\' 8"  (1.727 m)  Wt 197 lb 12.8 oz (89.721 kg)  BMI 30.08 kg/m2  EXAM: General appearance: alert and no distress Neck: no adenopathy, no carotid bruit, no JVD, supple, symmetrical, trachea midline and thyroid not enlarged, symmetric, no tenderness/mass/nodules Lungs: poor airflow, consolidation in the left base with E/A changes, upper airway end-expiratory wheezes, rhonchi Heart: regular rate and rhythm, S1, S2 normal, systolic murmur: early systolic 2/6, crescendo at apex and diastolic murmur: mid diastolic 2/6, blowing at 2nd right intercostal space Abdomen: soft, non-tender; bowel sounds normal; no masses,  no organomegaly Extremities: extremities normal, atraumatic, no cyanosis or edema Pulses: 2+ and symmetric Skin: Skin color, texture, turgor normal. No rashes or lesions Neurologic: Grossly normal Psych: Does not appear anxious  EKG: Sinus bradycardia at 56  ASSESSMENT: 1. Exertional chest pain-negative nuclear stress test 2. Known coronary artery disease with a 40% LAD stenosis in 2006 3. Ongoing tobacco abuse 4. COPD - probably acute exacerbation, possibly pneumonia. 5. Mild aortic and mitral insufficiency - no change in murmur intensity 6. Dyslipidemia - recently checked by PCP and at goal 7. Hypertension - at goal on  current hypertensives, no changes 8. Obstructive sleep apnea on CPAP  PLAN: 1.   Mr. Harton chest pain is improved, and this may been related to a COPD exacerbation. His nuclear stress test was negative forward for support perfusion defects. His symptoms however continued to be poor with marked dyspnea exertion, cough, upper airway congestion and fullness in the chest.  He has had a number of exacerbations recently. He does not have a pulmonologist. I would like to obtain PFTs and refer him to pulmonary to see if he would benefit from being on Spiriva or a long-acting bronchodilator/inhaled corticosteroid combination.  In addition he has a history of sleep apnea on CPAP but he reports his machine is broken. His sleep study was over 3 or 4 years ago. He will need another overnight study and new equipment. I will refer her for that.  Plan to see him back in 6 months.  Pixie Casino, MD, [facility] Attending Cardiologist [place] 10/22/2013, 9:42 AM

## 2013-10-22 NOTE — Telephone Encounter (Signed)
Rx refill sent to patient pharmacy. Patient was seen in office today.

## 2013-10-22 NOTE — Patient Instructions (Signed)
Your physician has recommended that you have a pulmonary function test. Pulmonary Function Tests are a group of tests that measure how well air moves in and out of your lungs. ** this is done at [facility]   Dr. Debara Pickett as referred you to Dr. Lake Bells at [facility]  Dr. Debara Pickett has ordered a CPAP titration to be done at [facility].   Your physician wants you to follow-up in: 6 months. You will receive a reminder letter in the mail two months in advance. If you don't receive a letter, please call our office to schedule the follow-up appointment.

## 2013-10-25 ENCOUNTER — Ambulatory Visit (HOSPITAL_COMMUNITY)
Admission: RE | Admit: 2013-10-25 | Discharge: 2013-10-25 | Disposition: A | Discharge status: Home / Self Care | Payer: Medicare Other | Source: Ambulatory Visit | Attending: Internal Medicine | Admitting: Internal Medicine

## 2013-10-25 DIAGNOSIS — J449 Chronic obstructive pulmonary disease, unspecified: Secondary | ICD-10-CM

## 2013-10-25 DIAGNOSIS — R0602 Shortness of breath: Secondary | ICD-10-CM

## 2013-10-25 DIAGNOSIS — J4489 Other specified chronic obstructive pulmonary disease: Secondary | ICD-10-CM | POA: Insufficient documentation

## 2013-10-25 MED ORDER — ALBUTEROL SULFATE (2.5 MG/3ML) 0.083% IN NEBU
2.5000 mg | INHALATION_SOLUTION | Freq: Once | RESPIRATORY_TRACT | Status: AC
  Administered 2013-10-25: 2.5 mg via RESPIRATORY_TRACT

## 2013-11-07 ENCOUNTER — Other Ambulatory Visit: Payer: Medicare Other

## 2013-11-07 ENCOUNTER — Encounter: Payer: Self-pay | Admitting: Pulmonary Disease

## 2013-11-07 ENCOUNTER — Ambulatory Visit (INDEPENDENT_AMBULATORY_CARE_PROVIDER_SITE_OTHER): Payer: Medicare Other | Admitting: Pulmonary Disease

## 2013-11-07 VITALS — BP 134/72 | HR 64 | Ht 68.0 in | Wt 205.0 lb

## 2013-11-07 DIAGNOSIS — F17200 Nicotine dependence, unspecified, uncomplicated: Secondary | ICD-10-CM

## 2013-11-07 DIAGNOSIS — J441 Chronic obstructive pulmonary disease with (acute) exacerbation: Secondary | ICD-10-CM

## 2013-11-07 DIAGNOSIS — Z23 Encounter for immunization: Secondary | ICD-10-CM

## 2013-11-07 DIAGNOSIS — J42 Unspecified chronic bronchitis: Secondary | ICD-10-CM

## 2013-11-07 MED ORDER — TIOTROPIUM BROMIDE MONOHYDRATE 18 MCG IN CAPS: 18.0000 ug | ORAL_CAPSULE | Freq: Every day | RESPIRATORY_TRACT | Status: DC

## 2013-11-07 NOTE — Assessment & Plan Note (Addendum)
He has GOLD Grade C based on his frequent exacerbations.    He currently does not take his medications appropriately. It is unclear if he is getting the full benefit of Spiriva because he only uses it on as as-needed basis.   Combined recommendations from the [place], [employer], Investment banker, corporate, [place] (Qaseem A et al, Ann Intern Med. 2011;155(3):179) recommends tobacco cessation, pulmonary rehab (for symptomatic patients with an FEV1 < 50% predicted), supplemental oxygen (for patients with SaO2 <88% or paO2 <55), and appropriate bronchodilator therapy.  In regards to long acting bronchodilators, they recommend monotherapy (FEV1 60-80% with symptoms weak evidence, FEV1 with symptoms <60% strong evidence), or combination therapy (FEV1 <60% with symptoms, strong recommendation, moderate evidence).  One should also provide patients with annual immunizations and consider therapy for prevention of COPD exacerbations (ie. roflumilast or azithromycin) when appopriate.  -O2 therapy: Not indicated -Immunizations: Prevnar today, Pneumovax 2011, Flu in fall -Tobacco use: Educated at length to quit -Exercise: encouraged regular exercise -Bronchodilator therapy: Use Spiriva daily, if no improvement in one week, then trial Breo in addition to Spiriva -Exacerbation prevention: Spiriva, Breo, quit smoking

## 2013-11-07 NOTE — Patient Instructions (Signed)
Quit smoking Call 1-800-QUIT-NOW Use Spiriva daily no matter how you feel Take the Breo daily if you don't feel better with the Spiriva Exercise regularly Get a flu shot in the fall We will see you back in 3 months or sooner if needed

## 2013-11-07 NOTE — Assessment & Plan Note (Signed)
He was educated today at length that he needs to quit smoking. He understands this. He has not had a good experience with Chantix in the past. He would like to continue to try the nicotine replacement.  He needs to have lung cancer screening.  Plan: -Lung cancer screening CT ordered -Call to [facility] quit line for free nicotine replacement

## 2013-11-07 NOTE — Progress Notes (Signed)
Subjective:    Patient ID: [name], male    DOB: 1950-11-26, 63 y.o.   MRN: [id number]  HPI  This is a very pleasant 63 year old male who still smokes cigarettes he comes to my clinic today for evaluation of COPD. He had a normal childhood without respiratory illnesses but unfortunately started smoking about a pack a day of cigarettes around age 42 or 78. He is continued to smoke the amount since then. At one point he quit for 1-1/2 years but then started back "because of other people around me". He has tried Chantix in the past and did not have a good experience with this.  He tells me that he is to go to the doctor 3-4 times a year for exacerbations of COPD. He says that whenever he gets an upper respiratory infection he will develop wheezing and increasing shortness of breath. Over the doctor and get prescribed an antibiotic and prednisone. Most recently, he was given a round of prednisone by his cardiologist Dr. Debara Pickett just 2 weeks ago.  He says that he gets shortness of breath with minimal activity such as gardening or working on his car. He feels chest tightness when this happens, he notes wheezing as well. He coughs up several tablespoons of clear sputum every morning for about 45 minutes after waking up.  He denies frank chest pain. He recently had a nuclear stress test of his heart which was normal. He does have a past medical history significant for coronary artery disease.  Past Medical History  Diagnosis Date  . Hypertension   . COPD (chronic obstructive pulmonary disease)   . Dyslipidemia   . Allergic rhinitis   . Asthma   . Anxiety and depression   . GERD (gastroesophageal reflux disease)   . IBS (irritable bowel syndrome)   . Mitral regurgitation   . Langerhan's cell histiocytosis   . Chronic low back pain   . Hearing loss   . Hyperplastic colon polyp 12/28/10  . Tubular adenoma 12/28/10  . Hemorrhoids   . OSA on CPAP   . CAD (coronary artery disease)     mild  to mod - LAD lesion in 2006  . History of nuclear stress test 08/2009    bruce protocol; negative for ischemia     Family History  Problem Relation Age of Onset  . Colon cancer Neg Hx   . Liver disease Neg Hx   . Inflammatory bowel disease Neg Hx   . Heart disease Father 71    etoh/breathing problmes  . GI problems Mother   . Hyperlipidemia Mother   . Heart attack Maternal Grandmother     also valvular problems  . Heart attack Maternal Grandfather   . Diabetes Paternal Grandmother   . Heart Problems Paternal Grandfather   . Heart attack Brother     stenting x3, also cancer  . Skin cancer Brother   . Hypertension Brother   . Hypertension Sister   . COPD Mother   . COPD Father   . COPD Paternal Grandmother      History   Social History  . Marital Status: Divorced    Spouse Name: N/A    Number of Children: 1  . Years of Education: N/A   Occupational History  . disability    Social History Main Topics  . Smoking status: Current Every Day Smoker -- 1.00 packs/day for 44 years    Types: Cigarettes  . Smokeless tobacco: Never Used  . Alcohol  Use: No  . Drug Use: No  . Sexual Activity: Not on file   Other Topics Concern  . Not on file   Social History Narrative   Son, age 50, hit by truck.     Allergies  Allergen Reactions  . Bee Venom Swelling     Outpatient Prescriptions Prior to Visit  Medication Sig Dispense Refill  . albuterol (PROVENTIL HFA;VENTOLIN HFA) 108 (90 BASE) MCG/ACT inhaler Inhale 2 puffs into the lungs every 6 (six) hours as needed for wheezing.      Marland Kitchen ALPRAZolam (XANAX) 1 MG tablet Take 1 mg by mouth at bedtime as needed.        Marland Kitchen aspirin 81 MG tablet Take 81 mg by mouth daily.        . fenofibrate (TRICOR) 145 MG tablet Take 145 mg by mouth daily.        . fish oil-omega-3 fatty acids 1000 MG capsule Take 2 g by mouth daily.        Marland Kitchen glucosamine-chondroitin 500-400 MG tablet Take 1 tablet by mouth 2 (two) times daily.      Marland Kitchen  HYDROcodone-acetaminophen (NORCO) 10-325 MG per tablet Take 1 tablet by mouth every 6 (six) hours as needed for pain.      . metoprolol tartrate (LOPRESSOR) 25 MG tablet Take 1 tablet (25 mg total) by mouth 2 (two) times daily.  180 tablet  1  . Multiple Vitamin (MULTIVITAMIN) capsule Take 1 capsule by mouth daily.        . nitroGLYCERIN (NITROSTAT) 0.4 MG SL tablet Place 1 tablet (0.4 mg total) under the tongue every 5 (five) minutes as needed for chest pain.  25 tablet  3  . omeprazole (PRILOSEC) 20 MG capsule Take 1 capsule (20 mg total) by mouth 2 (two) times daily.  60 capsule  0  . simvastatin (ZOCOR) 20 MG tablet Take 20 mg by mouth at bedtime.        . valsartan (DIOVAN) 160 MG tablet Take 80 mg by mouth daily.       . NON FORMULARY at bedtime. CPAP      . albuterol (PROVENTIL,VENTOLIN) 90 MCG/ACT inhaler Inhale 2 puffs into the lungs every 6 (six) hours as needed.        Marland Kitchen levofloxacin (LEVAQUIN) 500 MG tablet Take 1 tablet (500 mg total) by mouth daily.  7 tablet  0   No facility-administered medications prior to visit.      Review of Systems  Constitutional: Negative for fever and unexpected weight change.  HENT: Positive for congestion, postnasal drip and sinus pressure. Negative for dental problem, ear pain, nosebleeds, rhinorrhea, sneezing, sore throat and trouble swallowing.   Eyes: Negative for redness and itching.  Respiratory: Positive for cough, shortness of breath and wheezing. Negative for chest tightness.   Cardiovascular: Negative for palpitations and leg swelling.  Gastrointestinal: Negative for nausea and vomiting.  Genitourinary: Negative for dysuria.  Musculoskeletal: Negative for joint swelling.  Skin: Negative for rash.  Neurological: Negative for headaches.  Hematological: Does not bruise/bleed easily.  Psychiatric/Behavioral: Negative for dysphoric mood. The patient is not nervous/anxious.        Objective:   Physical Exam Filed Vitals:   11/07/13  1025  BP: 134/72  Pulse: 64  Height: 5\' 8"  (1.727 m)  Weight: 205 lb (92.987 kg)  SpO2: 96%   RA  Ambulated 500 feet and did not desaturate less than 88%  Gen: well appearing, no acute distress HEENT: NCAT, PERRL,  EOMi, OP clear, neck supple without masses PULM: few wheezes bilaterally CV: RRR, no mgr, no JVD AB: BS+, soft, nontender, no hsm Ext: warm, no edema, no clubbing, no cyanosis Derm: no rash or skin breakdown Neuro: A&Ox4, CN II-XII intact, strength 5/5 in all 4 extremities        Assessment & Plan:   COPD (chronic obstructive pulmonary disease) He has GOLD Grade C based on his frequent exacerbations.    He currently does not take his medications appropriately. It is unclear if he is getting the full benefit of Spiriva because he only uses it on as as-needed basis.   Combined recommendations from the [place], [employer], Investment banker, corporate, [place] (Qaseem A et al, Ann Intern Med. 2011;155(3):179) recommends tobacco cessation, pulmonary rehab (for symptomatic patients with an FEV1 < 50% predicted), supplemental oxygen (for patients with SaO2 <88% or paO2 <55), and appropriate bronchodilator therapy.  In regards to long acting bronchodilators, they recommend monotherapy (FEV1 60-80% with symptoms weak evidence, FEV1 with symptoms <60% strong evidence), or combination therapy (FEV1 <60% with symptoms, strong recommendation, moderate evidence).  One should also provide patients with annual immunizations and consider therapy for prevention of COPD exacerbations (ie. roflumilast or azithromycin) when appopriate.  -O2 therapy: Not indicated -Immunizations: Prevnar today, Pneumovax 2011, Flu in fall -Tobacco use: Educated at length to quit -Exercise: encouraged regular exercise -Bronchodilator therapy: Use Spiriva daily, if no improvement in one week, then trial Breo in addition to  Spiriva -Exacerbation prevention: Spiriva, Breo, quit smoking   Tobacco use disorder He was educated today at length that he needs to quit smoking. He understands this. He has not had a good experience with Chantix in the past. He would like to continue to try the nicotine replacement.  He needs to have lung cancer screening.  Plan: -Lung cancer screening CT ordered -Call to [facility] quit line for free nicotine replacement    Updated Medication List Outpatient Encounter Prescriptions as of 11/07/2013  Medication Sig  . albuterol (PROVENTIL HFA;VENTOLIN HFA) 108 (90 BASE) MCG/ACT inhaler Inhale 2 puffs into the lungs every 6 (six) hours as needed for wheezing.  Marland Kitchen ALPRAZolam (XANAX) 1 MG tablet Take 1 mg by mouth at bedtime as needed.    Marland Kitchen aspirin 81 MG tablet Take 81 mg by mouth daily.    . fenofibrate (TRICOR) 145 MG tablet Take 145 mg by mouth daily.    . fish oil-omega-3 fatty acids 1000 MG capsule Take 2 g by mouth daily.    Marland Kitchen glucosamine-chondroitin 500-400 MG tablet Take 1 tablet by mouth 2 (two) times daily.  Marland Kitchen HYDROcodone-acetaminophen (NORCO) 10-325 MG per tablet Take 1 tablet by mouth every 6 (six) hours as needed for pain.  . metoprolol tartrate (LOPRESSOR) 25 MG tablet Take 1 tablet (25 mg total) by mouth 2 (two) times daily.  . Multiple Vitamin (MULTIVITAMIN) capsule Take 1 capsule by mouth daily.    . nitroGLYCERIN (NITROSTAT) 0.4 MG SL tablet Place 1 tablet (0.4 mg total) under the tongue every 5 (five) minutes as needed for chest pain.  Marland Kitchen omeprazole (PRILOSEC) 20 MG capsule Take 1 capsule (20 mg total) by mouth 2 (two) times daily.  . simvastatin (ZOCOR) 20 MG tablet Take 20 mg by mouth at bedtime.    Marland Kitchen tiotropium (SPIRIVA) 18 MCG inhalation capsule Place 18 mcg into inhaler and inhale daily.  . valsartan (DIOVAN) 160 MG tablet Take 80 mg by mouth daily.   Marland Kitchen  NON FORMULARY at bedtime. CPAP  . [DISCONTINUED] albuterol (PROVENTIL,VENTOLIN) 90 MCG/ACT inhaler Inhale 2  puffs into the lungs every 6 (six) hours as needed.    . [DISCONTINUED] levofloxacin (LEVAQUIN) 500 MG tablet Take 1 tablet (500 mg total) by mouth daily.

## 2013-11-08 LAB — IGE: IgE (Immunoglobulin E), Serum: 42.6 IU/mL (ref 0.0–180.0)

## 2013-11-13 LAB — ALPHA-1 ANTITRYPSIN PHENOTYPE: A1 ANTITRYPSIN: 166 mg/dL (ref 83–199)

## 2013-11-16 LAB — PULMONARY FUNCTION TEST
DL/VA % pred: 84 %
DL/VA: 3.81 ml/min/mmHg/L
DLCO UNC % PRED: 92 %
DLCO UNC: 27.29 ml/min/mmHg
FEF 25-75 POST: 2.19 L/s
FEF 25-75 PRE: 1.48 L/s
FEF2575-%Change-Post: 48 %
FEF2575-%PRED-POST: 79 %
FEF2575-%Pred-Pre: 53 %
FEV1-%Change-Post: 9 %
FEV1-%Pred-Post: 89 %
FEV1-%Pred-Pre: 82 %
FEV1-PRE: 2.75 L
FEV1-Post: 3 L
FEV1FVC-%Change-Post: -5 %
FEV1FVC-%PRED-PRE: 88 %
FEV6-%CHANGE-POST: 15 %
FEV6-%PRED-POST: 110 %
FEV6-%PRED-PRE: 95 %
FEV6-POST: 4.63 L
FEV6-Pre: 4.03 L
FEV6FVC-%CHANGE-POST: 0 %
FEV6FVC-%Pred-Post: 103 %
FEV6FVC-%Pred-Pre: 104 %
FVC-%CHANGE-POST: 15 %
FVC-%PRED-POST: 106 %
FVC-%PRED-PRE: 92 %
FVC-POST: 4.69 L
FVC-PRE: 4.07 L
PRE FEV1/FVC RATIO: 68 %
Post FEV1/FVC ratio: 64 %
Post FEV6/FVC ratio: 99 %
Pre FEV6/FVC Ratio: 99 %
RV % PRED: 134 %
RV: 2.89 L
TLC % pred: 111 %
TLC: 7.37 L

## 2013-11-18 ENCOUNTER — Encounter: Payer: Self-pay | Admitting: Pulmonary Disease

## 2013-11-27 ENCOUNTER — Encounter: Payer: Self-pay | Admitting: Pulmonary Disease

## 2013-11-27 ENCOUNTER — Ambulatory Visit (INDEPENDENT_AMBULATORY_CARE_PROVIDER_SITE_OTHER)
Admission: RE | Admit: 2013-11-27 | Discharge: 2013-11-27 | Disposition: A | Discharge status: Home / Self Care | Payer: Self-pay | Source: Ambulatory Visit | Attending: Pulmonary Disease | Admitting: Pulmonary Disease

## 2013-11-27 DIAGNOSIS — J441 Chronic obstructive pulmonary disease with (acute) exacerbation: Secondary | ICD-10-CM

## 2014-01-09 ENCOUNTER — Other Ambulatory Visit: Payer: Self-pay | Admitting: *Deleted

## 2014-01-09 DIAGNOSIS — G4733 Obstructive sleep apnea (adult) (pediatric): Secondary | ICD-10-CM

## 2014-02-24 ENCOUNTER — Ambulatory Visit: Payer: Medicare Other | Admitting: Pulmonary Disease

## 2014-03-10 ENCOUNTER — Encounter (HOSPITAL_BASED_OUTPATIENT_CLINIC_OR_DEPARTMENT_OTHER): Payer: Medicare Other

## 2014-04-22 ENCOUNTER — Ambulatory Visit (INDEPENDENT_AMBULATORY_CARE_PROVIDER_SITE_OTHER): Payer: Medicare Other | Admitting: Internal Medicine

## 2014-04-22 ENCOUNTER — Encounter: Payer: Self-pay | Admitting: Internal Medicine

## 2014-04-22 VITALS — BP 120/80 | HR 61 | Ht 69.0 in | Wt 196.9 lb

## 2014-04-22 DIAGNOSIS — I10 Essential (primary) hypertension: Secondary | ICD-10-CM

## 2014-04-22 DIAGNOSIS — F17200 Nicotine dependence, unspecified, uncomplicated: Secondary | ICD-10-CM

## 2014-04-22 DIAGNOSIS — G4733 Obstructive sleep apnea (adult) (pediatric): Secondary | ICD-10-CM

## 2014-04-22 DIAGNOSIS — I351 Nonrheumatic aortic (valve) insufficiency: Secondary | ICD-10-CM

## 2014-04-22 DIAGNOSIS — Z720 Tobacco use: Secondary | ICD-10-CM

## 2014-04-22 MED ORDER — METOPROLOL TARTRATE 25 MG PO TABS: 25.0000 mg | ORAL_TABLET | Freq: Two times a day (BID) | ORAL | Status: DC

## 2014-04-22 NOTE — Progress Notes (Signed)
OFFICE NOTE  Chief Complaint:  New onset chest pain  Primary Care Physician: Robert Herring., MD  HPI:  [name]  is a 63 year old gentleman with a history of hypertension, depression, COPD, sleep apnea, GERD and dyslipidemia. He also has mild to moderate coronary disease with a 40% proximal to mid-LAD lesion in 2006. He has some mild aortic insufficiency on echocardiogram, and that was repeated in October of 2012 and showed a preserved LVEF of greater than 55%, normal chamber size and function, mild mitral and aortic insufficiency, and mild to moderate tricuspid insufficiency. There was also mild pulmonary arterial hypertension, RVSP of 34 mmHg which is actually borderline. He recently underwent right knee surgery and has been recovering from this. He did seem to tolerate the surgery well and it was under general anesthesia. However since surgery he has noted some increasing chest tightness and pain. The symptoms are mostly substernal and radiates to the left chest and occasionally to the left arm. It seemed to occur more with exertion but can occur at rest. He does not note any change when using his inhaler. He does report occasional wheezing still related to his COPD, which is worse at night and does improve with his inhaler. His chest pain is increasing with intensity and seems to be coming more frequently.  Unfortunately continues to smoke and is not yet ready to quit because he is around too many people that smoke.  He is referred for a nuclear stress test which are performed on 03/06/2013. This study was negative for ischemia. He continues to report some shortness of breath with exertion but not as significant. He has had a number of episodes of shortness of breath which I believe are exacerbations of chronic bronchitis and COPD. He only uses her rescue inhaler but fairly frequently. He continues to smoke but has cut back some. I referred him to see Dr. Lake Bells who diagnosed him  with COPD stage [name], he has been started on Spiriva with an improvement in his shortness of breath. He also has a albuterol inhaler to use as needed. Generally he feels is doing well.  PMHx:  Past Medical History  Diagnosis Date  . Hypertension   . COPD (chronic obstructive pulmonary disease)   . Dyslipidemia   . Allergic rhinitis   . Asthma   . Anxiety and depression   . GERD (gastroesophageal reflux disease)   . IBS (irritable bowel syndrome)   . Mitral regurgitation   . Langerhan's cell histiocytosis   . Chronic low back pain   . Hearing loss   . Hyperplastic colon polyp 12/28/10  . Tubular adenoma 12/28/10  . Hemorrhoids   . OSA on CPAP   . CAD (coronary artery disease)     mild to mod - LAD lesion in 2006  . History of nuclear stress test 08/2009    bruce protocol; negative for ischemia    Past Surgical History  Procedure Laterality Date  . Inguinal hernia repair      left  . Ear pinna reconstruction w/ rib graft      right  . Cholecystectomy    . Tonsillectomy    . Foot surgery      right  . Lung biopsy  2009    right  . Colonoscopy  2007    friable anal canal, hyperplastic polyp  . Esophagogastroduodenoscopy      multiple dilations, last EGD 2007 showed small hh, adenomatous appearing gastric mucosa in body but  biopsies benign. SB bx negative for Celiac.  Marland Kitchen Esophagogastroduodenoscopy  12/28/2010    Dr. Gala Romney- normal esophagus s/p dilationpatchy erythema and erosions.  Venia Minks dilation  12/28/2010    Procedure: Venia Minks DILATION;  Surgeon: Daneil Dolin, MD;  Location: [facility];  Service: Endoscopy;  Laterality: N/A;  . Colonoscopy  12/28/2010    Dr. Gala Romney- tubular adenoma, hyperplastic polyp  . Transthoracic echocardiogram  02/2011    EF =>55%, normal chamber size & function; mild mitral and aortic insuff, mild-mod tricuspid insuff; mild pulm htn with rsvp of 4mmHg    FAMHx:  Family History  Problem Relation Age of Onset  . Colon cancer Neg Hx   . Liver  disease Neg Hx   . Inflammatory bowel disease Neg Hx   . Heart disease Father 82    etoh/breathing problmes  . GI problems Mother   . Hyperlipidemia Mother   . Heart attack Maternal Grandmother     also valvular problems  . Heart attack Maternal Grandfather   . Diabetes Paternal Grandmother   . Heart Problems Paternal Grandfather   . Heart attack Brother     stenting x3, also cancer  . Skin cancer Brother   . Hypertension Brother   . Hypertension Sister   . COPD Mother   . COPD Father   . COPD Paternal Grandmother     SOCHx:   reports that he has been smoking Cigarettes.  He has a 22 pack-year smoking history. He has never used smokeless tobacco. He reports that he does not drink alcohol or use illicit drugs.  ALLERGIES:  Allergies  Allergen Reactions  . Bee Venom Swelling    ROS: A comprehensive review of systems was negative except for: Respiratory: positive for chronic bronchitis, dyspnea on exertion and wheezing  HOME MEDS: Current Outpatient Prescriptions  Medication Sig Dispense Refill  . albuterol (PROVENTIL HFA;VENTOLIN HFA) 108 (90 BASE) MCG/ACT inhaler Inhale 2 puffs into the lungs every 6 (six) hours as needed for wheezing.    Marland Kitchen ALPRAZolam (XANAX) 1 MG tablet Take 1 mg by mouth at bedtime as needed.      Marland Kitchen aspirin 81 MG tablet Take 81 mg by mouth daily.      . fenofibrate (TRICOR) 145 MG tablet Take 145 mg by mouth daily.      . fish oil-omega-3 fatty acids 1000 MG capsule Take 2 g by mouth daily.      Marland Kitchen glucosamine-chondroitin 500-400 MG tablet Take 1 tablet by mouth 2 (two) times daily.    Marland Kitchen HYDROcodone-acetaminophen (NORCO) 10-325 MG per tablet Take 1 tablet by mouth every 6 (six) hours as needed for pain.    . metoprolol tartrate (LOPRESSOR) 25 MG tablet Take 1 tablet (25 mg total) by mouth 2 (two) times daily. 180 tablet 3  . Multiple Vitamin (MULTIVITAMIN) capsule Take 1 capsule by mouth daily.      . nitroGLYCERIN (NITROSTAT) 0.4 MG SL tablet Place 1  tablet (0.4 mg total) under the tongue every 5 (five) minutes as needed for chest pain. 25 tablet 3  . NON FORMULARY at bedtime. CPAP    . simvastatin (ZOCOR) 20 MG tablet Take 20 mg by mouth at bedtime.      Marland Kitchen tiotropium (SPIRIVA) 18 MCG inhalation capsule Place 1 capsule (18 mcg total) into inhaler and inhale daily. 30 capsule 5  . valsartan (DIOVAN) 160 MG tablet Take 80 mg by mouth daily.     Marland Kitchen omeprazole (PRILOSEC) 20 MG capsule Take  1 capsule (20 mg total) by mouth 2 (two) times daily. 60 capsule 0   No current facility-administered medications for this visit.    LABS/IMAGING: No results found for this or any previous visit (from the past 48 hour(s)). No results found.  VITALS: BP 120/80 mmHg  Pulse 61  Ht 5\' 9"  (1.753 m)  Wt 196 lb 14.4 oz (89.313 kg)  BMI 29.06 kg/m2  EXAM: General appearance: alert and no distress Neck: no adenopathy, no carotid bruit, no JVD, supple, symmetrical, trachea midline and thyroid not enlarged, symmetric, no tenderness/mass/nodules Lungs: poor airflow, consolidation in the left base with E/A changes, upper airway end-expiratory wheezes, rhonchi Heart: regular rate and rhythm, S1, S2 normal, systolic murmur: early systolic 2/6, crescendo at apex and diastolic murmur: mid diastolic 2/6, blowing at 2nd right intercostal space Abdomen: soft, non-tender; bowel sounds normal; no masses,  no organomegaly Extremities: extremities normal, atraumatic, no cyanosis or edema Pulses: 2+ and symmetric Skin: Skin color, texture, turgor normal. No rashes or lesions Neurologic: Grossly normal Psych: Does not appear anxious  EKG: Normal sinus rhythm at 61  ASSESSMENT: 1. Exertional chest pain-negative nuclear stress test 2. Known coronary artery disease with a 40% LAD stenosis in 2006 3. Ongoing tobacco abuse 4. COPD - probably acute exacerbation, possibly pneumonia. 5. Mild aortic and mitral insufficiency - no change in murmur intensity 6. Dyslipidemia -  recently checked by PCP and at goal 7. Hypertension - at goal on current hypertensives, no changes 8. Obstructive sleep apnea on CPAP  PLAN: 1.   Mr. Ospina has reported no further chest pressure. Some of his symptoms may been related to COPD. He feels that his breathing has improved on Spiriva. Blood pressure remains controlled. Overall he is doing well. Unfortunately he did not make an appointment to have a repeat sleep study for new CPAP equipment. He is currently not using CPAP. I think the important for him to make that appointment so that we can determine whether he has a persistent sleep apnea or whether it has improved. If he continues to have sleep apnea, he will need to be fitted for a new mask and provided with new equipment.  Plan to see him back in 6 months.  Robert Casino, MD, [facility] Attending Cardiologist [facility]  [name] 04/22/2014, 10:23 AM

## 2014-04-22 NOTE — Patient Instructions (Signed)
Your physician wants you to follow-up in: 1 year with Dr. Hilty. You will receive a reminder letter in the mail two months in advance. If you don't receive a letter, please call our office to schedule the follow-up appointment.  

## 2014-11-12 ENCOUNTER — Encounter: Payer: Self-pay | Admitting: Internal Medicine

## 2014-11-13 ENCOUNTER — Ambulatory Visit (HOSPITAL_COMMUNITY): Admission: RE | Admit: 2014-11-13 | Payer: Medicare Other | Source: Ambulatory Visit

## 2014-11-13 ENCOUNTER — Other Ambulatory Visit (HOSPITAL_COMMUNITY): Payer: Self-pay | Admitting: Family Medicine

## 2014-11-13 ENCOUNTER — Ambulatory Visit (HOSPITAL_COMMUNITY)
Admission: RE | Admit: 2014-11-13 | Discharge: 2014-11-13 | Disposition: A | Discharge status: Home / Self Care | Payer: Medicare Other | Source: Ambulatory Visit | Attending: Family Medicine | Admitting: Family Medicine

## 2014-11-13 DIAGNOSIS — R0602 Shortness of breath: Secondary | ICD-10-CM | POA: Insufficient documentation

## 2014-11-13 DIAGNOSIS — J449 Chronic obstructive pulmonary disease, unspecified: Secondary | ICD-10-CM

## 2014-11-13 DIAGNOSIS — R05 Cough: Secondary | ICD-10-CM | POA: Diagnosis not present

## 2014-11-13 DIAGNOSIS — Z720 Tobacco use: Secondary | ICD-10-CM | POA: Insufficient documentation

## 2014-11-13 DIAGNOSIS — R0989 Other specified symptoms and signs involving the circulatory and respiratory systems: Secondary | ICD-10-CM | POA: Insufficient documentation

## 2014-12-30 ENCOUNTER — Ambulatory Visit (INDEPENDENT_AMBULATORY_CARE_PROVIDER_SITE_OTHER): Payer: Medicare Other | Admitting: Internal Medicine

## 2014-12-30 ENCOUNTER — Other Ambulatory Visit: Payer: Self-pay

## 2014-12-30 ENCOUNTER — Encounter: Payer: Self-pay | Admitting: Internal Medicine

## 2014-12-30 VITALS — BP 130/84 | HR 69 | Temp 96.8°F | Ht 68.0 in | Wt 197.6 lb

## 2014-12-30 DIAGNOSIS — K921 Melena: Secondary | ICD-10-CM

## 2014-12-30 DIAGNOSIS — R195 Other fecal abnormalities: Secondary | ICD-10-CM | POA: Diagnosis not present

## 2014-12-30 DIAGNOSIS — K219 Gastro-esophageal reflux disease without esophagitis: Secondary | ICD-10-CM | POA: Diagnosis not present

## 2014-12-30 MED ORDER — PEG 3350-KCL-NA BICARB-NACL 420 G PO SOLR: 4000.0000 mL | Freq: Once | ORAL | Status: DC

## 2014-12-30 NOTE — Patient Instructions (Addendum)
Schedule diagnostic colonoscopy (hematochezia and hemocult positive stool)  Phenergan 12.5 mg IV prior to colonoscopy  Split movie prep  Continue Omeparazole daily (benefits outweigh the risks as discussed)  Further recommendations to follow

## 2014-12-30 NOTE — Progress Notes (Signed)
**Note Robert-Identified via Obfuscation** Primary Care Physician:  [name]., MD Primary Gastroenterologist:  Dr. Gala [name]  Pre-Procedure History & Physical: HPI:  [name] is a 64 y.o. male here for followup of GERD. Doing well on omeprazole 20 mg daily. No dysphagia. Schatzki's ring previously dilated. Intermittent episodes of painless hematochezia. Sometimes covers the entire toilet water red. Denies straining. He denies constipation. Does take hydrocodone regularly. Adenoma removed his colon in 2012 the setting of a suboptimal prep. Patient due for repeat colonoscopy next year. No significant intercurrent medical problems. No dysphagia, nausea, vomiting or early satiety. Denies bowel pain. Patient wants his prostate checked.  Past Medical History  Diagnosis Date  . Hypertension   . COPD (chronic obstructive pulmonary disease)   . Dyslipidemia   . Allergic rhinitis   . Asthma   . Anxiety and depression   . GERD (gastroesophageal reflux disease)   . IBS (irritable bowel syndrome)   . Mitral regurgitation   . Langerhan's cell histiocytosis   . Chronic low back pain   . Hearing loss   . Hyperplastic colon polyp 12/28/10  . Tubular adenoma 12/28/10  . Hemorrhoids   . OSA on CPAP   . CAD (coronary artery disease)     mild to mod - LAD lesion in 2006  . History of nuclear stress test 08/2009    bruce protocol; negative for ischemia    Past Surgical History  Procedure Laterality Date  . Inguinal hernia repair      left  . Ear pinna reconstruction w/ rib graft      right  . Cholecystectomy    . Tonsillectomy    . Foot surgery      right  . Lung biopsy  2009    right  . Colonoscopy  2007    friable anal canal, hyperplastic polyp  . Esophagogastroduodenoscopy      multiple dilations, last EGD 2007 showed small hh, adenomatous appearing gastric mucosa in body but biopsies benign. SB bx negative for Celiac.  Marland Kitchen Esophagogastroduodenoscopy  12/28/2010    Dr. Gala [name]- normal esophagus s/p dilationpatchy erythema  and erosions.  [name] dilation  12/28/2010    Procedure: [name] DILATION;  Surgeon: Robert Dolin, MD;  Location: [facility];  Service: Endoscopy;  Laterality: N/A;  . Colonoscopy  12/28/2010    Dr. Gala [name]- tubular adenoma, hyperplastic polyp  . Transthoracic echocardiogram  02/2011    EF =>55%, normal chamber size & function; mild mitral and aortic insuff, mild-mod tricuspid insuff; mild pulm htn with rsvp of 54mmHg    Prior to Admission medications   Medication Sig Start Date End Date Taking? Authorizing Provider  albuterol (PROVENTIL HFA;VENTOLIN HFA) 108 (90 BASE) MCG/ACT inhaler Inhale 2 puffs into the lungs every 6 (six) hours as needed for wheezing.   Yes Historical Provider, MD  ALPRAZolam Duanne Moron) 1 MG tablet Take 1 mg by mouth at bedtime as needed.     Yes Historical Provider, MD  aspirin 81 MG tablet Take 81 mg by mouth daily.     Yes Historical Provider, MD  fenofibrate (TRICOR) 145 MG tablet Take 145 mg by mouth daily.     Yes Historical Provider, MD  fish oil-omega-3 fatty acids 1000 MG capsule Take 2 g by mouth daily.     Yes Historical Provider, MD  glucosamine-chondroitin 500-400 MG tablet Take 1 tablet by mouth 2 (two) times daily.   Yes Historical Provider, MD  HYDROcodone-acetaminophen (NORCO) 10-325 MG per tablet Take 1 tablet by  mouth every 6 (six) hours as needed for pain.   Yes Historical Provider, MD  metoprolol tartrate (LOPRESSOR) 25 MG tablet Take 1 tablet (25 mg total) by mouth 2 (two) times daily. 04/22/14  Yes Pixie Casino, MD  Multiple Vitamin (MULTIVITAMIN) capsule Take 1 capsule by mouth daily.     Yes Historical Provider, MD  simvastatin (ZOCOR) 20 MG tablet Take 20 mg by mouth at bedtime.     Yes Historical Provider, MD  tiotropium (SPIRIVA) 18 MCG inhalation capsule Place 1 capsule (18 mcg total) into inhaler and inhale daily. 11/07/13  Yes Juanito Doom, MD  valsartan (DIOVAN) 160 MG tablet Take 80 mg by mouth daily.    Yes Historical Provider, MD    nitroGLYCERIN (NITROSTAT) 0.4 MG SL tablet Place 1 tablet (0.4 mg total) under the tongue every 5 (five) minutes as needed for chest pain. Patient not taking: Reported on 12/30/2014 03/21/13   Pixie Casino, MD  NON FORMULARY at bedtime. CPAP    Historical Provider, MD  omeprazole (PRILOSEC) 20 MG capsule Take 1 capsule (20 mg total) by mouth 2 (two) times daily. 11/26/12 11/26/13  Orvil Feil, NP    Allergies as of 12/30/2014 - Review Complete 12/30/2014  Allergen Reaction Noted  . Bee venom Swelling 12/28/2010    Family History  Problem Relation Age of Onset  . Colon cancer Neg Hx   . Liver disease Neg Hx   . Inflammatory bowel disease Neg Hx   . Heart disease Father 101    etoh/breathing problmes  . GI problems Mother   . Hyperlipidemia Mother   . Heart attack Maternal Grandmother     also valvular problems  . Heart attack Maternal Grandfather   . Diabetes Paternal Grandmother   . Heart Problems Paternal Grandfather   . Heart attack Brother     stenting x3, also cancer  . Skin cancer Brother   . Hypertension Brother   . Hypertension Sister   . COPD Mother   . COPD Father   . COPD Paternal Grandmother     History   Social History  . Marital Status: Married    Spouse Name: N/A  . Number of Children: 1  . Years of Education: N/A   Occupational History  . disability    Social History Main Topics  . Smoking status: Current Every Day Smoker -- 0.50 packs/day for 44 years    Types: Cigarettes  . Smokeless tobacco: Never Used     Comment: 1/2 pack daily  . Alcohol Use: No  . Drug Use: No  . Sexual Activity: Not on file   Other Topics Concern  . Not on file   Social History Narrative   Son, age 48, hit by truck.    Review of Systems: See HPI, otherwise negative ROS  Physical Exam: BP 130/84 mmHg  Pulse 69  Temp(Src) 96.8 F (36 C) (Oral)  Ht 5\' 8"  (1.727 m)  Wt 197 lb 9.6 oz (89.631 kg)  BMI 30.05 kg/m2 General:   Alert,  Well-developed, well-nourished,  pleasant and cooperative in NAD Skin:  Intact without significant lesions or rashes. Eyes:  Sclera clear, no icterus.   Conjunctiva pink. Ears:  Normal auditory acuity. Nose:  No deformity, discharge,  or lesions. Mouth:  No deformity or lesions. Neck:  Supple; no masses or thyromegaly. No significant cervical adenopathy. Lungs:  Clear throughout to auscultation.   No wheezes, crackles, or rhonchi. No acute distress. Heart:  Regular rate  and rhythm; no murmurs, clicks, rubs,  or gallops. Abdomen: Non-distended, normal bowel sounds.  Soft and nontender without appreciable mass or hepatosplenomegaly.  Pulses:  Normal pulses noted. Extremities:  Without clubbing or edema. Rectal:   No external lesions. Good sphincter tone scant brown formed stool in rectal vault. Prostate nontender.  no nodularity  Stool is trace Hemoccult positive   Impression:   Pleasant 64 year old gentleman with well-controlled GERD. On omeprazole 20 mg daily.  Discussed the long-term risks/benefits of chronic acid suppression therapy. In the case of Mr. Brock Bad, I feel the benefits far outweigh the risk. He notes intermittent hematochezia. Hemoccult-positive today. Likely benign and a rectal source of bleeding however, we need to go ahead and do a colonoscopy now. Prostate palpated to be normal on today's digital rectal exam. However, I told patient that is no substitute for regular health maintenance and seeing his primary care physician, Dr. Gerarda Fraction, regarding health maintenance issues.  Recommendations:   Schedule diagnostic colonoscopy (hematochezia and hemocult positive stool)  Phenergan 12.5 mg IV prior to colonoscopy  Split movie prep  Continue Omeparazole daily (benefits outweigh the risks as discussed)  Smoking cessation strongly recommended.  Further recommendations to follow      Notice: This dictation was prepared with Dragon dictation along with smaller phrase technology. Any transcriptional errors that  result from this process are unintentional and may not be corrected upon review.

## 2015-01-14 ENCOUNTER — Ambulatory Visit (INDEPENDENT_AMBULATORY_CARE_PROVIDER_SITE_OTHER): Payer: Medicare Other | Admitting: Neurology

## 2015-01-14 ENCOUNTER — Encounter: Payer: Self-pay | Admitting: Neurology

## 2015-01-14 VITALS — BP 113/78 | HR 74 | Resp 18 | Ht 68.0 in | Wt 197.0 lb

## 2015-01-14 DIAGNOSIS — G4733 Obstructive sleep apnea (adult) (pediatric): Secondary | ICD-10-CM | POA: Diagnosis not present

## 2015-01-14 DIAGNOSIS — Z79899 Other long term (current) drug therapy: Secondary | ICD-10-CM

## 2015-01-14 DIAGNOSIS — M5442 Lumbago with sciatica, left side: Secondary | ICD-10-CM

## 2015-01-14 DIAGNOSIS — G4761 Periodic limb movement disorder: Secondary | ICD-10-CM

## 2015-01-14 DIAGNOSIS — G2581 Restless legs syndrome: Secondary | ICD-10-CM

## 2015-01-14 DIAGNOSIS — R519 Headache, unspecified: Secondary | ICD-10-CM

## 2015-01-14 DIAGNOSIS — E663 Overweight: Secondary | ICD-10-CM | POA: Diagnosis not present

## 2015-01-14 DIAGNOSIS — Z720 Tobacco use: Secondary | ICD-10-CM | POA: Diagnosis not present

## 2015-01-14 DIAGNOSIS — Z79891 Long term (current) use of opiate analgesic: Secondary | ICD-10-CM

## 2015-01-14 DIAGNOSIS — F17200 Nicotine dependence, unspecified, uncomplicated: Secondary | ICD-10-CM

## 2015-01-14 DIAGNOSIS — R51 Headache: Secondary | ICD-10-CM

## 2015-01-14 NOTE — Progress Notes (Signed)
Subjective:    [name] ID: Robert [name] is a 64 y.o. male.  HPI     Robert Age, MD, PhD [facility] [address], [address] [place], [place] [address]  Dear Dr. Gerarda Fraction,   I saw your [name], Robert [name], upon your kind request in my neurologic clinic today for initial consultation of his sleep disorder, in particular his obstructive sleep apnea. Robert [name] is unaccompanied today. As you know, Robert [name] is a 65 year old right-handed gentleman with an underlying complicated medical history of COPD, smoking, hyperlipidemia, allergies, anxiety, asthma, depression, reflux disease, IBS, mitral valve regurgitation, Langerhans' cell histiocytosis, chronic low back pain (hydrocodone 10/325 mg, up to 1 qid, but he take 1/2 pill bid on average), hearing loss, coronary artery disease, and obesity, who was previously diagnosed with obstructive sleep apnea and placed on CPAP therapy. About 8 to 12 months ago, his machine broke. Prior sleep test results are not available for my review today. He was diagnosed with sleep apnea about 12 years ago. His younger half brothers have OSA.  He feels tired during [name]. He snores according to his wife and also has pauses in his breathing while asleep. He is trying to quit smoking and currently is smoking half a pack per [name]. She drinks coffee 2-3 cups in Robert [name] and no sodas. He does not drink alcohol. He reports that his father was alcoholic and died young. His Epworth sleepiness score is 1 out of 24 today, his fatigue scores 44 out of 63. He has frequent morning headaches, about 3-4 times per week. He denies nocturia, he has restless leg symptoms and reports leg twitching at night and his sleep study apparently also showed leg twitching he recalls. He has midline low back pain, radiating to Robert [name]. He never had back surgery. He tries to use his hydrocodone sparingly. He is retired. He has had nodules on his lung. He saw  Dr. Lake Bells in pulmonology about a year ago and is supposed to go back this year.  His Past Medical History Is Significant For: Past Medical History  Diagnosis Date  . Hypertension   . COPD (chronic obstructive pulmonary disease)   . Dyslipidemia   . Allergic rhinitis   . Asthma   . Anxiety and depression   . GERD (gastroesophageal reflux disease)   . IBS (irritable bowel syndrome)   . Mitral regurgitation   . Langerhan's cell histiocytosis   . Chronic low back pain   . Hearing loss   . Hyperplastic colon polyp 12/28/10  . Tubular adenoma 12/28/10  . Hemorrhoids   . OSA on CPAP   . CAD (coronary artery disease)     mild to mod - LAD lesion in 2006  . History of nuclear stress test 08/2009    bruce protocol; negative for ischemia  . Easy fatigability     His Past Surgical History Is Significant For: Past Surgical History  Procedure Laterality Date  . Inguinal hernia repair      [name]  . Ear pinna reconstruction w/ rib graft      right  . Cholecystectomy    . Tonsillectomy    . Foot surgery      right  . Lung biopsy  2009    right  . Colonoscopy  2007    friable anal canal, hyperplastic polyp  . Esophagogastroduodenoscopy      multiple dilations, last EGD 2007 showed small hh, adenomatous appearing gastric mucosa in body  but biopsies benign. SB bx negative for Celiac.  Marland Kitchen Esophagogastroduodenoscopy  12/28/2010    Dr. Gala Romney- normal esophagus s/p dilationpatchy erythema and erosions.  Venia Minks dilation  12/28/2010    Procedure: Venia Minks DILATION;  Surgeon: Daneil Dolin, MD;  Location: [facility];  Service: Endoscopy;  Laterality: N/A;  . Colonoscopy  12/28/2010    Dr. Gala Romney- tubular adenoma, hyperplastic polyp  . Transthoracic echocardiogram  02/2011    EF =>55%, normal chamber size & function; mild mitral and aortic insuff, mild-mod tricuspid insuff; mild pulm htn with rsvp of 27mmHg    His Family History Is Significant For: Family History  Problem Relation [name] of Onset   . Colon cancer Neg Hx   . Liver disease Neg Hx   . Inflammatory bowel disease Neg Hx   . Heart disease Father 93    etoh/breathing problmes  . GI problems Mother   . Hyperlipidemia Mother   . Heart attack Maternal Grandmother     also valvular problems  . Heart attack Maternal Grandfather   . Diabetes Paternal Grandmother   . Heart Problems Paternal Grandfather   . Heart attack Brother     stenting x3, also cancer  . Skin cancer Brother   . Hypertension Brother   . Hypertension Sister   . COPD Mother   . COPD Father   . COPD Paternal Grandmother     His Social History Is Significant For: Social History   Social History  . Marital Status: Married    Spouse Name: N/A  . Number of Children: 1  . Years of Education: N/A   Occupational History  . disability    Social History Main Topics  . Smoking status: Current Every [name] Smoker -- 0.50 packs/[name] for 44 years    Types: Cigarettes  . Smokeless tobacco: Never Used     Comment: 1/2 pack daily  . Alcohol Use: No  . Drug Use: No  . Sexual Activity: Not Asked   Other Topics Concern  . None   Social History Narrative   Son, [name] 4, hit by truck.   Drinks about 2-3 cups of coffee a [name]. Occasionally drinks tea.     His Allergies Are:  Allergies  Allergen Reactions  . Bee Venom Swelling  :   His Current Medications Are:  Outpatient Encounter Prescriptions as of 01/14/2015  Medication Sig  . albuterol (PROVENTIL HFA;VENTOLIN HFA) 108 (90 BASE) MCG/ACT inhaler Inhale 2 puffs into Robert lungs every 6 (six) hours as needed for wheezing.  Marland Kitchen ALPRAZolam (XANAX) 1 MG tablet Take 1 mg by mouth at bedtime as needed.    Marland Kitchen aspirin 81 MG tablet Take 81 mg by mouth daily.    . fenofibrate (TRICOR) 145 MG tablet Take 145 mg by mouth daily.    . fish oil-omega-3 fatty acids 1000 MG capsule Take 2 g by mouth daily.    Marland Kitchen glucosamine-chondroitin 500-400 MG tablet Take 1 tablet by mouth 2 (two) times daily.  Marland Kitchen  HYDROcodone-acetaminophen (NORCO) 10-325 MG per tablet Take 1 tablet by mouth every 6 (six) hours as needed for pain.  . metoprolol tartrate (LOPRESSOR) 25 MG tablet Take 1 tablet (25 mg total) by mouth 2 (two) times daily.  . Multiple Vitamin (MULTIVITAMIN) capsule Take 1 capsule by mouth daily.    . nitroGLYCERIN (NITROSTAT) 0.4 MG SL tablet Place 1 tablet (0.4 mg total) under Robert tongue every 5 (five) minutes as needed for chest pain.  . polyethylene glycol-electrolytes (NULYTELY/GOLYTELY)  420 G solution Take 4,000 mLs by mouth once.  . simvastatin (ZOCOR) 20 MG tablet Take 20 mg by mouth at bedtime.    Marland Kitchen tiotropium (SPIRIVA) 18 MCG inhalation capsule Place 1 capsule (18 mcg total) into inhaler and inhale daily.  . valsartan (DIOVAN) 160 MG tablet Take 80 mg by mouth daily.   Marland Kitchen omeprazole (PRILOSEC) 20 MG capsule Take 1 capsule (20 mg total) by mouth 2 (two) times daily.   No facility-administered encounter medications on file as of 01/14/2015.  :  Review of Systems:  Out of a complete 14 point review of systems, all are reviewed and negative with Robert exception of these symptoms as listed below:   Review of Systems  Constitutional: Positive for fatigue.  HENT: Positive for hearing loss.   Eyes:       Blurred vision   Respiratory: Positive for cough, shortness of breath and wheezing.        Snoring   Cardiovascular: Positive for chest pain.       Murmur  Gastrointestinal:       Blood in stool   Musculoskeletal:       Joint pain, aching muscles   Neurological: Positive for dizziness, weakness, numbness and headaches.       Last sleep study was about 12 years ago. Last year [name] accidentally dropped Robert CPAP machine in Robert tub. He has been off of it for 1 year. Snoring, no trouble falling or staying asleep, witnessed apnea, wakes up in Robert morning feeling tired, denies taking naps.   Hematological: Bruises/bleeds easily.  Psychiatric/Behavioral: Positive for confusion.        Depression, anxiety, decreased energy, suicidal thoughts, racing thoughts    Objective:  Neurologic Exam  Physical Exam Physical Examination:   Filed Vitals:   01/14/15 1426  BP: 113/78  Pulse: 74  Resp:     General Examination: Robert [name] is a very pleasant 64 y.o. male in no acute distress. He appears well-developed and well-nourished and adequately groomed.   HEENT: Normocephalic, atraumatic, pupils are equal, round and reactive to light and accommodation. Funduscopic exam is normal with sharp disc margins noted. Extraocular tracking is good without limitation to gaze excursion or nystagmus noted. Normal smooth pursuit is noted. Hearing is grossly intact. Tympanic membranes are clear bilaterally. Face is symmetric with normal facial animation and normal facial sensation. Speech is clear with no dysarthria noted. There is no hypophonia. There is no lip, neck/head, jaw or voice tremor. Neck is supple with full range of passive and active motion. There are no carotid bruits on auscultation. Oropharynx exam reveals: moderate mouth dryness, adequate dental hygiene and moderate airway crowding, due to wider tongue, wider uvula and redundant soft palate. Mallampati is class II. Tongue protrudes centrally and palate elevates symmetrically. Tonsils are absent. Neck size is 16.5 inches. He has a Mild overbite. Nasal inspection reveals no significant nasal mucosal bogginess or redness and no septal deviation.   Chest: Clear to auscultation without wheezing, rhonchi or crackles noted.  Heart: S1+S2+0, regular and normal without murmurs, rubs or gallops noted.   Abdomen: Soft, non-tender and non-distended with normal bowel sounds appreciated on auscultation.  Extremities: There is no pitting edema in Robert distal lower extremities bilaterally. Pedal pulses are intact.  Skin: Warm and dry without trophic changes noted. There are no varicose veins.  Musculoskeletal: exam reveals no obvious joint  deformities, tenderness or joint swelling or erythema.   Neurologically:  Mental status: Robert [name] is awake, alert  and oriented in all 4 spheres. His immediate and remote memory, attention, language skills and fund of knowledge are appropriate. There is no evidence of aphasia, agnosia, apraxia or anomia. Speech is clear with normal prosody and enunciation. Thought process is linear. Mood is normal and affect is normal.  Cranial nerves II - XII are as described above under HEENT exam. In addition: shoulder shrug is normal with equal shoulder height noted. Motor exam: Normal bulk, strength and tone is noted. There is no drift, tremor or rebound. Romberg is negative. Reflexes are 2+ throughout. Babinski: Toes are flexor bilaterally. Fine motor skills and coordination: intact with normal finger taps, normal hand movements, normal rapid alternating patting, normal foot taps and normal foot agility.  Cerebellar testing: No dysmetria or intention tremor on finger to nose testing. Heel to shin is unremarkable bilaterally. There is no truncal or gait ataxia.  Sensory exam: intact to light touch, pinprick, vibration, temperature sense in Robert upper and lower extremities, with Robert exception of decreased pinprick sensation in Robert forearms.  Gait, station and balance: He stands with difficulty. No veering to one side is noted. No leaning to one side is noted. Posture is [name]-appropriate and stance is narrow based. Gait shows normal stride length and normal pace. No problems turning are noted. He turns en bloc. Tandem walk is slightly difficult for him.   Assessment and Plan:  In summary, Robert [name] is a very pleasant 64 y.o.-year old male with an underlying complicated medical history of COPD, smoking, hyperlipidemia, allergies, anxiety, asthma, depression, reflux disease, IBS, mitral valve regurgitation, Langerhans' cell histiocytosis, chronic low back pain (hydrocodone 10/325 mg, up to 1 qid, but he take 1/2  pill bid on average), hearing loss, coronary artery disease, and obesity, who was previously diagnosed with obstructive sleep apnea and placed on CPAP therapy. His original sleep apnea diagnosis was about 12 years ago and about 8-12 months ago his CPAP machine broke and since then he has remained without treatment. His history and physical exam are in keeping with obstructive sleep apnea (OSA). in addition, he endorses restless leg symptoms and periodic leg movements of sleep. I had a long chat with Robert [name] about my findings and Robert diagnosis of OSA, its prognosis and treatment options. We talked about medical treatments, surgical interventions and non-pharmacological approaches. I explained in particular Robert risks and ramifications of untreated moderate to severe OSA, especially with respect to developing cardiovascular disease down Robert Road, including congestive heart failure, difficult to treat hypertension, cardiac arrhythmias, or stroke. Even type 2 diabetes has, in part, been linked to untreated OSA. Symptoms of untreated OSA include daytime sleepiness, memory problems, mood irritability and mood disorder such as depression and anxiety, lack of energy, as well as recurrent headaches, especially morning headaches. We talked about smoking cessation and trying to maintain a healthy lifestyle in general, as well as Robert importance of weight control. I encouraged Robert [name] to eat healthy, exercise daily and keep well hydrated, to keep a scheduled bedtime and wake time routine, to not skip any meals and eat healthy snacks in between meals. I advised Robert [name] not to drive when feeling sleepy. I recommended Robert following at this time: sleep study with potential positive airway pressure titration. (We will score hypopneas at 4% and split Robert sleep study into diagnostic and treatment portion, if Robert estimated. 2 hour AHI is >15/h).   I explained Robert sleep test procedure to Robert [name] and also outlined  possible surgical and  non-surgical treatment options of OSA, including Robert use of a custom-made dental device (which would require a referral to a specialist dentist or oral surgeon), upper airway surgical options, such as pillar implants, radiofrequency surgery, tongue base surgery, and UPPP (which would involve a referral to an ENT surgeon). Rarely, jaw surgery such as mandibular advancement may be considered.  I also explained Robert CPAP treatment option to Robert [name], who indicated that he would be willing to go back on CPAP treatment if Robert need arises. I explained Robert importance of being compliant with PAP treatment, not only for insurance purposes but primarily to improve His symptoms, and for Robert [name]'s long term health benefit, including to reduce His cardiovascular risks. I answered all his questions today and Robert [name] was in agreement. I would like to see him back after Robert sleep study is completed and encouraged him to call with any interim questions, concerns, problems or updates.   Thank you very much for allowing me to participate in Robert care of this nice [name]. If I can be of any further assistance to you please do not hesitate to call me at [phone].  Sincerely,   Robert Age, MD, PhD

## 2015-01-14 NOTE — Patient Instructions (Signed)

## 2015-01-16 ENCOUNTER — Encounter (HOSPITAL_COMMUNITY)
Admission: RE | Disposition: A | Discharge status: Home / Self Care | Payer: Self-pay | Source: Ambulatory Visit | Attending: Internal Medicine

## 2015-01-16 ENCOUNTER — Encounter (HOSPITAL_COMMUNITY): Payer: Self-pay

## 2015-01-16 ENCOUNTER — Ambulatory Visit (HOSPITAL_COMMUNITY)
Admission: RE | Admit: 2015-01-16 | Discharge: 2015-01-16 | Disposition: A | Discharge status: Home / Self Care | Payer: Medicare Other | Source: Ambulatory Visit | Attending: Internal Medicine | Admitting: Internal Medicine

## 2015-01-16 DIAGNOSIS — Z7982 Long term (current) use of aspirin: Secondary | ICD-10-CM | POA: Insufficient documentation

## 2015-01-16 DIAGNOSIS — K648 Other hemorrhoids: Secondary | ICD-10-CM | POA: Diagnosis not present

## 2015-01-16 DIAGNOSIS — K921 Melena: Secondary | ICD-10-CM | POA: Diagnosis not present

## 2015-01-16 DIAGNOSIS — Z86010 Personal history of colonic polyps: Secondary | ICD-10-CM | POA: Diagnosis not present

## 2015-01-16 DIAGNOSIS — R195 Other fecal abnormalities: Secondary | ICD-10-CM | POA: Diagnosis not present

## 2015-01-16 DIAGNOSIS — Z79899 Other long term (current) drug therapy: Secondary | ICD-10-CM | POA: Diagnosis not present

## 2015-01-16 DIAGNOSIS — J449 Chronic obstructive pulmonary disease, unspecified: Secondary | ICD-10-CM | POA: Insufficient documentation

## 2015-01-16 DIAGNOSIS — I10 Essential (primary) hypertension: Secondary | ICD-10-CM | POA: Insufficient documentation

## 2015-01-16 DIAGNOSIS — E785 Hyperlipidemia, unspecified: Secondary | ICD-10-CM | POA: Diagnosis not present

## 2015-01-16 DIAGNOSIS — K649 Unspecified hemorrhoids: Secondary | ICD-10-CM | POA: Insufficient documentation

## 2015-01-16 DIAGNOSIS — F418 Other specified anxiety disorders: Secondary | ICD-10-CM | POA: Diagnosis not present

## 2015-01-16 DIAGNOSIS — F17210 Nicotine dependence, cigarettes, uncomplicated: Secondary | ICD-10-CM | POA: Insufficient documentation

## 2015-01-16 HISTORY — PX: COLONOSCOPY: SHX5424

## 2015-01-16 SURGERY — COLONOSCOPY
Anesthesia: Moderate Sedation

## 2015-01-16 MED ORDER — SODIUM CHLORIDE 0.9 % IJ SOLN
INTRAMUSCULAR | Status: AC
  Filled 2015-01-16: qty 3

## 2015-01-16 MED ORDER — SODIUM CHLORIDE 0.9 % IV SOLN
INTRAVENOUS | Status: DC
  Administered 2015-01-16: 13:00:00 via INTRAVENOUS

## 2015-01-16 MED ORDER — MIDAZOLAM HCL 5 MG/5ML IJ SOLN
INTRAMUSCULAR | Status: AC
  Filled 2015-01-16: qty 10

## 2015-01-16 MED ORDER — MEPERIDINE HCL 100 MG/ML IJ SOLN
INTRAMUSCULAR | Status: DC | PRN
  Administered 2015-01-16 (×2): 50 mg via INTRAVENOUS
  Administered 2015-01-16: 25 mg via INTRAVENOUS

## 2015-01-16 MED ORDER — PROMETHAZINE HCL 25 MG/ML IJ SOLN
12.5000 mg | Freq: Once | INTRAMUSCULAR | Status: AC
  Administered 2015-01-16: 12.5 mg via INTRAVENOUS

## 2015-01-16 MED ORDER — SIMETHICONE 40 MG/0.6ML PO SUSP
ORAL | Status: DC | PRN
  Administered 2015-01-16: 13:00:00

## 2015-01-16 MED ORDER — MIDAZOLAM HCL 5 MG/5ML IJ SOLN
INTRAMUSCULAR | Status: DC | PRN
  Administered 2015-01-16 (×3): 2 mg via INTRAVENOUS

## 2015-01-16 MED ORDER — ONDANSETRON HCL 4 MG/2ML IJ SOLN
INTRAMUSCULAR | Status: AC
  Filled 2015-01-16: qty 2

## 2015-01-16 MED ORDER — ONDANSETRON HCL 4 MG/2ML IJ SOLN
INTRAMUSCULAR | Status: DC | PRN
  Administered 2015-01-16: 4 mg via INTRAVENOUS

## 2015-01-16 MED ORDER — MEPERIDINE HCL 100 MG/ML IJ SOLN
INTRAMUSCULAR | Status: AC
  Filled 2015-01-16: qty 2

## 2015-01-16 MED ORDER — PROMETHAZINE HCL 25 MG/ML IJ SOLN
INTRAMUSCULAR | Status: AC
  Filled 2015-01-16: qty 1

## 2015-01-16 NOTE — Interval H&P Note (Signed)
History and Physical Interval Note:  01/16/2015 1:07 PM  [name]  has presented today for surgery, with the diagnosis of heme postive stool, hematochezia  The various methods of treatment have been discussed with the patient and family. After consideration of risks, benefits and other options for treatment, the patient has consented to  Procedure(s) with comments: COLONOSCOPY (N/A) - 1330 as a surgical intervention .  The patient's history has been reviewed, patient examined, no change in status, stable for surgery.  I have reviewed the patient's chart and labs.  Questions were answered to the patient's satisfaction.     [name]  No change. Little constipated each morning. No prior history of using MiraLAX. Diagnostic colonoscopy today per plan. Colonoscopy Discharge Instructions  Read the instructions outlined below and refer to this sheet in the next few weeks. These discharge instructions provide you with general information on caring for yourself after you leave the hospital. Your doctor may also give you specific instructions. While your treatment has been planned according to the most current medical practices available, unavoidable complications occasionally occur. If you have any problems or questions after discharge, call Dr. Gala [name] at [phone]. ACTIVITY  You may resume your regular activity, but move at a slower pace for the next 24 hours.   Take frequent rest periods for the next 24 hours.   Walking will help get rid of the air and reduce the bloated feeling in your belly (abdomen).   No driving for 24 hours (because of the medicine (anesthesia) used during the test).    Do not sign any important legal documents or operate any machinery for 24 hours (because of the anesthesia used during the test).  NUTRITION  Drink plenty of fluids.   You may resume your normal diet as instructed by your doctor.   Begin with a light meal and progress to your normal diet. Heavy  or fried foods are harder to digest and may make you feel sick to your stomach (nauseated).   Avoid alcoholic beverages for 24 hours or as instructed.  MEDICATIONS  You may resume your normal medications unless your doctor tells you otherwise.  WHAT YOU CAN EXPECT TODAY  Some feelings of bloating in the abdomen.   Passage of more gas than usual.   Spotting of blood in your stool or on the toilet paper.  IF YOU HAD POLYPS REMOVED DURING THE COLONOSCOPY:  No aspirin products for 7 days or as instructed.   No alcohol for 7 days or as instructed.   Eat a soft diet for the next 24 hours.  FINDING OUT THE RESULTS OF YOUR TEST Not all test results are available during your visit. If your test results are not back during the visit, make an appointment with your caregiver to find out the results. Do not assume everything is normal if you have not heard from your caregiver or the medical facility. It is important for you to follow up on all of your test results.  SEEK IMMEDIATE MEDICAL ATTENTION IF:  You have more than a spotting of blood in your stool.   Your belly is swollen (abdominal distention).   You are nauseated or vomiting.   You have a temperature over 101.  You have abdominal pain or discomfort that is severe or gets worse throughout the day.

## 2015-01-16 NOTE — H&P (View-Only) (Signed)
Subjective:    Patient ID: [name] is a 64 y.o. male.  HPI     Star Age, MD, PhD [facility] [address] [place], [place] [address]  Dear Dr. Gerarda Fraction,   I saw your patient, [name], upon your kind request in my neurologic clinic today for initial consultation of his sleep disorder, in particular his obstructive sleep apnea. The patient is unaccompanied today. As you know, [name] is a 63 year old right-handed gentleman with an underlying complicated medical history of COPD, smoking, hyperlipidemia, allergies, anxiety, asthma, depression, reflux disease, IBS, mitral valve regurgitation, Langerhans' cell histiocytosis, chronic low back pain (hydrocodone 10/325 mg, up to 1 qid, but he take 1/2 pill bid on average), hearing loss, coronary artery disease, and obesity, who was previously diagnosed with obstructive sleep apnea and placed on CPAP therapy. About 8 to 12 months ago, his machine broke. Prior sleep test results are not available for my review today. He was diagnosed with sleep apnea about 12 years ago. His younger half brothers have OSA.  He feels tired during the day. He snores according to his wife and also has pauses in his breathing while asleep. He is trying to quit smoking and currently is smoking half a pack per day. She drinks coffee 2-3 cups in the mornings and no sodas. He does not drink alcohol. He reports that his father was alcoholic and died young. His Epworth sleepiness score is 1 out of 24 today, his fatigue scores 44 out of 63. He has frequent morning headaches, about 3-4 times per week. He denies nocturia, he has restless leg symptoms and reports leg twitching at night and his sleep study apparently also showed leg twitching he recalls. He has midline low back pain, radiating to the left. He never had back surgery. He tries to use his hydrocodone sparingly. He is retired. He has had nodules on his lung. He saw  Dr. Lake Bells in pulmonology about a year ago and is supposed to go back this year.  His Past Medical History Is Significant For: Past Medical History  Diagnosis Date  . Hypertension   . COPD (chronic obstructive pulmonary disease)   . Dyslipidemia   . Allergic rhinitis   . Asthma   . Anxiety and depression   . GERD (gastroesophageal reflux disease)   . IBS (irritable bowel syndrome)   . Mitral regurgitation   . Langerhan's cell histiocytosis   . Chronic low back pain   . Hearing loss   . Hyperplastic colon polyp 12/28/10  . Tubular adenoma 12/28/10  . Hemorrhoids   . OSA on CPAP   . CAD (coronary artery disease)     mild to mod - LAD lesion in 2006  . History of nuclear stress test 08/2009    bruce protocol; negative for ischemia  . Easy fatigability     His Past Surgical History Is Significant For: Past Surgical History  Procedure Laterality Date  . Inguinal hernia repair      left  . Ear pinna reconstruction w/ rib graft      right  . Cholecystectomy    . Tonsillectomy    . Foot surgery      right  . Lung biopsy  2009    right  . Colonoscopy  2007    friable anal canal, hyperplastic polyp  . Esophagogastroduodenoscopy      multiple dilations, last EGD 2007 showed small hh, adenomatous appearing gastric mucosa in body  but biopsies benign. SB bx negative for Celiac.  Marland Kitchen Esophagogastroduodenoscopy  12/28/2010    Dr. Gala Romney- normal esophagus s/p dilationpatchy erythema and erosions.  Venia Minks dilation  12/28/2010    Procedure: Venia Minks DILATION;  Surgeon: Daneil Dolin, MD;  Location: [facility];  Service: Endoscopy;  Laterality: N/A;  . Colonoscopy  12/28/2010    Dr. Gala Romney- tubular adenoma, hyperplastic polyp  . Transthoracic echocardiogram  02/2011    EF =>55%, normal chamber size & function; mild mitral and aortic insuff, mild-mod tricuspid insuff; mild pulm htn with rsvp of 38mmHg    His Family History Is Significant For: Family History  Problem Relation Age of Onset    . Colon cancer Neg Hx   . Liver disease Neg Hx   . Inflammatory bowel disease Neg Hx   . Heart disease Father 61    etoh/breathing problmes  . GI problems Mother   . Hyperlipidemia Mother   . Heart attack Maternal Grandmother     also valvular problems  . Heart attack Maternal Grandfather   . Diabetes Paternal Grandmother   . Heart Problems Paternal Grandfather   . Heart attack Brother     stenting x3, also cancer  . Skin cancer Brother   . Hypertension Brother   . Hypertension Sister   . COPD Mother   . COPD Father   . COPD Paternal Grandmother     His Social History Is Significant For: Social History   Social History  . Marital Status: Married    Spouse Name: N/A  . Number of Children: 1  . Years of Education: N/A   Occupational History  . disability    Social History Main Topics  . Smoking status: Current Every Day Smoker -- 0.50 packs/day for 44 years    Types: Cigarettes  . Smokeless tobacco: Never Used     Comment: 1/2 pack daily  . Alcohol Use: No  . Drug Use: No  . Sexual Activity: Not Asked   Other Topics Concern  . None   Social History Narrative   Son, age 74, hit by truck.   Drinks about 2-3 cups of coffee a day. Occasionally drinks tea.     His Allergies Are:  Allergies  Allergen Reactions  . Bee Venom Swelling  :   His Current Medications Are:  Outpatient Encounter Prescriptions as of 01/14/2015  Medication Sig  . albuterol (PROVENTIL HFA;VENTOLIN HFA) 108 (90 BASE) MCG/ACT inhaler Inhale 2 puffs into the lungs every 6 (six) hours as needed for wheezing.  Marland Kitchen ALPRAZolam (XANAX) 1 MG tablet Take 1 mg by mouth at bedtime as needed.    Marland Kitchen aspirin 81 MG tablet Take 81 mg by mouth daily.    . fenofibrate (TRICOR) 145 MG tablet Take 145 mg by mouth daily.    . fish oil-omega-3 fatty acids 1000 MG capsule Take 2 g by mouth daily.    Marland Kitchen glucosamine-chondroitin 500-400 MG tablet Take 1 tablet by mouth 2 (two) times daily.  Marland Kitchen  HYDROcodone-acetaminophen (NORCO) 10-325 MG per tablet Take 1 tablet by mouth every 6 (six) hours as needed for pain.  . metoprolol tartrate (LOPRESSOR) 25 MG tablet Take 1 tablet (25 mg total) by mouth 2 (two) times daily.  . Multiple Vitamin (MULTIVITAMIN) capsule Take 1 capsule by mouth daily.    . nitroGLYCERIN (NITROSTAT) 0.4 MG SL tablet Place 1 tablet (0.4 mg total) under the tongue every 5 (five) minutes as needed for chest pain.  . polyethylene glycol-electrolytes (  NULYTELY/GOLYTELY) 420 G solution Take 4,000 mLs by mouth once.  . simvastatin (ZOCOR) 20 MG tablet Take 20 mg by mouth at bedtime.    Marland Kitchen tiotropium (SPIRIVA) 18 MCG inhalation capsule Place 1 capsule (18 mcg total) into inhaler and inhale daily.  . valsartan (DIOVAN) 160 MG tablet Take 80 mg by mouth daily.   Marland Kitchen omeprazole (PRILOSEC) 20 MG capsule Take 1 capsule (20 mg total) by mouth 2 (two) times daily.   No facility-administered encounter medications on file as of 01/14/2015.  :  Review of Systems:  Out of a complete 14 point review of systems, all are reviewed and negative with the exception of these symptoms as listed below:   Review of Systems  Constitutional: Positive for fatigue.  HENT: Positive for hearing loss.   Eyes:       Blurred vision   Respiratory: Positive for cough, shortness of breath and wheezing.        Snoring   Cardiovascular: Positive for chest pain.       Murmur  Gastrointestinal:       Blood in stool   Musculoskeletal:       Joint pain, aching muscles   Neurological: Positive for dizziness, weakness, numbness and headaches.       Last sleep study was about 12 years ago. Last year patient accidentally dropped the CPAP machine in the tub. He has been off of it for 1 year. Snoring, no trouble falling or staying asleep, witnessed apnea, wakes up in the morning feeling tired, denies taking naps.   Hematological: Bruises/bleeds easily.  Psychiatric/Behavioral: Positive for confusion.        Depression, anxiety, decreased energy, suicidal thoughts, racing thoughts    Objective:  Neurologic Exam  Physical Exam Physical Examination:   Filed Vitals:   01/14/15 1426  BP: 113/78  Pulse: 74  Resp:     General Examination: The patient is a very pleasant 64 y.o. male in no acute distress. He appears well-developed and well-nourished and adequately groomed.   HEENT: Normocephalic, atraumatic, pupils are equal, round and reactive to light and accommodation. Funduscopic exam is normal with sharp disc margins noted. Extraocular tracking is good without limitation to gaze excursion or nystagmus noted. Normal smooth pursuit is noted. Hearing is grossly intact. Tympanic membranes are clear bilaterally. Face is symmetric with normal facial animation and normal facial sensation. Speech is clear with no dysarthria noted. There is no hypophonia. There is no lip, neck/head, jaw or voice tremor. Neck is supple with full range of passive and active motion. There are no carotid bruits on auscultation. Oropharynx exam reveals: moderate mouth dryness, adequate dental hygiene and moderate airway crowding, due to wider tongue, wider uvula and redundant soft palate. Mallampati is class II. Tongue protrudes centrally and palate elevates symmetrically. Tonsils are absent. Neck size is 16.5 inches. He has a Mild overbite. Nasal inspection reveals no significant nasal mucosal bogginess or redness and no septal deviation.   Chest: Clear to auscultation without wheezing, rhonchi or crackles noted.  Heart: S1+S2+0, regular and normal without murmurs, rubs or gallops noted.   Abdomen: Soft, non-tender and non-distended with normal bowel sounds appreciated on auscultation.  Extremities: There is no pitting edema in the distal lower extremities bilaterally. Pedal pulses are intact.  Skin: Warm and dry without trophic changes noted. There are no varicose veins.  Musculoskeletal: exam reveals no obvious joint  deformities, tenderness or joint swelling or erythema.   Neurologically:  Mental status: The patient is awake,  alert and oriented in all 4 spheres. His immediate and remote memory, attention, language skills and fund of knowledge are appropriate. There is no evidence of aphasia, agnosia, apraxia or anomia. Speech is clear with normal prosody and enunciation. Thought process is linear. Mood is normal and affect is normal.  Cranial nerves II - XII are as described above under HEENT exam. In addition: shoulder shrug is normal with equal shoulder height noted. Motor exam: Normal bulk, strength and tone is noted. There is no drift, tremor or rebound. Romberg is negative. Reflexes are 2+ throughout. Babinski: Toes are flexor bilaterally. Fine motor skills and coordination: intact with normal finger taps, normal hand movements, normal rapid alternating patting, normal foot taps and normal foot agility.  Cerebellar testing: No dysmetria or intention tremor on finger to nose testing. Heel to shin is unremarkable bilaterally. There is no truncal or gait ataxia.  Sensory exam: intact to light touch, pinprick, vibration, temperature sense in the upper and lower extremities, with the exception of decreased pinprick sensation in the forearms.  Gait, station and balance: He stands with difficulty. No veering to one side is noted. No leaning to one side is noted. Posture is age-appropriate and stance is narrow based. Gait shows normal stride length and normal pace. No problems turning are noted. He turns en bloc. Tandem walk is slightly difficult for him.   Assessment and Plan:  In summary, KELSON QUEENAN is a very pleasant 64 y.o.-year old male with an underlying complicated medical history of COPD, smoking, hyperlipidemia, allergies, anxiety, asthma, depression, reflux disease, IBS, mitral valve regurgitation, Langerhans' cell histiocytosis, chronic low back pain (hydrocodone 10/325 mg, up to 1 qid, but he take 1/2  pill bid on average), hearing loss, coronary artery disease, and obesity, who was previously diagnosed with obstructive sleep apnea and placed on CPAP therapy. His original sleep apnea diagnosis was about 12 years ago and about 8-12 months ago his CPAP machine broke and since then he has remained without treatment. His history and physical exam are in keeping with obstructive sleep apnea (OSA). in addition, he endorses restless leg symptoms and periodic leg movements of sleep. I had a long chat with the patient about my findings and the diagnosis of OSA, its prognosis and treatment options. We talked about medical treatments, surgical interventions and non-pharmacological approaches. I explained in particular the risks and ramifications of untreated moderate to severe OSA, especially with respect to developing cardiovascular disease down the Road, including congestive heart failure, difficult to treat hypertension, cardiac arrhythmias, or stroke. Even type 2 diabetes has, in part, been linked to untreated OSA. Symptoms of untreated OSA include daytime sleepiness, memory problems, mood irritability and mood disorder such as depression and anxiety, lack of energy, as well as recurrent headaches, especially morning headaches. We talked about smoking cessation and trying to maintain a healthy lifestyle in general, as well as the importance of weight control. I encouraged the patient to eat healthy, exercise daily and keep well hydrated, to keep a scheduled bedtime and wake time routine, to not skip any meals and eat healthy snacks in between meals. I advised the patient not to drive when feeling sleepy. I recommended the following at this time: sleep study with potential positive airway pressure titration. (We will score hypopneas at 4% and split the sleep study into diagnostic and treatment portion, if the estimated. 2 hour AHI is >15/h).   I explained the sleep test procedure to the patient and also outlined  possible surgical  and non-surgical treatment options of OSA, including the use of a custom-made dental device (which would require a referral to a specialist dentist or oral surgeon), upper airway surgical options, such as pillar implants, radiofrequency surgery, tongue base surgery, and UPPP (which would involve a referral to an ENT surgeon). Rarely, jaw surgery such as mandibular advancement may be considered.  I also explained the CPAP treatment option to the patient, who indicated that he would be willing to go back on CPAP treatment if the need arises. I explained the importance of being compliant with PAP treatment, not only for insurance purposes but primarily to improve His symptoms, and for the patient's long term health benefit, including to reduce His cardiovascular risks. I answered all his questions today and the patient was in agreement. I would like to see him back after the sleep study is completed and encouraged him to call with any interim questions, concerns, problems or updates.   Thank you very much for allowing me to participate in the care of this nice patient. If I can be of any further assistance to you please do not hesitate to call me at [phone].  Sincerely,   Star Age, MD, PhD

## 2015-01-16 NOTE — H&P (View-Only) (Signed)
Primary Care Physician:  Glo Herring., MD Primary Gastroenterologist:  Dr. Gala Romney  Pre-Procedure History & Physical: HPI:  [name] is a 64 y.o. male here for followup of GERD. Doing well on omeprazole 20 mg daily. No dysphagia. Schatzki's ring previously dilated. Intermittent episodes of painless hematochezia. Sometimes covers the entire toilet water red. Denies straining. He denies constipation. Does take hydrocodone regularly. Adenoma removed his colon in 2012 the setting of a suboptimal prep. Patient due for repeat colonoscopy next year. No significant intercurrent medical problems. No dysphagia, nausea, vomiting or early satiety. Denies bowel pain. Patient wants his prostate checked.  Past Medical History  Diagnosis Date  . Hypertension   . COPD (chronic obstructive pulmonary disease)   . Dyslipidemia   . Allergic rhinitis   . Asthma   . Anxiety and depression   . GERD (gastroesophageal reflux disease)   . IBS (irritable bowel syndrome)   . Mitral regurgitation   . Langerhan's cell histiocytosis   . Chronic low back pain   . Hearing loss   . Hyperplastic colon polyp 12/28/10  . Tubular adenoma 12/28/10  . Hemorrhoids   . OSA on CPAP   . CAD (coronary artery disease)     mild to mod - LAD lesion in 2006  . History of nuclear stress test 08/2009    bruce protocol; negative for ischemia    Past Surgical History  Procedure Laterality Date  . Inguinal hernia repair      left  . Ear pinna reconstruction w/ rib graft      right  . Cholecystectomy    . Tonsillectomy    . Foot surgery      right  . Lung biopsy  2009    right  . Colonoscopy  2007    friable anal canal, hyperplastic polyp  . Esophagogastroduodenoscopy      multiple dilations, last EGD 2007 showed small hh, adenomatous appearing gastric mucosa in body but biopsies benign. SB bx negative for Celiac.  Marland Kitchen Esophagogastroduodenoscopy  12/28/2010    Dr. Gala Romney- normal esophagus s/p dilationpatchy erythema  and erosions.  Venia Minks dilation  12/28/2010    Procedure: Venia Minks DILATION;  Surgeon: Daneil Dolin, MD;  Location: [facility];  Service: Endoscopy;  Laterality: N/A;  . Colonoscopy  12/28/2010    Dr. Gala Romney- tubular adenoma, hyperplastic polyp  . Transthoracic echocardiogram  02/2011    EF =>55%, normal chamber size & function; mild mitral and aortic insuff, mild-mod tricuspid insuff; mild pulm htn with rsvp of 46mmHg    Prior to Admission medications   Medication Sig Start Date End Date Taking? Authorizing Provider  albuterol (PROVENTIL HFA;VENTOLIN HFA) 108 (90 BASE) MCG/ACT inhaler Inhale 2 puffs into the lungs every 6 (six) hours as needed for wheezing.   Yes Historical Provider, MD  ALPRAZolam Duanne Moron) 1 MG tablet Take 1 mg by mouth at bedtime as needed.     Yes Historical Provider, MD  aspirin 81 MG tablet Take 81 mg by mouth daily.     Yes Historical Provider, MD  fenofibrate (TRICOR) 145 MG tablet Take 145 mg by mouth daily.     Yes Historical Provider, MD  fish oil-omega-3 fatty acids 1000 MG capsule Take 2 g by mouth daily.     Yes Historical Provider, MD  glucosamine-chondroitin 500-400 MG tablet Take 1 tablet by mouth 2 (two) times daily.   Yes Historical Provider, MD  HYDROcodone-acetaminophen (NORCO) 10-325 MG per tablet Take 1 tablet by  mouth every 6 (six) hours as needed for pain.   Yes Historical Provider, MD  metoprolol tartrate (LOPRESSOR) 25 MG tablet Take 1 tablet (25 mg total) by mouth 2 (two) times daily. 04/22/14  Yes Pixie Casino, MD  Multiple Vitamin (MULTIVITAMIN) capsule Take 1 capsule by mouth daily.     Yes Historical Provider, MD  simvastatin (ZOCOR) 20 MG tablet Take 20 mg by mouth at bedtime.     Yes Historical Provider, MD  tiotropium (SPIRIVA) 18 MCG inhalation capsule Place 1 capsule (18 mcg total) into inhaler and inhale daily. 11/07/13  Yes Juanito Doom, MD  valsartan (DIOVAN) 160 MG tablet Take 80 mg by mouth daily.    Yes Historical Provider, MD    nitroGLYCERIN (NITROSTAT) 0.4 MG SL tablet Place 1 tablet (0.4 mg total) under the tongue every 5 (five) minutes as needed for chest pain. Patient not taking: Reported on 12/30/2014 03/21/13   Pixie Casino, MD  NON FORMULARY at bedtime. CPAP    Historical Provider, MD  omeprazole (PRILOSEC) 20 MG capsule Take 1 capsule (20 mg total) by mouth 2 (two) times daily. 11/26/12 11/26/13  Orvil Feil, NP    Allergies as of 12/30/2014 - Review Complete 12/30/2014  Allergen Reaction Noted  . Bee venom Swelling 12/28/2010    Family History  Problem Relation Age of Onset  . Colon cancer Neg Hx   . Liver disease Neg Hx   . Inflammatory bowel disease Neg Hx   . Heart disease Father 56    etoh/breathing problmes  . GI problems Mother   . Hyperlipidemia Mother   . Heart attack Maternal Grandmother     also valvular problems  . Heart attack Maternal Grandfather   . Diabetes Paternal Grandmother   . Heart Problems Paternal Grandfather   . Heart attack Brother     stenting x3, also cancer  . Skin cancer Brother   . Hypertension Brother   . Hypertension Sister   . COPD Mother   . COPD Father   . COPD Paternal Grandmother     History   Social History  . Marital Status: Married    Spouse Name: N/A  . Number of Children: 1  . Years of Education: N/A   Occupational History  . disability    Social History Main Topics  . Smoking status: Current Every Day Smoker -- 0.50 packs/day for 44 years    Types: Cigarettes  . Smokeless tobacco: Never Used     Comment: 1/2 pack daily  . Alcohol Use: No  . Drug Use: No  . Sexual Activity: Not on file   Other Topics Concern  . Not on file   Social History Narrative   Son, age 51, hit by truck.    Review of Systems: See HPI, otherwise negative ROS  Physical Exam: BP 130/84 mmHg  Pulse 69  Temp(Src) 96.8 F (36 C) (Oral)  Ht 5\' 8"  (1.727 m)  Wt 197 lb 9.6 oz (89.631 kg)  BMI 30.05 kg/m2 General:   Alert,  Well-developed, well-nourished,  pleasant and cooperative in NAD Skin:  Intact without significant lesions or rashes. Eyes:  Sclera clear, no icterus.   Conjunctiva pink. Ears:  Normal auditory acuity. Nose:  No deformity, discharge,  or lesions. Mouth:  No deformity or lesions. Neck:  Supple; no masses or thyromegaly. No significant cervical adenopathy. Lungs:  Clear throughout to auscultation.   No wheezes, crackles, or rhonchi. No acute distress. Heart:  Regular rate  and rhythm; no murmurs, clicks, rubs,  or gallops. Abdomen: Non-distended, normal bowel sounds.  Soft and nontender without appreciable mass or hepatosplenomegaly.  Pulses:  Normal pulses noted. Extremities:  Without clubbing or edema. Rectal:   No external lesions. Good sphincter tone scant brown formed stool in rectal vault. Prostate nontender.  no nodularity  Stool is trace Hemoccult positive   Impression:   Pleasant 64 year old gentleman with well-controlled GERD. On omeprazole 20 mg daily.  Discussed the long-term risks/benefits of chronic acid suppression therapy. In the case of Mr. Brock Bad, I feel the benefits far outweigh the risk. He notes intermittent hematochezia. Hemoccult-positive today. Likely benign and a rectal source of bleeding however, we need to go ahead and do a colonoscopy now. Prostate palpated to be normal on today's digital rectal exam. However, I told patient that is no substitute for regular health maintenance and seeing his primary care physician, Dr. Gerarda Fraction, regarding health maintenance issues.  Recommendations:   Schedule diagnostic colonoscopy (hematochezia and hemocult positive stool)  Phenergan 12.5 mg IV prior to colonoscopy  Split movie prep  Continue Omeparazole daily (benefits outweigh the risks as discussed)  Smoking cessation strongly recommended.  Further recommendations to follow      Notice: This dictation was prepared with Dragon dictation along with smaller phrase technology. Any transcriptional errors that  result from this process are unintentional and may not be corrected upon review.

## 2015-01-16 NOTE — Op Note (Signed)
[facility] [address] [place], [address]   COLONOSCOPY PROCEDURE REPORT  PATIENT: [name], [name]  MR#: [id number] BIRTHDATE: 10-05-1950 , 63  yrs. old GENDER: male ENDOSCOPIST: R.  Garfield Cornea, MD FACP [facility] REFERRED ZD:[id number] Gerarda Fraction, M.D. PROCEDURE DATE:  01/30/2015 PROCEDURE:   Colonoscopy, diagnostic INDICATIONS:Hemoccult-positive stool; paper hematochezia; history of colonic adenoma. MEDICATIONS: Versed 6 mg IV and Demerol 125 mg IV in divided doses. Zofran 4 mg IV.  Phenergan 12.5 mg IV ASA CLASS:       Class II  CONSENT: The risks, benefits, alternatives and imponderables including but not limited to bleeding, perforation as well as the possibility of a missed lesion have been reviewed.  The potential for biopsy, lesion removal, etc. have also been discussed. Questions have been answered.  All parties agreeable.  Please see the history and physical in the medical record for more information.  DESCRIPTION OF PROCEDURE:   After the risks benefits and alternatives of the procedure were thoroughly explained, informed consent was obtained.  The digital rectal exam revealed no abnormalities of the rectum.   The EC-[id number] ([id number])  endoscope was introduced through the anus and advanced to the cecum, which was identified by both the appendix and ileocecal valve. No adverse events experienced.   The quality of the prep was adequate  The instrument was then slowly withdrawn as the colon was fully examined. Estimated blood loss is zero unless otherwise noted in this procedure report.      COLON FINDINGS: Anal canal and internal hemorrhoids; otherwise, normal-appearing rectal mucosa.  Normal-appearing colonic mucosa. Retroflexion was performed. .  Withdrawal time=6 minutes 0 seconds.  The scope was withdrawn and the procedure completed. COMPLICATIONS: There were no immediate complications.  ENDOSCOPIC IMPRESSION: Normal colonoscopy  (hemorrhoids?"likely source of hematochezia)  RECOMMENDATIONS: Repeat colonoscopy in 5 years for surveillance purposes. Schedule office appointment with [place] in 4 weeks for consideration of hemorrhoid banding.  eSigned:  R. Garfield Cornea, MD [facility] Jan 30, 2015 1:51 PM   cc:  CPT CODES: ICD CODES:  The ICD and CPT codes recommended by this software are interpretations from the data that the clinical staff has captured with the software.  The verification of the translation of this report to the ICD and CPT codes and modifiers is the sole responsibility of the health care institution and practicing physician where this report was generated.  [employer] will not be held responsible for the validity of the ICD and CPT codes included on this report.  AMA assumes no liability for data contained or not contained herein. CPT is a Designer, television/film set of the [employer].

## 2015-01-16 NOTE — Interval H&P Note (Signed)
History and Physical Interval Note:  01/16/2015 1:08 PM  [name]  has presented today for surgery, with the diagnosis of heme postive stool, hematochezia  The various methods of treatment have been discussed with the patient and family. After consideration of risks, benefits and other options for treatment, the patient has consented to  Procedure(s) with comments: COLONOSCOPY (N/A) - 1330 as a surgical intervention .  The patient's history has been reviewed, patient examined, no change in status, stable for surgery.  I have reviewed the patient's chart and labs.  Questions were answered to the patient's satisfaction.     Manus Rudd

## 2015-01-16 NOTE — Discharge Instructions (Signed)
Colonoscopy Discharge Instructions  Read the instructions outlined below and refer to this sheet in the next few weeks. These discharge instructions provide you with general information on caring for yourself after you leave the hospital. Your doctor may also give you specific instructions. While your treatment has been planned according to the most current medical practices available, unavoidable complications occasionally occur. If you have any problems or questions after discharge, call Dr. Gala Romney at [phone]. ACTIVITY  You may resume your regular activity, but move at a slower pace for the next 24 hours.   Take frequent rest periods for the next 24 hours.   Walking will help get rid of the air and reduce the bloated feeling in your belly (abdomen).   No driving for 24 hours (because of the medicine (anesthesia) used during the test).    Do not sign any important legal documents or operate any machinery for 24 hours (because of the anesthesia used during the test).  NUTRITION  Drink plenty of fluids.   You may resume your normal diet as instructed by your doctor.   Begin with a light meal and progress to your normal diet. Heavy or fried foods are harder to digest and may make you feel sick to your stomach (nauseated).   Avoid alcoholic beverages for 24 hours or as instructed.  MEDICATIONS  You may resume your normal medications unless your doctor tells you otherwise.  WHAT YOU CAN EXPECT TODAY  Some feelings of bloating in the abdomen.   Passage of more gas than usual.   Spotting of blood in your stool or on the toilet paper.  IF YOU HAD POLYPS REMOVED DURING THE COLONOSCOPY:  No aspirin products for 7 days or as instructed.   No alcohol for 7 days or as instructed.   Eat a soft diet for the next 24 hours.  FINDING OUT THE RESULTS OF YOUR TEST Not all test results are available during your visit. If your test results are not back during the visit, make an appointment  with your caregiver to find out the results. Do not assume everything is normal if you have not heard from your caregiver or the medical facility. It is important for you to follow up on all of your test results.  SEEK IMMEDIATE MEDICAL ATTENTION IF:  You have more than a spotting of blood in your stool.   Your belly is swollen (abdominal distention).   You are nauseated or vomiting.   You have a temperature over 101.   You have abdominal pain or discomfort that is severe or gets worse throughout the day.    Constipation and hemorrhoid information provided  Take 1 capful of MiraLAX at bedtime  Office follow-up with me in 4 weeks for consideration of hemorrhoid banding  Repeat colonoscopy in 5 years   Hemorrhoids Hemorrhoids are puffy (swollen) veins around the rectum or anus. Hemorrhoids can cause pain, itching, bleeding, or irritation. HOME CARE  Eat foods with fiber, such as whole grains, beans, nuts, fruits, and vegetables. Ask your doctor about taking products with added fiber in them (fibersupplements).  Drink enough fluid to keep your pee (urine) clear or pale yellow.  Exercise often.  Go to the bathroom when you have the urge to poop. Do not wait.  Avoid straining to poop (bowel movement).  Keep the butt area dry and clean. Use wet toilet paper or moist paper towels.  Medicated creams and medicine inserted into the anus (anal suppository) may be used or  applied as told.  Only take medicine as told by your doctor.  Take a warm water bath (sitz bath) for 15-20 minutes to ease pain. Do this 3-4 times a day.  Place ice packs on the area if it is tender or puffy. Use the ice packs between the warm water baths.  Put ice in a plastic bag.  Place a towel between your skin and the bag.  Leave the ice on for 15-20 minutes, 03-04 times a day.  Do not use a donut-shaped pillow or sit on the toilet for a long time. GET HELP RIGHT AWAY IF:   You have more pain that  is not controlled by treatment or medicine.  You have bleeding that will not stop.  You have trouble or are unable to poop (bowel movement).  You have pain or puffiness outside the area of the hemorrhoids. MAKE SURE YOU:   Understand these instructions.  Will watch your condition.  Will get help right away if you are not doing well or get worse. Document Released: 02/16/2008 Document Revised: 04/25/2012 Document Reviewed: 03/20/2012 [facility] Patient Information 2015 [place], [place]. This information is not intended to replace advice given to you by your health care provider. Make sure you discuss any questions you have with your health care provider. Constipation Constipation is when a person has fewer than three bowel movements a week, has difficulty having a bowel movement, or has stools that are dry, hard, or larger than normal. As people grow older, constipation is more common. If you try to fix constipation with medicines that make you have a bowel movement (laxatives), the problem may get worse. Long-term laxative use may cause the muscles of the colon to become weak. A low-fiber diet, not taking in enough fluids, and taking certain medicines may make constipation worse.  CAUSES   Certain medicines, such as antidepressants, pain medicine, iron supplements, antacids, and water pills.   Certain diseases, such as diabetes, irritable bowel syndrome (IBS), thyroid disease, or depression.   Not drinking enough water.   Not eating enough fiber-rich foods.   Stress or travel.   Lack of physical activity or exercise.   Ignoring the urge to have a bowel movement.   Using laxatives too much.  SIGNS AND SYMPTOMS   Having fewer than three bowel movements a week.   Straining to have a bowel movement.   Having stools that are hard, dry, or larger than normal.   Feeling full or bloated.   Pain in the lower abdomen.   Not feeling relief after having a bowel movement.   DIAGNOSIS  Your health care provider will take a medical history and perform a physical exam. Further testing may be done for severe constipation. Some tests may include:  A barium enema X-ray to examine your rectum, colon, and, sometimes, your small intestine.   A sigmoidoscopy to examine your lower colon.   A colonoscopy to examine your entire colon. TREATMENT  Treatment will depend on the severity of your constipation and what is causing it. Some dietary treatments include drinking more fluids and eating more fiber-rich foods. Lifestyle treatments may include regular exercise. If these diet and lifestyle recommendations do not help, your health care provider may recommend taking over-the-counter laxative medicines to help you have bowel movements. Prescription medicines may be prescribed if over-the-counter medicines do not work.  HOME CARE INSTRUCTIONS   Eat foods that have a lot of fiber, such as fruits, vegetables, whole grains, and beans.  Limit foods  high in fat and processed sugars, such as french fries, hamburgers, cookies, candies, and soda.   A fiber supplement may be added to your diet if you cannot get enough fiber from foods.   Drink enough fluids to keep your urine clear or pale yellow.   Exercise regularly or as directed by your health care provider.   Go to the restroom when you have the urge to go. Do not hold it.   Only take over-the-counter or prescription medicines as directed by your health care provider. Do not take other medicines for constipation without talking to your health care provider first.  Kappa IF:   You have bright red blood in your stool.   Your constipation lasts for more than 4 days or gets worse.   You have abdominal or rectal pain.   You have thin, pencil-like stools.   You have unexplained weight loss. MAKE SURE YOU:   Understand these instructions.  Will watch your condition.  Will get help  right away if you are not doing well or get worse. Document Released: 02/05/2004 Document Revised: 05/14/2013 Document Reviewed: 02/18/2013 [facility] Patient Information 2015 [place], [place]. This information is not intended to replace advice given to you by your health care provider. Make sure you discuss any questions you have with your health care provider.

## 2015-01-19 ENCOUNTER — Telehealth: Payer: Self-pay | Admitting: General Practice

## 2015-01-19 ENCOUNTER — Encounter: Payer: Self-pay | Admitting: Internal Medicine

## 2015-01-19 NOTE — Telephone Encounter (Signed)
Routing to [place] to schedule and brochure placed in the mail

## 2015-01-19 NOTE — Telephone Encounter (Signed)
APPT MADE AND LETTER SENT  °

## 2015-01-19 NOTE — Telephone Encounter (Signed)
-----   Message from Daneil Dolin, MD sent at 01/16/2015  1:52 PM EDT ----- This is another banding candidate. Please send him pamphlet on banding. Plan for a follow-up with me in about 4 weeks

## 2015-01-21 ENCOUNTER — Encounter (HOSPITAL_COMMUNITY): Payer: Self-pay | Admitting: Internal Medicine

## 2015-02-05 ENCOUNTER — Ambulatory Visit (INDEPENDENT_AMBULATORY_CARE_PROVIDER_SITE_OTHER): Payer: Medicare Other | Admitting: Neurology

## 2015-02-05 DIAGNOSIS — G4720 Circadian rhythm sleep disorder, unspecified type: Secondary | ICD-10-CM

## 2015-02-05 DIAGNOSIS — G4733 Obstructive sleep apnea (adult) (pediatric): Secondary | ICD-10-CM | POA: Diagnosis not present

## 2015-02-06 NOTE — Sleep Study (Signed)
Please see the scanned sleep study interpretation located in the Procedure tab within the Chart Review section. 

## 2015-02-10 ENCOUNTER — Telehealth: Payer: Self-pay | Admitting: Neurology

## 2015-02-10 NOTE — Telephone Encounter (Signed)
Patient referred by Dr. Gerarda Fraction, seen by me on 01/14/15, PSG on 02/05/15:  Please call and notify the patient that the recent sleep study did not show any significant obstructive sleep apnea. 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 For this, treatment with CPAP is not warranted. Weight loss and sleeping off the supine position are recommended and may suffice as treatment.  Please inform patient that I would like to go over the details of the study during a follow up appointment and if not already previously scheduled, arrange a followup appointment (please utilize a followu-up slot). Also, route or fax report to PCP and referring MD, if other than PCP.  Once you have spoken to patient, you can close this encounter.   Thanks,  Star Age, MD, PhD [facility] [address])

## 2015-02-11 NOTE — Telephone Encounter (Signed)
I spoke to patient and he is aware of results and we were able to schedule a f/u appt. I will fax report to PCP.

## 2015-02-17 ENCOUNTER — Ambulatory Visit: Payer: Medicare Other | Admitting: Internal Medicine

## 2015-03-04 ENCOUNTER — Ambulatory Visit: Payer: Self-pay | Admitting: Neurology

## 2015-03-04 ENCOUNTER — Encounter: Payer: Self-pay | Admitting: Neurology

## 2015-03-04 ENCOUNTER — Ambulatory Visit (INDEPENDENT_AMBULATORY_CARE_PROVIDER_SITE_OTHER): Payer: Medicare Other | Admitting: Neurology

## 2015-03-04 VITALS — BP 108/62 | HR 70 | Resp 18 | Ht 68.0 in | Wt 195.0 lb

## 2015-03-04 DIAGNOSIS — Z720 Tobacco use: Secondary | ICD-10-CM | POA: Diagnosis not present

## 2015-03-04 DIAGNOSIS — G4733 Obstructive sleep apnea (adult) (pediatric): Secondary | ICD-10-CM

## 2015-03-04 DIAGNOSIS — E663 Overweight: Secondary | ICD-10-CM

## 2015-03-04 DIAGNOSIS — F17200 Nicotine dependence, unspecified, uncomplicated: Secondary | ICD-10-CM

## 2015-03-04 DIAGNOSIS — F419 Anxiety disorder, unspecified: Secondary | ICD-10-CM

## 2015-03-04 NOTE — Patient Instructions (Signed)
Please talk to Dr. Gerarda Fraction about trying another medication for your panic attacks or anxiety disorder, and you could try to gradually come off of your Xanax.   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 For this, I would suggest weight loss, and sleeping on your sides.   I can see you back as needed.   Reduce caffeine intake, increase your water intake, please stop smoking.

## 2015-03-04 NOTE — Progress Notes (Signed)
Subjective:    Patient ID: [name] is a 64 y.o. male.  HPI     Interim history:   [name] is a 64 year old right-handed gentleman with an underlying complicated medical history of COPD, smoking, hyperlipidemia, allergies, anxiety, asthma, depression, reflux disease, IBS, mitral valve regurgitation, Langerhans' cell histiocytosis, chronic low back pain (on hydrocodone 1/2 pill bid on average), hearing loss, coronary artery disease, and obesity, who presents for follow-up consultation of his sleep disorder, after his recent baseline sleep study. The patient is unaccompanied today. I first met him on 01/14/2015 at the request of his primary care physician, at which time the patient reported snoring, a prior diagnosis of OSA about 12 years ago and a family history of obstructive sleep apnea. I invited him back for sleep study. He had a diagnostic sleep study on 02/05/2015 and I went over his test results with him in detail today. Sleep efficiency was reduced at 72.7% with a normal sleep latency of 20.5 minutes and wake after sleep onset of 81 minutes with mild sleep fragmentation noted in the longer period of wakefulness between 2:40 AM and 3:40 AM. He had an increased percentage of stage II sleep, absence of slow-wave sleep and a near normal percentage of REM sleep at 18.8% with a normal REM latency. He had no significant PLMS, EKG or EEG changes. He had mild snoring. Overall AHI was 3.5 per hour, rising to 15 per hour during REM sleep, average oxygen saturation was 92%, nadir was 86%.  Today, 03/04/2015: He reports no significant new symptoms. He has not used CPAP in about a year or so. He does not always wake up rested and is tired during the day. He is wondering whether some of his medications could be contributing. Of note, he takes a beta blocker, Xanax 1 mg twice daily or sometimes 3 times a day as needed and he takes pain medication as needed, usually half a pill up to twice daily. He  has a history of panic attacks and anxiety disorder. Of note, in [id number], his 31 year old son died after a accident and he was on life support for about a week. The patient has another son whom he is very close with. Of note, the patient has not been on any antidepressant and he is worried about potential side effects of antidepressants. He is also worried about being on Xanax long-term and how to come off of it. Of note, he also drinks quite a bit of coffee, 4-6 cups daily.  Previously:  01/14/2015: He was previously diagnosed with obstructive sleep apnea and placed on CPAP therapy. About 8 to 12 months ago, his machine broke. Prior sleep test results are not available for my review today. He was diagnosed with sleep apnea about 12 years ago. His younger half brothers have OSA.  He feels tired during the day. He snores according to his wife and also has pauses in his breathing while asleep. He is trying to quit smoking and currently is smoking half a pack per day. She drinks coffee 2-3 cups in the mornings and no sodas. He does not drink alcohol. He reports that his father was alcoholic and died young. His Epworth sleepiness score is 1 out of 24 today, his fatigue scores 44 out of 63. He has frequent morning headaches, about 3-4 times per week. He denies nocturia, he has restless leg symptoms and reports leg twitching at night and his sleep study apparently also showed leg twitching he recalls. He has  midline low back pain, radiating to the left. He never had back surgery. He tries to use his hydrocodone sparingly. He is retired. He has had nodules on his lung. He saw Dr. Lake Bells in pulmonology about a year ago and is supposed to go back this year.  His Past Medical History Is Significant For: Past Medical History  Diagnosis Date  . Hypertension   . COPD (chronic obstructive pulmonary disease) ([place])   . Dyslipidemia   . Allergic rhinitis   . Asthma   . Anxiety and depression   . GERD  (gastroesophageal reflux disease)   . IBS (irritable bowel syndrome)   . Mitral regurgitation   . Langerhan's cell histiocytosis ([place])   . Chronic low back pain   . Hearing loss   . Hyperplastic colon polyp 12/28/10  . Tubular adenoma 12/28/10  . Hemorrhoids   . OSA on CPAP   . CAD (coronary artery disease)     mild to mod - LAD lesion in 2006  . History of nuclear stress test 08/2009    bruce protocol; negative for ischemia  . Easy fatigability     His Past Surgical History Is Significant For: Past Surgical History  Procedure Laterality Date  . Inguinal hernia repair      left  . Ear pinna reconstruction w/ rib graft      right  . Cholecystectomy    . Tonsillectomy    . Foot surgery      right  . Lung biopsy  2009    right  . Colonoscopy  2007    friable anal canal, hyperplastic polyp  . Esophagogastroduodenoscopy      multiple dilations, last EGD 2007 showed small hh, adenomatous appearing gastric mucosa in body but biopsies benign. SB bx negative for Celiac.  Marland Kitchen Esophagogastroduodenoscopy  12/28/2010    Dr. Gala Romney- normal esophagus s/p dilationpatchy erythema and erosions.  Venia Minks dilation  12/28/2010    Procedure: Venia Minks DILATION;  Surgeon: Daneil Dolin, MD;  [facility];  Service: Endoscopy;  Laterality: N/A;  . Colonoscopy  12/28/2010    Dr. Gala Romney- tubular adenoma, hyperplastic polyp  . Transthoracic echocardiogram  02/2011    EF =>55%, normal chamber size & function; mild mitral and aortic insuff, mild-mod tricuspid insuff; mild pulm htn with rsvp of 79mHg  . Colonoscopy N/A 01/16/2015    Procedure: COLONOSCOPY;  Surgeon: RDaneil Dolin MD;  [facility];  Service: Endoscopy;  Laterality: N/A;  1330    His Family History Is Significant For: Family History  Problem Relation Age of Onset  . Colon cancer Neg Hx   . Liver disease Neg Hx   . Inflammatory bowel disease Neg Hx   . Heart disease Father 433   etoh/breathing problmes  . GI problems  Mother   . Hyperlipidemia Mother   . Heart attack Maternal Grandmother     also valvular problems  . Heart attack Maternal Grandfather   . Diabetes Paternal Grandmother   . Heart Problems Paternal Grandfather   . Heart attack Brother     stenting x3, also cancer  . Skin cancer Brother   . Hypertension Brother   . Hypertension Sister   . COPD Mother   . COPD Father   . COPD Paternal Grandmother     His Social History Is Significant For: Social History   Social History  . Marital Status: Married    Spouse Name: N/A  . Number of Children: 1  .  Years of Education: N/A   Occupational History  . disability    Social History Main Topics  . Smoking status: Current Every Day Smoker -- 0.50 packs/day for 44 years    Types: Cigarettes  . Smokeless tobacco: Never Used     Comment: 1/2 pack daily  . Alcohol Use: No  . Drug Use: No  . Sexual Activity: Not Asked   Other Topics Concern  . None   Social History Narrative   Son, age 44, hit by truck.   Drinks about 2-3 cups of coffee a day. Occasionally drinks tea.     His Allergies Are:  Allergies  Allergen Reactions  . Bee Venom Swelling  :   His Current Medications Are:  Outpatient Encounter Prescriptions as of 03/04/2015  Medication Sig  . albuterol (PROVENTIL HFA;VENTOLIN HFA) 108 (90 BASE) MCG/ACT inhaler Inhale 2 puffs into the lungs every 6 (six) hours as needed for wheezing.  Marland Kitchen ALPRAZolam (XANAX) 1 MG tablet Take 1 mg by mouth at bedtime as needed.    Marland Kitchen aspirin 81 MG tablet Take 81 mg by mouth daily.    . fenofibrate (TRICOR) 145 MG tablet Take 145 mg by mouth daily.    . fish oil-omega-3 fatty acids 1000 MG capsule Take 2 g by mouth daily.    Marland Kitchen glucosamine-chondroitin 500-400 MG tablet Take 1 tablet by mouth 2 (two) times daily.  Marland Kitchen HYDROcodone-acetaminophen (NORCO) 10-325 MG per tablet Take 1 tablet by mouth every 6 (six) hours as needed for pain.  . metoprolol tartrate (LOPRESSOR) 25 MG tablet Take 1 tablet (25  mg total) by mouth 2 (two) times daily.  . Multiple Vitamin (MULTIVITAMIN) capsule Take 1 capsule by mouth daily.    . nitroGLYCERIN (NITROSTAT) 0.4 MG SL tablet Place 1 tablet (0.4 mg total) under the tongue every 5 (five) minutes as needed for chest pain.  . polyethylene glycol-electrolytes (NULYTELY/GOLYTELY) 420 G solution Take 4,000 mLs by mouth once.  . simvastatin (ZOCOR) 20 MG tablet Take 20 mg by mouth at bedtime.    Marland Kitchen tiotropium (SPIRIVA) 18 MCG inhalation capsule Place 1 capsule (18 mcg total) into inhaler and inhale daily.  . valsartan (DIOVAN) 160 MG tablet Take 80 mg by mouth daily.   Marland Kitchen omeprazole (PRILOSEC) 20 MG capsule Take 1 capsule (20 mg total) by mouth 2 (two) times daily.   No facility-administered encounter medications on file as of 03/04/2015.  :  Review of Systems:  Out of a complete 14 point review of systems, all are reviewed and negative with the exception of these symptoms as listed below:   Review of Systems  Neurological:       Patient is here to discuss sleep study. No new concerns.     Objective:  Neurologic Exam  Physical Exam Physical Examination:   Filed Vitals:   03/04/15 0937  BP: 108/62  Pulse: 70  Resp: 18    General Examination: The patient is a very pleasant 64 y.o. male in no acute distress. He appears well-developed and well-nourished and adequately groomed. He is in good spirits today.  HEENT: Normocephalic, atraumatic, pupils are equal, round and reactive to light and accommodation.  Extraocular tracking is good without limitation to gaze excursion or nystagmus noted. Normal smooth pursuit is noted. Hearing is grossly intact. Face is symmetric with normal facial animation and normal facial sensation. Speech is clear with no dysarthria noted. There is no hypophonia. There is no lip, neck/head, jaw or voice tremor. Neck is supple  with full range of passive and active motion. There are no carotid bruits on auscultation. Oropharynx exam  reveals: moderate mouth dryness, adequate dental hygiene and moderate airway crowding, due to wider tongue, wider uvula and redundant soft palate. Mallampati is class II. Tongue protrudes centrally and palate elevates symmetrically. Tonsils are absent.   Chest: Clear to auscultation without wheezing, rhonchi or crackles noted.  Heart: S1+S2+0, regular and normal without murmurs, rubs or gallops noted.   Abdomen: Soft, non-tender and non-distended with normal bowel sounds appreciated on auscultation.  Extremities: There is no pitting edema in the distal lower extremities bilaterally. Pedal pulses are intact.  Skin: Warm and dry without trophic changes noted. There are no varicose veins.  Musculoskeletal: exam reveals no obvious joint deformities, tenderness or joint swelling or erythema.   Neurologically:  Mental status: The patient is awake, alert and oriented in all 4 spheres. His immediate and remote memory, attention, language skills and fund of knowledge are appropriate. There is no evidence of aphasia, agnosia, apraxia or anomia. Speech is clear with normal prosody and enunciation. Thought process is linear. Mood is normal and affect is normal.  Cranial nerves II - XII are as described above under HEENT exam. In addition: shoulder shrug is normal with equal shoulder height noted. Motor exam: Normal bulk, strength and tone is noted. There is no drift, tremor or rebound. Romberg is negative. Reflexes are 2+ throughout. Fine motor skills and coordination: intact.  Sensory exam: intact to light touch in the upper and lower extremities.  Gait, station and balance: He stands with difficulty. No veering to one side is noted. No leaning to one side is noted. Posture is age-appropriate and stance is narrow based. Gait shows normal stride length and normal pace. No problems turning are noted. He turns en bloc. Tandem walk is better today.   Assessment and Plan:  In summary, DEMITRIUS CRASS is a  very pleasant 64 year old male with an underlying complicated medical history of COPD, smoking, hyperlipidemia, allergies, anxiety, asthma, depression, reflux disease, IBS, mitral valve regurgitation, Langerhans' cell histiocytosis, chronic low back pain (hydrocodone 10/325 mg, up to 1 qid, but he take 1/2 pill bid on average), hearing loss, coronary artery disease, and obesity, who was previously diagnosed with obstructive sleep apnea and placed on CPAP therapy. His original sleep apnea diagnosis was about 12 years ago and a year ago his CPAP machine broke. He had a recent diagnostic sleep study on 02/05/2015 which we discussed. Thankfully this did not show any significant obstructive sleep disordered breathing, with an overall AHI of 3.5 per hour, but he did have mild REM related OSA with a REM AHI of 15/h. For this, treatment with CPAP as not warranted or medically imperative. I advised him to try to lose weight and sleep on his side is much is possible. His daytime tiredness could be in part secondary to potentially sedating medications including Norco, Lopressor, and Xanax. He is advised to talk to his primary care physician about potentially starting an antidepressant with antianxiety properties and gradually taper down or taper off his Xanax. He has been on it for about 18 years and I explained to him that he should never stop Xanax abruptly because he can go into withdrawals and even seizures. Furthermore, he is advised that excess caffeine can drive anxiety symptoms. He is encouraged to try to reduce his caffeine intake and increase his water intake. He is also advised to try to quit smoking.  From my end  of things, I suggested I see him back on an as needed basis. He does not require treatment for obstructive sleep apnea at this time. I answered all his questions today and he was in agreement. I spent 18 minutes in total face-to-face time with the patient, more than 50% of which was spent in counseling  and coordination of care, reviewing test results, reviewing medication and discussing or reviewing the diagnosis of OSA, anxiety d/o, the prognosis and treatment options.

## 2015-04-15 ENCOUNTER — Encounter: Payer: Self-pay | Admitting: Internal Medicine

## 2015-04-15 ENCOUNTER — Ambulatory Visit (INDEPENDENT_AMBULATORY_CARE_PROVIDER_SITE_OTHER): Payer: Medicare Other | Admitting: Internal Medicine

## 2015-04-15 VITALS — BP 122/78 | HR 56 | Ht 67.0 in | Wt 197.0 lb

## 2015-04-15 DIAGNOSIS — J438 Other emphysema: Secondary | ICD-10-CM | POA: Diagnosis not present

## 2015-04-15 DIAGNOSIS — R0602 Shortness of breath: Secondary | ICD-10-CM

## 2015-04-15 DIAGNOSIS — R079 Chest pain, unspecified: Secondary | ICD-10-CM | POA: Diagnosis not present

## 2015-04-15 DIAGNOSIS — I10 Essential (primary) hypertension: Secondary | ICD-10-CM

## 2015-04-15 DIAGNOSIS — F17200 Nicotine dependence, unspecified, uncomplicated: Secondary | ICD-10-CM

## 2015-04-15 NOTE — Progress Notes (Signed)
OFFICE NOTE  Chief Complaint:  Exertional chest pain and worsening shortness of breath  Primary Care Physician: Glo Herring., MD  HPI:  [name]  is a 64 year old gentleman with a history of hypertension, depression, COPD, sleep apnea, GERD and dyslipidemia. He also has mild to moderate coronary disease with a 40% proximal to mid-LAD lesion in 2006. He has some mild aortic insufficiency on echocardiogram, and that was repeated in October of 2012 and showed a preserved LVEF of greater than 55%, normal chamber size and function, mild mitral and aortic insufficiency, and mild to moderate tricuspid insufficiency. There was also mild pulmonary arterial hypertension, RVSP of 34 mmHg which is actually borderline. He recently underwent right knee surgery and has been recovering from this. He did seem to tolerate the surgery well and it was under general anesthesia. However since surgery he has noted some increasing chest tightness and pain. The symptoms are mostly substernal and radiates to the left chest and occasionally to the left arm. It seemed to occur more with exertion but can occur at rest. He does not note any change when using his inhaler. He does report occasional wheezing still related to his COPD, which is worse at night and does improve with his inhaler. His chest pain is increasing with intensity and seems to be coming more frequently.  Unfortunately continues to smoke and is not yet ready to quit because he is around too many people that smoke.  He is referred for a nuclear stress test which are performed on 03/06/2013. This study was negative for ischemia. He continues to report some shortness of breath with exertion but not as significant. He has had a number of episodes of shortness of breath which I believe are exacerbations of chronic bronchitis and COPD. He only uses her rescue inhaler but fairly frequently. He continues to smoke but has cut back some. I referred him to  see Dr. Lake Bells who diagnosed him with COPD stage C, he has been started on Spiriva with an improvement in his shortness of breath. He also has a albuterol inhaler to use as needed. Generally he feels is doing well.  [name] presents today for follow-up. Since I last saw him, he underwent a sleep study which was mildly abnormal although CPAP was not recommended. He tells me that he's been having some worsening chest discomfort and more significantly short of breath when doing activities such as walking up stairs or normal chores. He says his symptoms improved fairly quickly with resting. He denies any worsening productive cough or sputum production. He's been a little bit wheezy. He continues to use his inhalers as prescribed by his pulmonologist. His last stress test was 2 years ago and negative for ischemia. EKG today shows sinus bradycardia at 56 without ischemic changes.  PMHx:  Past Medical History  Diagnosis Date  . Hypertension   . COPD (chronic obstructive pulmonary disease) ([place])   . Dyslipidemia   . Allergic rhinitis   . Asthma   . Anxiety and depression   . GERD (gastroesophageal reflux disease)   . IBS (irritable bowel syndrome)   . Mitral regurgitation   . Langerhan's cell histiocytosis ([place])   . Chronic low back pain   . Hearing loss   . Hyperplastic colon polyp 12/28/10  . Tubular adenoma 12/28/10  . Hemorrhoids   . OSA on CPAP   . CAD (coronary artery disease)     mild to mod - LAD lesion in 2006  .  History of nuclear stress test 08/2009    bruce protocol; negative for ischemia  . Easy fatigability     Past Surgical History  Procedure Laterality Date  . Inguinal hernia repair      left  . Ear pinna reconstruction w/ rib graft      right  . Cholecystectomy    . Tonsillectomy    . Foot surgery      right  . Lung biopsy  2009    right  . Colonoscopy  2007    friable anal canal, hyperplastic polyp  . Esophagogastroduodenoscopy      multiple dilations, last EGD  2007 showed small hh, adenomatous appearing gastric mucosa in body but biopsies benign. SB bx negative for Celiac.  Marland Kitchen Esophagogastroduodenoscopy  12/28/2010    Dr. Gala Romney- normal esophagus s/p dilationpatchy erythema and erosions.  Venia Minks dilation  12/28/2010    Procedure: Venia Minks DILATION;  Surgeon: Daneil Dolin, MD;  [facility];  Service: Endoscopy;  Laterality: N/A;  . Colonoscopy  12/28/2010    Dr. Gala Romney- tubular adenoma, hyperplastic polyp  . Transthoracic echocardiogram  02/2011    EF =>55%, normal chamber size & function; mild mitral and aortic insuff, mild-mod tricuspid insuff; mild pulm htn with rsvp of 43mmHg  . Colonoscopy N/A 01/16/2015    Procedure: COLONOSCOPY;  Surgeon: Daneil Dolin, MD;  [facility];  Service: Endoscopy;  Laterality: N/A;  1330    FAMHx:  Family History  Problem Relation Age of Onset  . Colon cancer Neg Hx   . Liver disease Neg Hx   . Inflammatory bowel disease Neg Hx   . Heart disease Father 71    etoh/breathing problmes  . GI problems Mother   . Hyperlipidemia Mother   . Heart attack Maternal Grandmother     also valvular problems  . Heart attack Maternal Grandfather   . Diabetes Paternal Grandmother   . Heart Problems Paternal Grandfather   . Heart attack Brother     stenting x3, also cancer  . Skin cancer Brother   . Hypertension Brother   . Hypertension Sister   . COPD Mother   . COPD Father   . COPD Paternal Grandmother     SOCHx:   reports that he has been smoking Cigarettes.  He has a 22 pack-year smoking history. He has never used smokeless tobacco. He reports that he does not drink alcohol or use illicit drugs.  ALLERGIES:  Allergies  Allergen Reactions  . Bee Venom Swelling    ROS: A comprehensive review of systems was negative except for: Respiratory: positive for chronic bronchitis, dyspnea on exertion and wheezing Cardiovascular: positive for chest pain  HOME MEDS: Current Outpatient  Prescriptions  Medication Sig Dispense Refill  . albuterol (PROVENTIL HFA;VENTOLIN HFA) 108 (90 BASE) MCG/ACT inhaler Inhale 2 puffs into the lungs every 6 (six) hours as needed for wheezing.    Marland Kitchen ALPRAZolam (XANAX) 1 MG tablet Take 1 mg by mouth at bedtime as needed.      Marland Kitchen aspirin 81 MG tablet Take 81 mg by mouth daily.      . fenofibrate (TRICOR) 145 MG tablet Take 145 mg by mouth daily.      . fish oil-omega-3 fatty acids 1000 MG capsule Take 2 g by mouth daily.      Marland Kitchen glucosamine-chondroitin 500-400 MG tablet Take 1 tablet by mouth 2 (two) times daily.    . hydrochlorothiazide (HYDRODIURIL) 12.5 MG tablet Take 12.5 mg  by mouth daily.    Marland Kitchen HYDROcodone-acetaminophen (NORCO) 10-325 MG per tablet Take 1 tablet by mouth every 6 (six) hours as needed for pain.    . metoprolol tartrate (LOPRESSOR) 25 MG tablet Take 1 tablet (25 mg total) by mouth 2 (two) times daily. 180 tablet 3  . Multiple Vitamin (MULTIVITAMIN) capsule Take 1 capsule by mouth daily.      . nitroGLYCERIN (NITROSTAT) 0.4 MG SL tablet Place 1 tablet (0.4 mg total) under the tongue every 5 (five) minutes as needed for chest pain. 25 tablet 3  . polyethylene glycol-electrolytes (NULYTELY/GOLYTELY) 420 G solution Take 4,000 mLs by mouth once. 4000 mL 0  . simvastatin (ZOCOR) 20 MG tablet Take 20 mg by mouth at bedtime.      Marland Kitchen tiotropium (SPIRIVA) 18 MCG inhalation capsule Place 1 capsule (18 mcg total) into inhaler and inhale daily. 30 capsule 5  . valsartan (DIOVAN) 160 MG tablet Take 80 mg by mouth daily.     Marland Kitchen omeprazole (PRILOSEC) 20 MG capsule Take 1 capsule (20 mg total) by mouth 2 (two) times daily. 60 capsule 0   No current facility-administered medications for this visit.    LABS/IMAGING: No results found for this or any previous visit (from the past 48 hour(s)). No results found.  VITALS: BP 122/78 mmHg  Pulse 56  Ht 5\' 7"  (1.702 m)  Wt 197 lb (89.359 kg)  BMI 30.85 kg/m2  EXAM: General appearance: alert and no  distress Neck: no adenopathy, no carotid bruit, no JVD, supple, symmetrical, trachea midline and thyroid not enlarged, symmetric, no tenderness/mass/nodules Lungs: poor airflow, consolidation in the left base with E/A changes, upper airway end-expiratory wheezes, rhonchi Heart: regular rate and rhythm, S1, S2 normal, systolic murmur: early systolic 2/6, crescendo at apex and diastolic murmur: mid diastolic 2/6, blowing at 2nd right intercostal space Abdomen: soft, non-tender; bowel sounds normal; no masses,  no organomegaly Extremities: extremities normal, atraumatic, no cyanosis or edema Pulses: 2+ and symmetric Skin: Skin color, texture, turgor normal. No rashes or lesions Neurologic: Grossly normal Psych: Does not appear anxious  EKG: Sinus bradycardia 56  ASSESSMENT: 1. Exertional chest pain and progressive dyspnea on exertion 2. Known coronary artery disease with a 40% LAD stenosis in 2006 3. Ongoing tobacco abuse 4. COPD  5. Mild aortic and mitral insufficiency - no change in murmur intensity 6. Dyslipidemia - recently checked by PCP and at goal 7. Hypertension - at goal on current hypertensives, no changes 8. Obstructive sleep apnea on CPAP   PLAN: 1.   Mr. Arneson has been having some progressive shortness of breath and chest discomfort. It's very difficult to discern whether this is related to COPD or possibly angina. His EKG at rest looks nonischemic. He has known 40% LAD stenosis by cath in the past and had a negative stress test 2 years ago. I like to repeat a Lexiscan Myoview to reassess for any new perfusion defects. His murmur sounds stable. I'd also like to get him back to his pulmonologist for reassessment of his progressive shortness of breath.  Plan to see him back after testing for follow-up.  Pixie Casino, MD, [facility] Attending Cardiologist [facility]  Pixie Casino 04/15/2015, 1:10 PM

## 2015-04-15 NOTE — Patient Instructions (Signed)
Your physician has requested that you have a lexiscan myoview. For further information please visit [email or web address]. Please follow instruction sheet, as given.  Your physician recommends that you schedule a follow-up appointment after your test.   Please contact [facility] (Dr. Roselie Awkward) to schedule an appointment Their office number is [phone]

## 2015-04-22 ENCOUNTER — Ambulatory Visit: Payer: Medicare Other | Admitting: Internal Medicine

## 2015-04-23 ENCOUNTER — Telehealth (HOSPITAL_COMMUNITY): Payer: Self-pay

## 2015-04-23 NOTE — Telephone Encounter (Signed)
Encounter complete. 

## 2015-04-27 ENCOUNTER — Telehealth: Payer: Self-pay

## 2015-04-27 NOTE — Telephone Encounter (Signed)
A,    Can we try to get him scheduled first available?     Thanks    Ruby Cola    Next available with BQ is 07/06/15.  Ok to schedule that far out, or double book, or schedule with TP?  Thanks!

## 2015-04-28 ENCOUNTER — Ambulatory Visit (HOSPITAL_COMMUNITY)
Admission: RE | Admit: 2015-04-28 | Discharge: 2015-04-28 | Disposition: A | Discharge status: Home / Self Care | Payer: Medicare Other | Source: Ambulatory Visit | Attending: Cardiology | Admitting: Cardiology

## 2015-04-28 DIAGNOSIS — Z8249 Family history of ischemic heart disease and other diseases of the circulatory system: Secondary | ICD-10-CM | POA: Diagnosis not present

## 2015-04-28 DIAGNOSIS — R42 Dizziness and giddiness: Secondary | ICD-10-CM | POA: Insufficient documentation

## 2015-04-28 DIAGNOSIS — R0609 Other forms of dyspnea: Secondary | ICD-10-CM | POA: Insufficient documentation

## 2015-04-28 DIAGNOSIS — G4733 Obstructive sleep apnea (adult) (pediatric): Secondary | ICD-10-CM | POA: Insufficient documentation

## 2015-04-28 DIAGNOSIS — R0602 Shortness of breath: Secondary | ICD-10-CM

## 2015-04-28 DIAGNOSIS — I10 Essential (primary) hypertension: Secondary | ICD-10-CM | POA: Insufficient documentation

## 2015-04-28 DIAGNOSIS — R079 Chest pain, unspecified: Secondary | ICD-10-CM | POA: Insufficient documentation

## 2015-04-28 DIAGNOSIS — F17200 Nicotine dependence, unspecified, uncomplicated: Secondary | ICD-10-CM | POA: Insufficient documentation

## 2015-04-28 LAB — MYOCARDIAL PERFUSION IMAGING
CHL CUP RESTING HR STRESS: 57 {beats}/min
CSEPPHR: 78 {beats}/min
LVDIAVOL: 106 mL
LVSYSVOL: 39 mL
SDS: 0
SRS: 0
SSS: 0
TID: 1.09

## 2015-04-28 MED ORDER — REGADENOSON 0.4 MG/5ML IV SOLN
0.4000 mg | Freq: Once | INTRAVENOUS | Status: AC
  Administered 2015-04-28: 0.4 mg via INTRAVENOUS

## 2015-04-28 MED ORDER — AMINOPHYLLINE 25 MG/ML IV SOLN
75.0000 mg | Freq: Once | INTRAVENOUS | Status: AC
  Administered 2015-04-28: 75 mg via INTRAVENOUS

## 2015-04-28 MED ORDER — TECHNETIUM TC 99M SESTAMIBI GENERIC - CARDIOLITE
32.3000 | Freq: Once | INTRAVENOUS | Status: AC | PRN
  Administered 2015-04-28: 32.3 via INTRAVENOUS

## 2015-04-28 MED ORDER — TECHNETIUM TC 99M SESTAMIBI GENERIC - CARDIOLITE
10.8000 | Freq: Once | INTRAVENOUS | Status: AC | PRN
  Administered 2015-04-28: 11 via INTRAVENOUS

## 2015-04-28 NOTE — Telephone Encounter (Signed)
TP 

## 2015-04-28 NOTE — Telephone Encounter (Signed)
lmtcb x1 for pt. 

## 2015-04-28 NOTE — Telephone Encounter (Signed)
Pt scheduled for OV with TP 05/08/15 at 4:15 Nothing further needed.

## 2015-05-08 ENCOUNTER — Ambulatory Visit: Payer: Medicare Other | Admitting: Adult Health

## 2015-05-08 ENCOUNTER — Ambulatory Visit (INDEPENDENT_AMBULATORY_CARE_PROVIDER_SITE_OTHER): Payer: Medicare Other | Admitting: Adult Health

## 2015-05-08 ENCOUNTER — Encounter: Payer: Self-pay | Admitting: Adult Health

## 2015-05-08 VITALS — BP 120/80 | HR 72 | Temp 98.3°F | Ht 68.0 in | Wt 198.4 lb

## 2015-05-08 DIAGNOSIS — F17200 Nicotine dependence, unspecified, uncomplicated: Secondary | ICD-10-CM | POA: Diagnosis not present

## 2015-05-08 DIAGNOSIS — J449 Chronic obstructive pulmonary disease, unspecified: Secondary | ICD-10-CM | POA: Diagnosis not present

## 2015-05-08 NOTE — Progress Notes (Signed)
Subjective:    Patient ID: [name], male    DOB: 1951-02-14, 64 y.o.   MRN: [id number]  HPI 64 year old male, active smoker, with COPD, Gold C  05/08/2015 Follow up : COPD , smoker  Patient returns for one-year follow-up. Last seen in the office June 2015 with Dr. Lake Bells.  Patient says overall his breathing is doing okay. He does get short of breath with activity. At times. He denies any increased cough. Last chest x-ray was in June 2016 showing COPD changes Patient does continue to smoke, smoking cessation was discussed. Patient had a low dose screening CT chest in July 2015 with a benign appearance. We discussed a repeat screening. He would like to think about this as his insurance did not cover this last time. I explained that I would look into this.  He remains on Spiriva daily. Previously, he was tried on BREO but did not feel like that this was any benefit.  He denies any chest pain, hemoptysis, orthopnea, PND, or increased leg swelling.   Past Medical History  Diagnosis Date  . Hypertension   . COPD (chronic obstructive pulmonary disease) ([place])   . Dyslipidemia   . Allergic rhinitis   . Asthma   . Anxiety and depression   . GERD (gastroesophageal reflux disease)   . IBS (irritable bowel syndrome)   . Mitral regurgitation   . 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 Chronic low back pain   . Hearing loss   . Hyperplastic colon polyp 12/28/10  . Tubular adenoma 12/28/10  . Hemorrhoids   . OSA on CPAP   . CAD (coronary artery disease)     mild to mod - LAD lesion in 2006  . History of nuclear stress test 08/2009    bruce protocol; negative for ischemia  . Easy fatigability       Review of Systems Constitutional:   No  weight loss, night sweats,  Fevers, chills, fatigue, or  lassitude.  HEENT:   No headaches,  Difficulty swallowing,  Tooth/dental problems, or  Sore throat,                No sneezing, itching, ear ache, nasal congestion, post nasal drip,    CV:  No chest pain,  Orthopnea, PND, swelling in lower extremities, anasarca, dizziness, palpitations, syncope.   GI  No heartburn, indigestion, abdominal pain, nausea, vomiting, diarrhea, change in bowel habits, loss of appetite, bloody stools.   Resp:   No chest wall deformity  Skin: no rash or lesions.  GU: no dysuria, change in color of urine, no urgency or frequency.  No flank pain, no hematuria   MS:  No joint pain or swelling.  No decreased range of motion.  No back pain.  Psych:  No change in mood or affect. No depression or anxiety.  No memory loss.         Objective:   Physical Exam  Filed Vitals:   05/08/15 0844  BP: 120/80  Pulse: 72  Temp: 98.3 F (36.8 C)  TempSrc: Oral  Height: 5\' 8"  (1.727 m)  Weight: 198 lb 6.4 oz (89.994 kg)  SpO2: 98%    GEN: A/Ox3; pleasant , NAD, elderly   HEENT:  /AT,  EACs-clear, TMs-wnl, NOSE-clear, THROAT-clear, no lesions, no postnasal drip or exudate noted.   NECK:  Supple w/ fair ROM; no JVD; normal carotid impulses w/o bruits; no thyromegaly or nodules palpated; no lymphadenopathy.  RESP  Decreased BS in bases ,  no accessory muscle use, no dullness to percussion  CARD:  RRR, no m/r/g  , no peripheral edema, pulses intact, no cyanosis or clubbing.  GI:   Soft & nt; nml bowel sounds; no organomegaly or masses detected.  Musco: Warm bil, no deformities or joint swelling noted.   Neuro: alert, no focal deficits noted.    Skin: Warm, no lesions or rashes         Assessment & Plan:

## 2015-05-08 NOTE — Assessment & Plan Note (Signed)
Continue on quitting  Smoking.  Call 1-800-QUIT-NOW We will see you back in 6 months and As needed   I will be in touch regarding low dose screening CT chest . -will discuss with Eric Form [facility] about coverage

## 2015-05-08 NOTE — Assessment & Plan Note (Signed)
Compensated without flare. Encouraged on smoking cessation  Plan Continue on quitting  Smoking.  Call 1-800-QUIT-NOW Use Spiriva daily no matter how you feel Exercise regularly We will see you back in 6 months and As needed   I will be in touch regarding low dose screening CT chest .

## 2015-05-08 NOTE — Patient Instructions (Signed)
Continue on quitting  Smoking.  Call 1-800-QUIT-NOW Use Spiriva daily no matter how you feel Exercise regularly We will see you back in 6 months and As needed   I will be in touch regarding low dose screening CT chest .

## 2015-05-11 NOTE — Progress Notes (Signed)
Reviewed, I agree with this plan of care 

## 2015-05-12 ENCOUNTER — Other Ambulatory Visit: Payer: Self-pay | Admitting: Acute Care

## 2015-05-12 DIAGNOSIS — R918 Other nonspecific abnormal finding of lung field: Secondary | ICD-10-CM

## 2015-06-02 ENCOUNTER — Ambulatory Visit (INDEPENDENT_AMBULATORY_CARE_PROVIDER_SITE_OTHER)
Admission: RE | Admit: 2015-06-02 | Discharge: 2015-06-02 | Disposition: A | Discharge status: Home / Self Care | Payer: Medicare Other | Source: Ambulatory Visit | Attending: Acute Care | Admitting: Acute Care

## 2015-06-02 ENCOUNTER — Encounter: Payer: Self-pay | Admitting: Acute Care

## 2015-06-02 ENCOUNTER — Ambulatory Visit (INDEPENDENT_AMBULATORY_CARE_PROVIDER_SITE_OTHER): Payer: Medicare Other | Admitting: Acute Care

## 2015-06-02 DIAGNOSIS — F17210 Nicotine dependence, cigarettes, uncomplicated: Secondary | ICD-10-CM | POA: Diagnosis not present

## 2015-06-02 DIAGNOSIS — R918 Other nonspecific abnormal finding of lung field: Secondary | ICD-10-CM

## 2015-06-02 NOTE — Progress Notes (Signed)
Shared Decision Making Visit Lung Cancer Screening Program [phone])   Eligibility:  Age 65 y.o.  Pack Years Smoking History Calculation 46 pack year smoking history (# packs/per year x # years smoked)  Recent History of coughing up blood : No  Unexplained weight loss? no ( >Than 15 pounds within the last 6 months )  Prior History Lung / other cancer no (Diagnosis within the last 5 years already requiring surveillance chest CT Scans).  Smoking Status Current Smoker  Former Smokers: Years since quit: NA  Quit Date: NA  Visit Components:  Discussion included one or more decision making aids. yes  Discussion included risk/benefits of screening. yes  Discussion included potential follow up diagnostic testing for abnormal scans. yes  Discussion included meaning and risk of over diagnosis. yes  Discussion included meaning and risk of False Positives. yes  Discussion included meaning of total radiation exposure. yes  Counseling Included:  Importance of adherence to annual lung cancer LDCT screening. yes  Impact of comorbidities on ability to participate in the program. yes  Ability and willingness to under diagnostic treatment. yes  Smoking Cessation Counseling:  Current Smokers:   Discussed importance of smoking cessation. yes  Information about tobacco cessation classes and interventions provided to patient. yes  Patient provided with "ticket" for LDCT Scan. yes  Symptomatic Patient. no  Counseling : asymptomatic  Diagnosis Code: Tobacco Use Z72.0  Asymptomatic Patient yes  Counseling (Intermediate counseling: > three minutes counseling) [id number]  Former Smokers:   Discussed the importance of maintaining cigarette abstinence. NA; pt is a current smoker  Diagnosis Code: Personal History of Nicotine Dependence. [id number]  Information about tobacco cessation classes and interventions provided to patient. Yes  Patient provided with "ticket" for LDCT Scan.  yes  Written Order for Lung Cancer Screening with LDCT placed in Epic. Yes (CT Chest Lung Cancer Screening Low Dose W/O CM) [id number] Z12.2-Screening of respiratory organs Z87.891-Personal history of nicotine dependence   I have spent 15 minutes of face to face time with Mr. Muska discussing the risks and benefits of lung cancer screening. We viewed a power point together that explained in detail the above noted topics. We paused at intervals to allow for questions to be asked and answered to ensure understanding.We discussed that the single most powerful action that he can take to decrease his  risk of developing lung cancer is to quit smoking. We discussed whether or not he is ready to commit to setting a quit date. He is not ready to set a quit date at this time. We discussed options for tools to aid in quitting smoking including nicotine replacement therapy, non-nicotine medications, support groups, Quit Smart classes, and behavior modification. We discussed that often times setting smaller, more achievable goals, such as eliminating 1 cigarette a day for a week and then 2 cigarettes a day for a week can be helpful in slowly decreasing the number of cigarettes smoked. This allows for a sense of accomplishment as well as providing a clinical benefit. I gave Mr. Genovese the " Be Stronger Than Your Excuses" card with contact information for community resources, classes, free nicotine replacement therapy, and access to mobile apps, text messaging, and on-line smoking cessation help. I have also given him my card and contact information in the event he needs to contact me. We discussed the time and location of the scan, and that either June Leap, CMA, or I will call with the results within 24-48 hours of receiving them. I have  provided him with a copy of the power point we viewed  as a resource in the event they need reinforcement of the concepts we discussed today in the office. The patient verbalized  understanding of all of  the above and had no further questions upon leaving the office. They have my contact information in the event they have any further questions.  Magdalen Spatz, NP

## 2015-06-19 ENCOUNTER — Encounter: Payer: Self-pay | Admitting: Internal Medicine

## 2015-06-19 ENCOUNTER — Ambulatory Visit (INDEPENDENT_AMBULATORY_CARE_PROVIDER_SITE_OTHER): Payer: Medicare Other | Admitting: Internal Medicine

## 2015-06-19 VITALS — BP 112/70 | HR 76 | Ht 68.0 in | Wt 200.2 lb

## 2015-06-19 DIAGNOSIS — J438 Other emphysema: Secondary | ICD-10-CM | POA: Diagnosis not present

## 2015-06-19 DIAGNOSIS — I080 Rheumatic disorders of both mitral and aortic valves: Secondary | ICD-10-CM

## 2015-06-19 DIAGNOSIS — I2510 Atherosclerotic heart disease of native coronary artery without angina pectoris: Secondary | ICD-10-CM | POA: Insufficient documentation

## 2015-06-19 DIAGNOSIS — I25118 Atherosclerotic heart disease of native coronary artery with other forms of angina pectoris: Secondary | ICD-10-CM | POA: Diagnosis not present

## 2015-06-19 MED ORDER — SIMVASTATIN 20 MG PO TABS: 20.0000 mg | ORAL_TABLET | Freq: Every day | ORAL | Status: DC

## 2015-06-19 NOTE — Patient Instructions (Signed)
Dr Hilty recommends that you schedule a follow-up appointment in 1 year. You will receive a reminder letter in the mail two months in advance. If you don't receive a letter, please call our office to schedule the follow-up appointment.  If you need a refill on your cardiac medications before your next appointment, please call your pharmacy. 

## 2015-06-19 NOTE — Progress Notes (Signed)
OFFICE NOTE  Chief Complaint:  Follow-up stress test  Primary Care Physician: Glo Herring., MD  HPI:  [name]  is a 65 year old gentleman with a history of hypertension, depression, COPD, sleep apnea, GERD and dyslipidemia. He also has mild to moderate coronary disease with a 40% proximal to mid-LAD lesion in 2006. He has some mild aortic insufficiency on echocardiogram, and that was repeated in October of 2012 and showed a preserved LVEF of greater than 55%, normal chamber size and function, mild mitral and aortic insufficiency, and mild to moderate tricuspid insufficiency. There was also mild pulmonary arterial hypertension, RVSP of 34 mmHg which is actually borderline. He recently underwent right knee surgery and has been recovering from this. He did seem to tolerate the surgery well and it was under general anesthesia. However since surgery he has noted some increasing chest tightness and pain. The symptoms are mostly substernal and radiates to the left chest and occasionally to the left arm. It seemed to occur more with exertion but can occur at rest. He does not note any change when using his inhaler. He does report occasional wheezing still related to his COPD, which is worse at night and does improve with his inhaler. His chest pain is increasing with intensity and seems to be coming more frequently.  Unfortunately continues to smoke and is not yet ready to quit because he is around too many people that smoke.  He is referred for a nuclear stress test which are performed on 03/06/2013. This study was negative for ischemia. He continues to report some shortness of breath with exertion but not as significant. He has had a number of episodes of shortness of breath which I believe are exacerbations of chronic bronchitis and COPD. He only uses her rescue inhaler but fairly frequently. He continues to smoke but has cut back some. I referred him to see Dr. Lake Bells who diagnosed him  with COPD stage C, he has been started on Spiriva with an improvement in his shortness of breath. He also has a albuterol inhaler to use as needed. Generally he feels is doing well.  [name] presents today for follow-up. Since I last saw him, he underwent a sleep study which was mildly abnormal although CPAP was not recommended. He tells me that he's been having some worsening chest discomfort and more significantly short of breath when doing activities such as walking up stairs or normal chores. He says his symptoms improved fairly quickly with resting. He denies any worsening productive cough or sputum production. He's been a little bit wheezy. He continues to use his inhalers as prescribed by his pulmonologist. His last stress test was 2 years ago and negative for ischemia. EKG today shows sinus bradycardia at 56 without ischemic changes.  [name] returns today for follow-up. His stress test was negative for ischemia and showed normal LV function. I suspect more of her symptoms are related to COPD. He has not been as active recently and may be somewhat deconditioned. He probably benefit from more walking and her pulmonary rehabilitation. He's seen pulmonary and they recently did a repeat CT scan. Would not pursue further cardiac workup at this time but will continue to work on risk factor modification.  PMHx:  Past Medical History  Diagnosis Date  . Hypertension   . COPD (chronic obstructive pulmonary disease) ([place])   . Dyslipidemia   . Allergic rhinitis   . Asthma   . Anxiety and depression   .  GERD (gastroesophageal reflux disease)   . IBS (irritable bowel syndrome)   . Mitral regurgitation   . 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 Chronic low back pain   . Hearing loss   . Hyperplastic colon polyp 12/28/10  . Tubular adenoma 12/28/10  . Hemorrhoids   . OSA on CPAP   . CAD (coronary artery disease)     mild to mod - LAD lesion in 2006  . History of nuclear stress test 08/2009     bruce protocol; negative for ischemia  . Easy fatigability     Past Surgical History  Procedure Laterality Date  . Inguinal hernia repair      left  . Ear pinna reconstruction w/ rib graft      right  . Cholecystectomy    . Tonsillectomy    . Foot surgery      right  . Lung biopsy  2009    right  . Colonoscopy  2007    friable anal canal, hyperplastic polyp  . Esophagogastroduodenoscopy      multiple dilations, last EGD 2007 showed small hh, adenomatous appearing gastric mucosa in body but biopsies benign. SB bx negative for Celiac.  Marland Kitchen Esophagogastroduodenoscopy  12/28/2010    Dr. Gala Romney- normal esophagus s/p dilationpatchy erythema and erosions.  Venia Minks dilation  12/28/2010    Procedure: Venia Minks DILATION;  Surgeon: Daneil Dolin, MD;  Location: [facility];  Service: Endoscopy;  Laterality: N/A;  . Colonoscopy  12/28/2010    Dr. Gala Romney- tubular adenoma, hyperplastic polyp  . Transthoracic echocardiogram  02/2011    EF =>55%, normal chamber size & function; mild mitral and aortic insuff, mild-mod tricuspid insuff; mild pulm htn with rsvp of 78mmHg  . Colonoscopy N/A 01/16/2015    Procedure: COLONOSCOPY;  Surgeon: Daneil Dolin, MD;  Location: [facility];  Service: Endoscopy;  Laterality: N/A;  1330    FAMHx:  Family History  Problem Relation Age of Onset  . Colon cancer Neg Hx   . Liver disease Neg Hx   . Inflammatory bowel disease Neg Hx   . Heart disease Father 54    etoh/breathing problmes  . GI problems Mother   . Hyperlipidemia Mother   . Heart attack Maternal Grandmother     also valvular problems  . Heart attack Maternal Grandfather   . Diabetes Paternal Grandmother   . Heart Problems Paternal Grandfather   . Heart attack Brother     stenting x3, also cancer  . Skin cancer Brother   . Hypertension Brother   . Hypertension Sister   . COPD Mother   . COPD Father   . COPD Paternal Grandmother     SOCHx:   reports that he has been smoking Cigarettes.  He  started smoking about 37 years ago. He has a 37 pack-year smoking history. He has never used smokeless tobacco. He reports that he does not drink alcohol or use illicit drugs.  ALLERGIES:  Allergies  Allergen Reactions  . Bee Venom Swelling    ROS: A comprehensive review of systems was negative except for: Respiratory: positive for dyspnea on exertion and wheezing  HOME MEDS: Current Outpatient Prescriptions  Medication Sig Dispense Refill  . albuterol (PROVENTIL HFA;VENTOLIN HFA) 108 (90 BASE) MCG/ACT inhaler Inhale 2 puffs into the lungs every 6 (six) hours as needed for wheezing.    Marland Kitchen ALPRAZolam (XANAX) 1 MG tablet Take 1 mg by mouth at bedtime as needed.      Marland Kitchen  aspirin 81 MG tablet Take 81 mg by mouth daily.      . fenofibrate (TRICOR) 145 MG tablet Take 145 mg by mouth daily.      . fish oil-omega-3 fatty acids 1000 MG capsule Take 2 g by mouth daily.      Marland Kitchen glucosamine-chondroitin 500-400 MG tablet Take 1 tablet by mouth 2 (two) times daily.    . hydrochlorothiazide (HYDRODIURIL) 12.5 MG tablet Take 12.5 mg by mouth daily.    Marland Kitchen HYDROcodone-acetaminophen (NORCO) 10-325 MG per tablet Take 1 tablet by mouth every 6 (six) hours as needed for pain.    . metoprolol tartrate (LOPRESSOR) 25 MG tablet Take 1 tablet (25 mg total) by mouth 2 (two) times daily. 180 tablet 3  . Multiple Vitamin (MULTIVITAMIN) capsule Take 1 capsule by mouth daily.      . nitroGLYCERIN (NITROSTAT) 0.4 MG SL tablet Place 1 tablet (0.4 mg total) under the tongue every 5 (five) minutes as needed for chest pain. 25 tablet 3  . polyethylene glycol-electrolytes (NULYTELY/GOLYTELY) 420 G solution Take 4,000 mLs by mouth once. 4000 mL 0  . simvastatin (ZOCOR) 20 MG tablet Take 1 tablet (20 mg total) by mouth at bedtime. 30 tablet 11  . tiotropium (SPIRIVA) 18 MCG inhalation capsule Place 1 capsule (18 mcg total) into inhaler and inhale daily. 30 capsule 5  . valsartan (DIOVAN) 160 MG tablet Take 80 mg by mouth daily.       Marland Kitchen albuterol (PROVENTIL) 2 MG tablet Use as directed  2  . omeprazole (PRILOSEC) 20 MG capsule Take 1 capsule (20 mg total) by mouth 2 (two) times daily. 60 capsule 0  . omeprazole (PRILOSEC) 20 MG capsule Take 1 capsule by mouth daily. take 1 tab daily  2   No current facility-administered medications for this visit.    LABS/IMAGING: No results found for this or any previous visit (from the past 48 hour(s)). No results found.  VITALS: BP 112/70 mmHg  Pulse 76  Ht 5\' 8"  (1.727 m)  Wt 200 lb 4 oz (90.833 kg)  BMI 30.46 kg/m2  EXAM: Deferred  EKG: Deferred  ASSESSMENT: 1. Exertional chest pain and progressive dyspnea on exertion - low risk Myoview with normal LV function 2. Known coronary artery disease with a 40% LAD stenosis in 2006 3. Ongoing tobacco abuse 4. COPD  5. Mild aortic and mitral insufficiency - no change in murmur intensity 6. Dyslipidemia - recently checked by PCP and at goal 7. Hypertension - at goal on current hypertensives, no changes 8. Obstructive sleep apnea on CPAP   PLAN: 1.   [name] had a low risk Myoview with normal LV function and no ischemia. He's been working with pulmonary suspect that this more likely the cause of his shortness of breath. He denies any recurrent chest pain. He's also been working on smoking cessation and I think this would be very beneficial for him. Also some more exercise or perhaps pulmonary rehabilitation if he would qualify.  We'll plan to see him back in 6-12 months.  Pixie Casino, MD, [facility] Attending Cardiologist [place] [facility] [place] 06/19/2015, 6:41 PM

## 2015-07-08 DIAGNOSIS — N3289 Other specified disorders of bladder: Secondary | ICD-10-CM | POA: Diagnosis not present

## 2015-07-08 DIAGNOSIS — N281 Cyst of kidney, acquired: Secondary | ICD-10-CM | POA: Diagnosis not present

## 2015-07-08 DIAGNOSIS — K4090 Unilateral inguinal hernia, without obstruction or gangrene, not specified as recurrent: Secondary | ICD-10-CM | POA: Diagnosis not present

## 2015-07-08 DIAGNOSIS — K429 Umbilical hernia without obstruction or gangrene: Secondary | ICD-10-CM | POA: Diagnosis not present

## 2015-07-08 DIAGNOSIS — R1031 Right lower quadrant pain: Secondary | ICD-10-CM | POA: Diagnosis not present

## 2015-07-08 DIAGNOSIS — E279 Disorder of adrenal gland, unspecified: Secondary | ICD-10-CM | POA: Diagnosis not present

## 2015-07-08 DIAGNOSIS — Z6832 Body mass index (BMI) 32.0-32.9, adult: Secondary | ICD-10-CM | POA: Diagnosis not present

## 2015-07-08 DIAGNOSIS — Z1389 Encounter for screening for other disorder: Secondary | ICD-10-CM | POA: Diagnosis not present

## 2015-08-10 DIAGNOSIS — R1031 Right lower quadrant pain: Secondary | ICD-10-CM | POA: Diagnosis not present

## 2015-08-10 DIAGNOSIS — E6609 Other obesity due to excess calories: Secondary | ICD-10-CM | POA: Diagnosis not present

## 2015-08-10 DIAGNOSIS — Z6830 Body mass index (BMI) 30.0-30.9, adult: Secondary | ICD-10-CM | POA: Diagnosis not present

## 2015-08-10 DIAGNOSIS — N390 Urinary tract infection, site not specified: Secondary | ICD-10-CM | POA: Diagnosis not present

## 2015-08-10 DIAGNOSIS — Z1389 Encounter for screening for other disorder: Secondary | ICD-10-CM | POA: Diagnosis not present

## 2015-08-20 ENCOUNTER — Encounter: Payer: Self-pay | Admitting: Internal Medicine

## 2015-09-14 ENCOUNTER — Encounter: Payer: Self-pay | Admitting: Gastroenterology

## 2015-09-14 ENCOUNTER — Ambulatory Visit (INDEPENDENT_AMBULATORY_CARE_PROVIDER_SITE_OTHER): Payer: Medicare Other | Admitting: Gastroenterology

## 2015-09-14 VITALS — BP 118/81 | HR 62 | Temp 97.0°F | Ht 66.0 in | Wt 196.2 lb

## 2015-09-14 DIAGNOSIS — R109 Unspecified abdominal pain: Secondary | ICD-10-CM

## 2015-09-14 NOTE — Assessment & Plan Note (Signed)
65 year old gentleman with history of chronic GERD who presents with intermittent right mid to upper quadrant pain. Symptoms worse with movement. Improved with antibiotic therapy but with relapsing symptoms. Appendix reported to be normal on recent CT. There was question of cystitis. Patient denies dysuria or urinary frequency. Etiology unclear. Possibly multifactorial. Cannot exclude musculoskeletal component.  I have requested records of labs and urinalysis/culture from PCP for review. Further recommendations to follow. Patient will call if ongoing or worsening abdominal pain. Discussed with patient, consider watching and wait approach since he is feeling better at this point.

## 2015-09-14 NOTE — Progress Notes (Signed)
cc'ed to pcp °

## 2015-09-14 NOTE — Patient Instructions (Signed)
1. I will review records from Dr. Nolon Rod office and let you know if additional labs are needed. 2. Please call if ongoing abdominal pain or worsening abdominal pain.

## 2015-09-14 NOTE — Progress Notes (Signed)
Primary Care Physician:  Glo Herring., MD  Primary Gastroenterologist:  Garfield Cornea, MD   Chief Complaint  Patient presents with  . Abdominal Pain    HPI:  [name] is a 65 y.o. male here for further evaluation of abdominal pain. CT scan in February 2017 with questionable cystitis, small fat-containing umbilical and inguinal hernias. No evidence of appendicitis or diverticulitis. Treated with Cipro and subsequently with Bactrim.  Patient states his pain began back in February. Quite severe at times. Intermittent. Unrelated to meals. Right midright upper quadrant. Worse with movement. Bowel movements regular. Due to possible cystitis on CT, he was treated with Cipro. Abdominal pain improved but came back. Subsequently treated with Bactrim. Patient states his abdominal pain improved on antibiotics but in between courses it was quite severe. Currently he has intermittent symptoms, very mild. States his urine is dark and smells strong. Denies dysuria or hematuria. Occasional bright red blood on the toilet tissue. No nausea or vomiting. Heartburn controlled on Prilosec. Cannot miss a dose or he "pays for it". Denies fever.  Patient reports having similar symptoms about 5 years ago. Was seen by a surgeon who gave him antibiotic and contemplated exploratory surgery with appendectomy if going symptoms. Patient states the pain went away so he never went back.   No ASA/NSAIDs.  Current Outpatient Prescriptions  Medication Sig Dispense Refill  . albuterol (PROVENTIL HFA;VENTOLIN HFA) 108 (90 BASE) MCG/ACT inhaler Inhale 2 puffs into the lungs every 6 (six) hours as needed for wheezing.    Marland Kitchen albuterol (PROVENTIL) 2 MG tablet Use as directed  2  . ALPRAZolam (XANAX) 1 MG tablet Take 1 mg by mouth at bedtime as needed.      Marland Kitchen aspirin 81 MG tablet Take 81 mg by mouth daily.      . fenofibrate (TRICOR) 145 MG tablet Take 145 mg by mouth daily.      . fish oil-omega-3 fatty acids 1000 MG  capsule Take 2 g by mouth daily.      Marland Kitchen glucosamine-chondroitin 500-400 MG tablet Take 1 tablet by mouth 2 (two) times daily.    . hydrochlorothiazide (HYDRODIURIL) 12.5 MG tablet Take 12.5 mg by mouth daily.    Marland Kitchen HYDROcodone-acetaminophen (NORCO) 10-325 MG per tablet Take 1 tablet by mouth every 6 (six) hours as needed for pain.    . metoprolol tartrate (LOPRESSOR) 25 MG tablet Take 1 tablet (25 mg total) by mouth 2 (two) times daily. 180 tablet 3  . Multiple Vitamin (MULTIVITAMIN) capsule Take 1 capsule by mouth daily.      . nitroGLYCERIN (NITROSTAT) 0.4 MG SL tablet Place 1 tablet (0.4 mg total) under the tongue every 5 (five) minutes as needed for chest pain. 25 tablet 3  . omeprazole (PRILOSEC) 20 MG capsule Take 1 capsule by mouth daily. take 1 tab daily  2  . simvastatin (ZOCOR) 20 MG tablet Take 1 tablet (20 mg total) by mouth at bedtime. 30 tablet 11  . tiotropium (SPIRIVA) 18 MCG inhalation capsule Place 1 capsule (18 mcg total) into inhaler and inhale daily. 30 capsule 5  . valsartan (DIOVAN) 160 MG tablet Take 80 mg by mouth daily.     Marland Kitchen omeprazole (PRILOSEC) 20 MG capsule Take 1 capsule (20 mg total) by mouth 2 (two) times daily. 60 capsule 0   No current facility-administered medications for this visit.    Allergies as of 09/14/2015 - Review Complete 09/14/2015  Allergen Reaction Noted  . Bee venom Swelling 12/28/2010  Past Medical History  Diagnosis Date  . Hypertension   . COPD (chronic obstructive pulmonary disease) ([place])   . Dyslipidemia   . Allergic rhinitis   . Asthma   . Anxiety and depression   . GERD (gastroesophageal reflux disease)   . IBS (irritable bowel syndrome)   . Mitral regurgitation   . 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 Chronic low back pain   . Hearing loss   . Hyperplastic colon polyp 12/28/10  . Tubular adenoma 12/28/10  . Hemorrhoids   . OSA on CPAP   . CAD (coronary artery disease)     mild to mod - LAD lesion in 2006  . History of  nuclear stress test 08/2009    bruce protocol; negative for ischemia  . Easy fatigability     Past Surgical History  Procedure Laterality Date  . Inguinal hernia repair      left  . Ear pinna reconstruction w/ rib graft      right  . Cholecystectomy    . Tonsillectomy    . Foot surgery      right  . Lung biopsy  2009    right  . Colonoscopy  2007    friable anal canal, hyperplastic polyp  . Esophagogastroduodenoscopy      multiple dilations, last EGD 2007 showed small hh, adenomatous appearing gastric mucosa in body but biopsies benign. SB bx negative for Celiac.  Marland Kitchen Esophagogastroduodenoscopy  12/28/2010    Dr. Gala Romney- normal esophagus s/p dilationpatchy erythema and erosions.  Venia Minks dilation  12/28/2010    Procedure: Venia Minks DILATION;  Surgeon: Daneil Dolin, MD;  [facility];  Service: Endoscopy;  Laterality: N/A;  . Colonoscopy  12/28/2010    Dr. Gala Romney- tubular adenoma, hyperplastic polyp  . Transthoracic echocardiogram  02/2011    EF =>55%, normal chamber size & function; mild mitral and aortic insuff, mild-mod tricuspid insuff; mild pulm htn with rsvp of 61mmHg  . Colonoscopy N/A 01/16/2015    Rourk: anal canal and internal hemorrhois. next surveillance colonoscopy 12/2019    Family History  Problem Relation Age of Onset  . Colon cancer Neg Hx   . Liver disease Neg Hx   . Inflammatory bowel disease Neg Hx   . Heart disease Father 85    etoh/breathing problmes  . GI problems Mother   . Hyperlipidemia Mother   . Heart attack Maternal Grandmother     also valvular problems  . Heart attack Maternal Grandfather   . Diabetes Paternal Grandmother   . Heart Problems Paternal Grandfather   . Heart attack Brother     stenting x3, also cancer  . Skin cancer Brother   . Hypertension Brother   . Hypertension Sister   . COPD Mother   . COPD Father   . COPD Paternal Grandmother     Social History   Social History  . Marital Status: Married    Spouse Name: N/A   . Number of Children: 1  . Years of Education: N/A   Occupational History  . disability    Social History Main Topics  . Smoking status: Current Every Day Smoker -- 1.00 packs/day for 37 years    Types: Cigarettes    Start date: 05/23/1978  . Smokeless tobacco: Never Used     Comment: 4-5 cigarettes daily  . Alcohol Use: No  . Drug Use: No  . Sexual Activity: Not on file   Other Topics Concern  . Not on file  Social History Narrative   Son, age 24, hit by truck.   Drinks about 2-3 cups of coffee a day. Occasionally drinks tea.       ROS:  General: Negative for anorexia, weight loss, fever, chills, fatigue, weakness. Eyes: Negative for vision changes.  ENT: Negative for hoarseness, difficulty swallowing , nasal congestion. CV: Negative for chest pain, angina, palpitations, dyspnea on exertion, peripheral edema.  Respiratory: Negative for dyspnea at rest, dyspnea on exertion, cough, sputum, wheezing.  GI: See history of present illness. GU:  Negative for dysuria, hematuria, urinary incontinence, urinary frequency, nocturnal urination.  MS: Negative for joint pain,chronic low back pain.  Derm: Negative for rash or itching.  Neuro: Negative for weakness, abnormal sensation, seizure, frequent headaches, memory loss, confusion.  Psych: Negative for anxiety, depression, suicidal ideation, hallucinations.  Endo: Negative for unusual weight change.  Heme: Negative for bruising or bleeding. Allergy: Negative for rash or hives.    Physical Examination:  BP 118/81 mmHg  Pulse 62  Temp(Src) 97 F (36.1 C)  Ht 5\' 6"  (1.676 m)  Wt 196 lb 3.2 oz (88.996 kg)  BMI 31.68 kg/m2   General: Well-nourished, well-developed in no acute distress.  Head: Normocephalic, atraumatic.   Eyes: Conjunctiva pink, no icterus. Mouth: Oropharyngeal mucosa moist and pink , no lesions erythema or exudate. Neck: Supple without thyromegaly, masses, or lymphadenopathy.  Lungs: Clear to  auscultation bilaterally.  Heart: Regular rate and rhythm, no murmurs rubs or gallops.  Abdomen: Bowel sounds are normal, mild point tenderness right mid-upper abd, nondistended, no hepatosplenomegaly or masses, no abdominal bruits or    hernia , no rebound or guarding.   Rectal: deferred Extremities: No lower extremity edema. No clubbing or deformities.  Neuro: Alert and oriented x 4 , grossly normal neurologically.  Skin: Warm and dry, no rash or jaundice.   Psych: Alert and cooperative, normal mood and affect.  Labs: Requested.  Imaging Studies: No results found.

## 2015-09-18 DIAGNOSIS — E6609 Other obesity due to excess calories: Secondary | ICD-10-CM | POA: Diagnosis not present

## 2015-09-18 DIAGNOSIS — F419 Anxiety disorder, unspecified: Secondary | ICD-10-CM | POA: Diagnosis not present

## 2015-09-18 DIAGNOSIS — J449 Chronic obstructive pulmonary disease, unspecified: Secondary | ICD-10-CM | POA: Diagnosis not present

## 2015-09-18 DIAGNOSIS — G894 Chronic pain syndrome: Secondary | ICD-10-CM | POA: Diagnosis not present

## 2015-09-18 DIAGNOSIS — K219 Gastro-esophageal reflux disease without esophagitis: Secondary | ICD-10-CM | POA: Diagnosis not present

## 2015-09-18 DIAGNOSIS — Z6832 Body mass index (BMI) 32.0-32.9, adult: Secondary | ICD-10-CM | POA: Diagnosis not present

## 2015-09-18 DIAGNOSIS — L2089 Other atopic dermatitis: Secondary | ICD-10-CM | POA: Diagnosis not present

## 2015-09-18 DIAGNOSIS — Z1389 Encounter for screening for other disorder: Secondary | ICD-10-CM | POA: Diagnosis not present

## 2015-09-30 NOTE — Progress Notes (Signed)
Reviewed labs that came from PCP dated 2 2017. Met 7 normal. From March 2017 urinalysis with trace leukocyte esterase. Patient was treated for UTI at the time.  Please find out how patient is doing. Is he still having abdominal pain.

## 2015-10-06 NOTE — Progress Notes (Signed)
Tried to call pt- NA- LMOM. Asked him to call back and let us know how he is doing.

## 2015-10-07 NOTE — Progress Notes (Signed)
Pt called back and left a voicemail, he said he still has occasional abd pain but it is much better than it was when he came in.

## 2015-10-09 NOTE — Progress Notes (Signed)
Please offer patient nonurgent follow up with RMR only or call if ongoing symptoms.

## 2015-10-20 ENCOUNTER — Encounter: Payer: Self-pay | Admitting: Internal Medicine

## 2015-10-20 NOTE — Progress Notes (Signed)
APPT MADE AND LETTER SENT  °

## 2015-11-03 ENCOUNTER — Ambulatory Visit (INDEPENDENT_AMBULATORY_CARE_PROVIDER_SITE_OTHER): Payer: Medicare Other | Admitting: Internal Medicine

## 2015-11-03 ENCOUNTER — Encounter: Payer: Self-pay | Admitting: Internal Medicine

## 2015-11-03 VITALS — BP 114/72 | HR 68 | Temp 97.6°F | Ht 66.0 in | Wt 196.8 lb

## 2015-11-03 DIAGNOSIS — K219 Gastro-esophageal reflux disease without esophagitis: Secondary | ICD-10-CM

## 2015-11-03 DIAGNOSIS — R109 Unspecified abdominal pain: Secondary | ICD-10-CM

## 2015-11-03 NOTE — Patient Instructions (Signed)
UA, CBC, lipase, Chem-12 today  Please bring CD of recent CT scan to office so I can review with the radiologist  Further recommendations to follow

## 2015-11-03 NOTE — Progress Notes (Signed)
Primary Care Physician:  Glo Herring., MD Primary Gastroenterologist:  Dr.Carron Mcmurry  Pre-Procedure History & Physical: HPI:  [name] is a 65 y.o. male here for further evaluation of right mid abdominal pain. Waxes and wanes. Pain affected by moving. Not effect by having a bowel movement. He states his urine is dark and malodorous from time to time. No flank pain or fevers. Has has taken a couple courses of antibiotics with at least temporary improvement in symptoms. He did have a urinalysis with leukocyte esterase positive recently. He had a CT scan earlier in the year at outside facility - results of which are unknown to me although he was told there were some report of a "thickened bladder". Denies right upper quadrant abdominal pain. Has has had no melena or hematochezia; states regular bowel movements. Reflux symptoms well controlled on omeprazole 20 mg daily.  He tells me this pain goes back at least 5 years. It comes and goes. He saw a surgeon at one point about 5 years ago and there were some contemplation about exploratory laparotomy with incidental appendectomy. Symptoms all day and he never followed through with that avenue of treatment. History of colonic adenoma; negative surveillance colonoscopy 2016.  Past Medical History  Diagnosis Date  . Hypertension   . COPD (chronic obstructive pulmonary disease) ([place])   . Dyslipidemia   . Allergic rhinitis   . Asthma   . Anxiety and depression   . GERD (gastroesophageal reflux disease)   . IBS (irritable bowel syndrome)   . Mitral regurgitation   . 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 Chronic low back pain   . Hearing loss   . Hyperplastic colon polyp 12/28/10  . Tubular adenoma 12/28/10  . Hemorrhoids   . OSA on CPAP   . CAD (coronary artery disease)     mild to mod - LAD lesion in 2006  . History of nuclear stress test 08/2009    bruce protocol; negative for ischemia  . Easy fatigability     Past Surgical History    Procedure Laterality Date  . Inguinal hernia repair      left  . Ear pinna reconstruction w/ rib graft      right  . Cholecystectomy    . Tonsillectomy    . Foot surgery      right  . Lung biopsy  2009    right  . Colonoscopy  2007    friable anal canal, hyperplastic polyp  . Esophagogastroduodenoscopy      multiple dilations, last EGD 2007 showed small hh, adenomatous appearing gastric mucosa in body but biopsies benign. SB bx negative for Celiac.  Marland Kitchen Esophagogastroduodenoscopy  12/28/2010    Dr. Gala Romney- normal esophagus s/p dilationpatchy erythema and erosions.  Venia Minks dilation  12/28/2010    Procedure: Venia Minks DILATION;  Surgeon: Daneil Dolin, MD;  [facility]: [facility];  Service: Endoscopy;  Laterality: N/A;  . Colonoscopy  12/28/2010    Dr. Gala Romney- tubular adenoma, hyperplastic polyp  . Transthoracic echocardiogram  02/2011    EF =>55%, normal chamber size & function; mild mitral and aortic insuff, mild-mod tricuspid insuff; mild pulm htn with rsvp of 49mmHg  . Colonoscopy N/A 01/16/2015    Henley Blyth: anal canal and internal hemorrhois. next surveillance colonoscopy 12/2019    Prior to Admission medications   Medication Sig Start Date End Date Taking? Authorizing Provider  albuterol (PROVENTIL HFA;VENTOLIN HFA) 108 (90 BASE) MCG/ACT inhaler Inhale 2 puffs into the lungs  every 6 (six) hours as needed for wheezing.   Yes Historical Provider, MD  albuterol (PROVENTIL) 2 MG tablet Use as directed 06/12/15  Yes Historical Provider, MD  ALPRAZolam Duanne Moron) 1 MG tablet Take 1 mg by mouth at bedtime as needed.     Yes Historical Provider, MD  aspirin 81 MG tablet Take 81 mg by mouth daily.     Yes Historical Provider, MD  fenofibrate (TRICOR) 145 MG tablet Take 145 mg by mouth daily.     Yes Historical Provider, MD  fish oil-omega-3 fatty acids 1000 MG capsule Take 2 g by mouth daily.     Yes Historical Provider, MD  glucosamine-chondroitin 500-400 MG tablet Take 1 tablet by mouth 2 (two)  times daily.   Yes Historical Provider, MD  hydrochlorothiazide (HYDRODIURIL) 12.5 MG tablet Take 12.5 mg by mouth daily.   Yes Historical Provider, MD  HYDROcodone-acetaminophen (NORCO) 10-325 MG per tablet Take 1 tablet by mouth every 6 (six) hours as needed for pain. Reported on 11/03/2015   Yes Historical Provider, MD  metoprolol tartrate (LOPRESSOR) 25 MG tablet Take 1 tablet (25 mg total) by mouth 2 (two) times daily. 04/22/14  Yes Pixie Casino, MD  Multiple Vitamin (MULTIVITAMIN) capsule Take 1 capsule by mouth daily.     Yes Historical Provider, MD  nitroGLYCERIN (NITROSTAT) 0.4 MG SL tablet Place 1 tablet (0.4 mg total) under the tongue every 5 (five) minutes as needed for chest pain. 03/21/13  Yes Pixie Casino, MD  omeprazole (PRILOSEC) 20 MG capsule Take 1 capsule by mouth daily. take 1 tab daily 05/31/15  Yes Historical Provider, MD  simvastatin (ZOCOR) 20 MG tablet Take 1 tablet (20 mg total) by mouth at bedtime. 06/19/15  Yes Pixie Casino, MD  tiotropium (SPIRIVA) 18 MCG inhalation capsule Place 1 capsule (18 mcg total) into inhaler and inhale daily. 11/07/13  Yes Juanito Doom, MD  valsartan (DIOVAN) 160 MG tablet Take 80 mg by mouth daily.    Yes Historical Provider, MD  omeprazole (PRILOSEC) 20 MG capsule Take 1 capsule (20 mg total) by mouth 2 (two) times daily. 11/26/12 05/08/15  Orvil Feil, NP    Allergies as of 11/03/2015 - Review Complete 11/03/2015  Allergen Reaction Noted  . Bee venom Swelling 12/28/2010    Family History  Problem Relation Age of Onset  . Colon cancer Neg Hx   . Liver disease Neg Hx   . Inflammatory bowel disease Neg Hx   . Heart disease Father 32    etoh/breathing problmes  . GI problems Mother   . Hyperlipidemia Mother   . Heart attack Maternal Grandmother     also valvular problems  . Heart attack Maternal Grandfather   . Diabetes Paternal Grandmother   . Heart Problems Paternal Grandfather   . Heart attack Brother     stenting x3,  also cancer  . Skin cancer Brother   . Hypertension Brother   . Hypertension Sister   . COPD Mother   . COPD Father   . COPD Paternal Grandmother     Social History   Social History  . Marital Status: Married    Spouse Name: N/A  . Number of Children: 1  . Years of Education: N/A   Occupational History  . disability    Social History Main Topics  . Smoking status: Current Every Day Smoker -- 1.00 packs/day for 37 years    Types: Cigarettes    Start date: 05/23/1978  .  Smokeless tobacco: Never Used     Comment: 4-5 cigarettes daily  . Alcohol Use: No  . Drug Use: No  . Sexual Activity: Not on file   Other Topics Concern  . Not on file   Social History Narrative   Son, age 66, hit by truck.   Drinks about 2-3 cups of coffee a day. Occasionally drinks tea.     Review of Systems: See HPI, otherwise negative ROS  Physical Exam: BP 114/72 mmHg  Pulse 68  Temp(Src) 97.6 F (36.4 C) (Oral)  Ht 5\' 6"  (1.676 m)  Wt 196 lb 12.8 oz (89.268 kg)  BMI 31.78 kg/m2 General:    well-nourished, pleasant and cooperative in NAD Skin:  Intact without significant lesions or rashes. Eyes:  Sclera clear, no icterus.   Conjunctiva pink. Neck:  Supple; no masses or thyromegaly. No significant cervical adenopathy. Lungs:  Clear throughout to auscultation.   No wheezes, crackles, or rhonchi. No acute distress. Heart:  Regular rate and rhythm; no murmurs, clicks, rubs,  or gallops. Abdomen: Non-distended, normal bowel sounds.  Soft and nontender without appreciable mass or hepatosplenomegaly.  Pulses:  Normal pulses noted. Extremities:  Without clubbing or edema. Back: No CVA tenderness  Impression:  Pleasant 65 year old gentleman with a long history of GERD well-controlled on PPI therapy presents with recurrence of intermittent right mid abdominal pain. His pain has a musculoskeletal flavor.  Small umbilical hernia noted on prior CT scan reportedly. His report of antibiotics helping  his symptoms is an interesting observation. He may have more of an issue ongoing at this time. I did not appreciate an umbilical hernia on exam today but he is reported to have a small one. I doubt deep visceral pathology such as diverticulitis, pancreatitis or even chronic appendicitis at this time. I would very much like to review the actual CT images with the radiologist.  He has asked for another prescription for antibiotics. I have declined to provide him with a prescription for antibiotics at this time pending the outcome of the evaluation.   Recommendations:  UA, CBC, lipase, Chem-12 today  Please bring CD of recent CT scan to office so I can review with the radiologist  Further recommendations to follow  Notice: This dictation was prepared with Dragon dictation along with smaller phrase technology. Any transcriptional errors that result from this process are unintentional and may not be corrected upon review.

## 2015-11-05 ENCOUNTER — Other Ambulatory Visit: Payer: Self-pay

## 2015-11-05 ENCOUNTER — Telehealth: Payer: Self-pay

## 2015-11-05 DIAGNOSIS — D3500 Benign neoplasm of unspecified adrenal gland: Secondary | ICD-10-CM

## 2015-11-05 DIAGNOSIS — R748 Abnormal levels of other serum enzymes: Secondary | ICD-10-CM

## 2015-11-05 DIAGNOSIS — R109 Unspecified abdominal pain: Secondary | ICD-10-CM

## 2015-11-05 NOTE — Telephone Encounter (Signed)
Labs for Mr.Bonadonna are on RMR cart.

## 2015-11-05 NOTE — Telephone Encounter (Signed)
Reviewed [facility] CT at length with Dr. Thornton Papas. Scan done on 07/08/2015. Bladder wall diffusely thickened without obvious tumor. Patient has perivesicular fat inflammation as well. Small left adrenal adenoma. Bilateral inguinal hernias (left greater than right.)  No kidney stones seen. Patient with a tiny umbilical hernia containing only fat-likely not causing any problems. A CBC, CMP, serum lipase and urinalysis back. Verbal report everything looks good except for creatinine of 1.43 which is elevated compared to prior labs.  I suspect that his abdominal pain is emanating from his urinary bladder. He needs to see the urologist. Will make the referral in communication with [facility]. Also, his elevated creatinine needs to be addressed further.  We'll convey these findings and recommendations to the patient today.

## 2015-11-05 NOTE — Telephone Encounter (Signed)
[facility] and spoke with Melody- she spoke with Dr.Fusco and he said he didn't have a preference of urologists. They are also aware of creatinine. I am faxing labs and copy of this note to Dr.Fusco.  I have called and informed the pt, he doesn't have a preference of urologists either. He is aware that Dr.Rourk will be bringing the cd of the CT back to the office tomorrow and pt will need to pick it up before he goes to the urologist. It will be at the front desk.  Candy, please refer pt to urologist.

## 2015-11-05 NOTE — Telephone Encounter (Signed)
Referral sent to [facility]. 

## 2015-11-06 ENCOUNTER — Ambulatory Visit (INDEPENDENT_AMBULATORY_CARE_PROVIDER_SITE_OTHER): Payer: Medicare Other | Admitting: Adult Health

## 2015-11-06 ENCOUNTER — Encounter: Payer: Self-pay | Admitting: Adult Health

## 2015-11-06 VITALS — BP 112/70 | HR 65 | Temp 98.1°F | Ht 66.0 in | Wt 197.0 lb

## 2015-11-06 DIAGNOSIS — J441 Chronic obstructive pulmonary disease with (acute) exacerbation: Secondary | ICD-10-CM

## 2015-11-06 DIAGNOSIS — J449 Chronic obstructive pulmonary disease, unspecified: Secondary | ICD-10-CM | POA: Diagnosis not present

## 2015-11-06 NOTE — Patient Instructions (Signed)
Continue on quitting  Smoking.  Call 1-800-QUIT-NOW Use Spiriva daily.  Follow up with Dr. Lake Bells in 6 months and As needed

## 2015-11-06 NOTE — Assessment & Plan Note (Signed)
Compensated   Plan  Cont on current regimen

## 2015-11-06 NOTE — Progress Notes (Signed)
Subjective:    Patient ID: [name], male    DOB: 1950/09/01, 65 y.o.   MRN: [id number]  HPI 65 year old male, active smoker, with COPD, Gold C 2015 FEV1 70%, ratio 63.    11/06/2015 Follow up : COPD , smoker  Patient returns for 6 month follow up  Patient says overall his breathing is doing okay. He does get short of breath with activity. At times.  Says he tries to stay active.  Patient does continue to smoke, smoking cessation was discussed. Patient had a low dose screening CT chest in Jan 2017  with a benign appearance.   He remains on Spiriva daily. Previously, he was tried on BREO but did not feel like that this was any benefit.  He denies any chest pain, hemoptysis, orthopnea, PND, or increased leg swelling. Prevnar vaccine 2015 . Says pneumovax few years ago. Discussed repeat PVX when he turns 67.    Past Medical History  Diagnosis Date  . Hypertension   . COPD (chronic obstructive pulmonary disease) ([place])   . Dyslipidemia   . Allergic rhinitis   . Asthma   . Anxiety and depression   . GERD (gastroesophageal reflux disease)   . IBS (irritable bowel syndrome)   . Mitral regurgitation   . 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 Chronic low back pain   . Hearing loss   . Hyperplastic colon polyp 12/28/10  . Tubular adenoma 12/28/10  . Hemorrhoids   . OSA on CPAP   . CAD (coronary artery disease)     mild to mod - LAD lesion in 2006  . History of nuclear stress test 08/2009    bruce protocol; negative for ischemia  . Easy fatigability       Review of Systems Constitutional:   No  weight loss, night sweats,  Fevers, chills, fatigue, or  lassitude.  HEENT:   No headaches,  Difficulty swallowing,  Tooth/dental problems, or  Sore throat,                No sneezing, itching, ear ache, nasal congestion, post nasal drip,   CV:  No chest pain,  Orthopnea, PND, swelling in lower extremities, anasarca, dizziness, palpitations, syncope.   GI  No heartburn,  indigestion, abdominal pain, nausea, vomiting, diarrhea, change in bowel habits, loss of appetite, bloody stools.   Resp:   No chest wall deformity  Skin: no rash or lesions.  GU: no dysuria, change in color of urine, no urgency or frequency.  No flank pain, no hematuria   MS:  No joint pain or swelling.  No decreased range of motion.  No back pain.  Psych:  No change in mood or affect. No depression or anxiety.  No memory loss.         Objective:   Physical Exam  Filed Vitals:   11/06/15 0925  BP: 112/70  Pulse: 65  Temp: 98.1 F (36.7 C)  TempSrc: Oral  Height: 5\' 6"  (1.676 m)  Weight: 197 lb (89.359 kg)  SpO2: 97%    GEN: A/Ox3; pleasant , NAD, elderly   HEENT:  [place]/AT,  EACs-clear, TMs-wnl, NOSE-clear, THROAT-clear, no lesions, no postnasal drip or exudate noted.   NECK:  Supple w/ fair ROM; no JVD; normal carotid impulses w/o bruits; no thyromegaly or nodules palpated; no lymphadenopathy.  RESP  Decreased BS in bases , no accessory muscle use, no dullness to percussion  CARD:  RRR, no m/r/g  , no peripheral edema, pulses  intact, no cyanosis or clubbing.  GI:   Soft & nt; nml bowel sounds; no organomegaly or masses detected.  Musco: Warm bil, no deformities or joint swelling noted.   Neuro: alert, no focal deficits noted.    Skin: Warm, no lesions or rashes    Willoughby Doell NP-C  [facility]  11/06/2015

## 2015-11-06 NOTE — Assessment & Plan Note (Deleted)
Compensated   Plan  Cont on current regimen

## 2015-11-09 DIAGNOSIS — R10813 Right lower quadrant abdominal tenderness: Secondary | ICD-10-CM | POA: Diagnosis not present

## 2015-11-09 NOTE — Progress Notes (Signed)
Chart reviewed, agree with this plan of care. 

## 2015-11-27 DIAGNOSIS — F419 Anxiety disorder, unspecified: Secondary | ICD-10-CM | POA: Diagnosis not present

## 2015-11-27 DIAGNOSIS — K219 Gastro-esophageal reflux disease without esophagitis: Secondary | ICD-10-CM | POA: Diagnosis not present

## 2015-11-27 DIAGNOSIS — Z6831 Body mass index (BMI) 31.0-31.9, adult: Secondary | ICD-10-CM | POA: Diagnosis not present

## 2015-11-27 DIAGNOSIS — G894 Chronic pain syndrome: Secondary | ICD-10-CM | POA: Diagnosis not present

## 2015-11-27 DIAGNOSIS — I10 Essential (primary) hypertension: Secondary | ICD-10-CM | POA: Diagnosis not present

## 2015-12-16 DIAGNOSIS — S0500XA Injury of conjunctiva and corneal abrasion without foreign body, unspecified eye, initial encounter: Secondary | ICD-10-CM | POA: Diagnosis not present

## 2015-12-16 DIAGNOSIS — I10 Essential (primary) hypertension: Secondary | ICD-10-CM | POA: Diagnosis not present

## 2015-12-16 DIAGNOSIS — L259 Unspecified contact dermatitis, unspecified cause: Secondary | ICD-10-CM | POA: Diagnosis not present

## 2015-12-16 DIAGNOSIS — E782 Mixed hyperlipidemia: Secondary | ICD-10-CM | POA: Diagnosis not present

## 2015-12-16 DIAGNOSIS — K219 Gastro-esophageal reflux disease without esophagitis: Secondary | ICD-10-CM | POA: Diagnosis not present

## 2015-12-16 DIAGNOSIS — Z6832 Body mass index (BMI) 32.0-32.9, adult: Secondary | ICD-10-CM | POA: Diagnosis not present

## 2015-12-16 DIAGNOSIS — Z1389 Encounter for screening for other disorder: Secondary | ICD-10-CM | POA: Diagnosis not present

## 2016-03-11 ENCOUNTER — Encounter: Payer: Self-pay | Admitting: Acute Care

## 2016-03-11 ENCOUNTER — Other Ambulatory Visit: Payer: Self-pay | Admitting: Acute Care

## 2016-03-11 DIAGNOSIS — F17210 Nicotine dependence, cigarettes, uncomplicated: Secondary | ICD-10-CM

## 2016-03-22 DIAGNOSIS — Z1389 Encounter for screening for other disorder: Secondary | ICD-10-CM | POA: Diagnosis not present

## 2016-03-22 DIAGNOSIS — G4733 Obstructive sleep apnea (adult) (pediatric): Secondary | ICD-10-CM | POA: Diagnosis not present

## 2016-03-22 DIAGNOSIS — I10 Essential (primary) hypertension: Secondary | ICD-10-CM | POA: Diagnosis not present

## 2016-03-22 DIAGNOSIS — J449 Chronic obstructive pulmonary disease, unspecified: Secondary | ICD-10-CM | POA: Diagnosis not present

## 2016-03-22 DIAGNOSIS — E782 Mixed hyperlipidemia: Secondary | ICD-10-CM | POA: Diagnosis not present

## 2016-03-22 DIAGNOSIS — M779 Enthesopathy, unspecified: Secondary | ICD-10-CM | POA: Diagnosis not present

## 2016-03-22 DIAGNOSIS — Z23 Encounter for immunization: Secondary | ICD-10-CM | POA: Diagnosis not present

## 2016-03-22 DIAGNOSIS — Z0001 Encounter for general adult medical examination with abnormal findings: Secondary | ICD-10-CM | POA: Diagnosis not present

## 2016-03-22 DIAGNOSIS — J439 Emphysema, unspecified: Secondary | ICD-10-CM | POA: Diagnosis not present

## 2016-03-22 DIAGNOSIS — K219 Gastro-esophageal reflux disease without esophagitis: Secondary | ICD-10-CM | POA: Diagnosis not present

## 2016-05-09 ENCOUNTER — Ambulatory Visit (INDEPENDENT_AMBULATORY_CARE_PROVIDER_SITE_OTHER): Payer: Medicare Other | Admitting: Pulmonary Disease

## 2016-05-09 ENCOUNTER — Encounter: Payer: Self-pay | Admitting: Pulmonary Disease

## 2016-05-09 VITALS — BP 132/74 | HR 66 | Ht 66.0 in | Wt 204.0 lb

## 2016-05-09 DIAGNOSIS — Z720 Tobacco use: Secondary | ICD-10-CM

## 2016-05-09 DIAGNOSIS — F17210 Nicotine dependence, cigarettes, uncomplicated: Secondary | ICD-10-CM

## 2016-05-09 DIAGNOSIS — J411 Mucopurulent chronic bronchitis: Secondary | ICD-10-CM

## 2016-05-09 MED ORDER — UMECLIDINIUM-VILANTEROL 62.5-25 MCG/INH IN AEPB: 1.0000 | INHALATION_SPRAY | Freq: Every day | RESPIRATORY_TRACT | 0 refills | Status: DC

## 2016-05-09 MED ORDER — UMECLIDINIUM-VILANTEROL 62.5-25 MCG/INH IN AEPB: 1.0000 | INHALATION_SPRAY | Freq: Every day | RESPIRATORY_TRACT | 4 refills | Status: DC

## 2016-05-09 NOTE — Progress Notes (Signed)
Subjective:    Patient ID: [name], male    DOB: 1951-04-29, 65 y.o.   MRN: [id number]  Synopsis: First evaluated by [facility] in 2015 for evaluation of COPD.  He was still smoking as of 04/2016, started smoking age 33 and the most he has smoked is 1ppd.  HPI Chief Complaint  Patient presents with  . Follow-up    pt doing well, notes wheezing, sob with exertion, prod cough with clear mucus, sinus congestion- states this is his baseline.    [name] says he has a little trouble breathing and he needs albuterol 1-2 times a day.  He says that just any actiivty will make him short of breath.  He says that the has to pull his trash can up a hill and this will make him really short of breath.  Chest pain or tightness only if really short of breath.    He has not had bronchitis or pneumonia in the last year.  He says however that he needed a breathing treatment and prednisone.    Spiriva is on his medication list but he doesn't take.   He still smokes cigarettes and everyone around him smokes. He is smoking 1/2 ppd.    Past Medical History:  Diagnosis Date  . Allergic rhinitis   . Anxiety and depression   . Asthma   . CAD (coronary artery disease)    mild to mod - LAD lesion in 2006  . Chronic low back pain   . COPD (chronic obstructive pulmonary disease) ([place])   . Dyslipidemia   . Easy fatigability   . GERD (gastroesophageal reflux disease)   . Hearing loss   . Hemorrhoids   . History of nuclear stress test 08/2009   bruce protocol; negative for ischemia  . Hyperplastic colon polyp 12/28/10  . Hypertension   . IBS (irritable bowel syndrome)   . 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 Mitral regurgitation   . OSA on CPAP   . Tubular adenoma 12/28/10      Review of Systems  Constitutional: Negative for chills, fatigue and fever.  HENT: Negative for postnasal drip, rhinorrhea and sinus pain.   Respiratory: Positive for cough, shortness of breath and wheezing.     Cardiovascular: Negative for chest pain, palpitations and leg swelling.       Objective:   Physical Exam Vitals:   05/09/16 1132  BP: 132/74  Pulse: 66  SpO2: 96%  Weight: 204 lb (92.5 kg)  Height: 5\' 6"  (1.676 m)   RA  Gen: well appearing HENT: OP clear, TM's clear, neck supple PULM: CTA B, normal percussion CV: RRR, no mgr, trace edema GI: BS+, soft, nontender Derm: no cyanosis or rash Psyche: normal mood and affect   Spirometry: 2015 simple spirometry showed ratio 65% FEV1 2.45 L 70% predicted       Assessment & Plan:  Impression: COPD, Gold Grade C Tobacco abuse  Discussion [name] continues to smoke cigarettes despite his diagnosis of Gold grade C COPD. It sounds as if he did have an exacerbation this year treated by his primary care physician. Today I counseled him today at length on the need for him to quit smoking immediately. I explained that this will cause his shortness of breath and COPD to worsen.  Further, he is not taking any controller medicines for his COPD nor do I think he understands the concept of this. So is been a significant amount of time talking about the  importance of taking a once a day long-acting bronchodilator. I'm going to prescribe Anoro.  Plan: For COPD Immunizations are up-to-date Start Anoro 1 puff daily Continue albuterol as needed  4 tobacco abuse: Counseled at length today to quit smoking greater than 3 minutes  Follow-up 6 months  Current Outpatient Prescriptions:  .  albuterol (PROVENTIL HFA;VENTOLIN HFA) 108 (90 BASE) MCG/ACT inhaler, Inhale 2 puffs into the lungs every 6 (six) hours as needed for wheezing., Disp: , Rfl:  .  albuterol (PROVENTIL) 2 MG tablet, Reported on 11/06/2015, Disp: , Rfl: 2 .  ALPRAZolam (XANAX) 1 MG tablet, Take 1 mg by mouth at bedtime as needed.  , Disp: , Rfl:  .  aspirin 81 MG tablet, Take 81 mg by mouth daily.  , Disp: , Rfl:  .  fenofibrate (TRICOR) 145 MG tablet, Take 145 mg by mouth  daily.  , Disp: , Rfl:  .  fish oil-omega-3 fatty acids 1000 MG capsule, Take 2 g by mouth daily.  , Disp: , Rfl:  .  glucosamine-chondroitin 500-400 MG tablet, Take 1 tablet by mouth 2 (two) times daily., Disp: , Rfl:  .  hydrochlorothiazide (HYDRODIURIL) 12.5 MG tablet, Take 12.5 mg by mouth daily., Disp: , Rfl:  .  HYDROcodone-acetaminophen (NORCO) 10-325 MG per tablet, Take 1 tablet by mouth every 6 (six) hours as needed for pain. Reported on 11/03/2015, Disp: , Rfl:  .  metoprolol tartrate (LOPRESSOR) 25 MG tablet, Take 1 tablet (25 mg total) by mouth 2 (two) times daily., Disp: 180 tablet, Rfl: 3 .  Multiple Vitamin (MULTIVITAMIN) capsule, Take 1 capsule by mouth daily.  , Disp: , Rfl:  .  nitroGLYCERIN (NITROSTAT) 0.4 MG SL tablet, Place 1 tablet (0.4 mg total) under the tongue every 5 (five) minutes as needed for chest pain., Disp: 25 tablet, Rfl: 3 .  omeprazole (PRILOSEC) 20 MG capsule, Take 1 capsule by mouth daily. Reported on 11/06/2015, Disp: , Rfl: 2 .  simvastatin (ZOCOR) 20 MG tablet, Take 1 tablet (20 mg total) by mouth at bedtime., Disp: 30 tablet, Rfl: 11 .  valsartan (DIOVAN) 80 MG tablet, Take 1 tablet by mouth daily, Disp: , Rfl: 3

## 2016-05-09 NOTE — Patient Instructions (Signed)
Take Anoro 1 puff daily no matter how you feel Stop smoking Keep using your albuterol on an as-needed basis for shortness of breath We will see you back in 3 months or sooner if needed

## 2016-05-19 DIAGNOSIS — E6609 Other obesity due to excess calories: Secondary | ICD-10-CM | POA: Diagnosis not present

## 2016-05-19 DIAGNOSIS — Z6833 Body mass index (BMI) 33.0-33.9, adult: Secondary | ICD-10-CM | POA: Diagnosis not present

## 2016-05-19 DIAGNOSIS — Z1389 Encounter for screening for other disorder: Secondary | ICD-10-CM | POA: Diagnosis not present

## 2016-05-19 DIAGNOSIS — J069 Acute upper respiratory infection, unspecified: Secondary | ICD-10-CM | POA: Diagnosis not present

## 2016-05-19 DIAGNOSIS — J441 Chronic obstructive pulmonary disease with (acute) exacerbation: Secondary | ICD-10-CM | POA: Diagnosis not present

## 2016-05-24 ENCOUNTER — Other Ambulatory Visit: Payer: Self-pay | Admitting: Acute Care

## 2016-05-24 DIAGNOSIS — F17210 Nicotine dependence, cigarettes, uncomplicated: Secondary | ICD-10-CM

## 2016-05-30 ENCOUNTER — Encounter: Payer: Self-pay | Admitting: Internal Medicine

## 2016-05-30 ENCOUNTER — Ambulatory Visit (INDEPENDENT_AMBULATORY_CARE_PROVIDER_SITE_OTHER): Payer: Medicare Other | Admitting: Internal Medicine

## 2016-05-30 VITALS — BP 120/74 | HR 59 | Ht 66.0 in | Wt 206.0 lb

## 2016-05-30 DIAGNOSIS — J438 Other emphysema: Secondary | ICD-10-CM | POA: Diagnosis not present

## 2016-05-30 DIAGNOSIS — I10 Essential (primary) hypertension: Secondary | ICD-10-CM | POA: Diagnosis not present

## 2016-05-30 DIAGNOSIS — I25118 Atherosclerotic heart disease of native coronary artery with other forms of angina pectoris: Secondary | ICD-10-CM

## 2016-05-30 DIAGNOSIS — F17200 Nicotine dependence, unspecified, uncomplicated: Secondary | ICD-10-CM | POA: Diagnosis not present

## 2016-05-30 DIAGNOSIS — E782 Mixed hyperlipidemia: Secondary | ICD-10-CM

## 2016-05-30 NOTE — Patient Instructions (Signed)
Your physician wants you to follow-up in: ONE YEAR with Dr. Hilty. You will receive a reminder letter in the mail two months in advance. If you don't receive a letter, please call our office to schedule the follow-up appointment.  

## 2016-05-30 NOTE — Progress Notes (Signed)
OFFICE NOTE  Chief Complaint:  Cough, congestion  Primary Care Physician: Glo Herring., MD  HPI:  [name]  is a 66 year old gentleman with a history of hypertension, depression, COPD, sleep apnea, GERD and dyslipidemia. He also has mild to moderate coronary disease with a 40% proximal to mid-LAD lesion in 2006. He has some mild aortic insufficiency on echocardiogram, and that was repeated in October of 2012 and showed a preserved LVEF of greater than 55%, normal chamber size and function, mild mitral and aortic insufficiency, and mild to moderate tricuspid insufficiency. There was also mild pulmonary arterial hypertension, RVSP of 34 mmHg which is actually borderline. He recently underwent right knee surgery and has been recovering from this. He did seem to tolerate the surgery well and it was under general anesthesia. However since surgery he has noted some increasing chest tightness and pain. The symptoms are mostly substernal and radiates to the left chest and occasionally to the left arm. It seemed to occur more with exertion but can occur at rest. He does not note any change when using his inhaler. He does report occasional wheezing still related to his COPD, which is worse at night and does improve with his inhaler. His chest pain is increasing with intensity and seems to be coming more frequently.  Unfortunately continues to smoke and is not yet ready to quit because he is around too many people that smoke.  He is referred for a nuclear stress test which are performed on 03/06/2013. This study was negative for ischemia. He continues to report some shortness of breath with exertion but not as significant. He has had a number of episodes of shortness of breath which I believe are exacerbations of chronic bronchitis and COPD. He only uses her rescue inhaler but fairly frequently. He continues to smoke but has cut back some. I referred him to see Dr. Lake Bells who diagnosed him with  COPD stage C, he has been started on Spiriva with an improvement in his shortness of breath. He also has a albuterol inhaler to use as needed. Generally he feels is doing well.  [name] presents today for follow-up. Since I last saw him, he underwent a sleep study which was mildly abnormal although CPAP was not recommended. He tells me that he's been having some worsening chest discomfort and more significantly short of breath when doing activities such as walking up stairs or normal chores. He says his symptoms improved fairly quickly with resting. He denies any worsening productive cough or sputum production. He's been a little bit wheezy. He continues to use his inhalers as prescribed by his pulmonologist. His last stress test was 2 years ago and negative for ischemia. EKG today shows sinus bradycardia at 56 without ischemic changes.  [name] returns today for follow-up. His stress test was negative for ischemia and showed normal LV function. I suspect more of her symptoms are related to COPD. He has not been as active recently and may be somewhat deconditioned. He probably benefit from more walking and her pulmonary rehabilitation. He's seen pulmonary and they recently did a repeat CT scan. Would not pursue further cardiac workup at this time but will continue to work on risk factor modification.  05/31/2015  [name] was seen today in follow-up. Over the past 2 weeks she reports upper respiratory infection. He saw his PCP and was told to take Mucinex. He reports no significant improvement. He still has a bronchitic type cough as well  as some mild wheezing. He reports some nasal congestion. I advised him that he may wish to follow-up with his PCP regarding this. A cardiac standpoint he denies any chest pain. He does report some improvement in his shortness of breath after starting on Anoro Ellipta. He is still not been able to stop smoking. He is followed by Dr. Lake Bells in pulmonary.  PMHx:    Past Medical History:  Diagnosis Date  . Allergic rhinitis   . Anxiety and depression   . Asthma   . CAD (coronary artery disease)    mild to mod - LAD lesion in 2006  . Chronic low back pain   . COPD (chronic obstructive pulmonary disease) ([place])   . Dyslipidemia   . Easy fatigability   . GERD (gastroesophageal reflux disease)   . Hearing loss   . Hemorrhoids   . History of nuclear stress test 08/2009   bruce protocol; negative for ischemia  . Hyperplastic colon polyp 12/28/10  . Hypertension   . IBS (irritable bowel syndrome)   . 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 Mitral regurgitation   . OSA on CPAP   . Tubular adenoma 12/28/10    Past Surgical History:  Procedure Laterality Date  . CHOLECYSTECTOMY    . COLONOSCOPY  2007   friable anal canal, hyperplastic polyp  . COLONOSCOPY  12/28/2010   Dr. Gala Romney- tubular adenoma, hyperplastic polyp  . COLONOSCOPY N/A 01/16/2015   Rourk: anal canal and internal hemorrhois. next surveillance colonoscopy 12/2019  . EAR PINNA RECONSTRUCTION W/ RIB GRAFT     right  . ESOPHAGOGASTRODUODENOSCOPY     multiple dilations, last EGD 2007 showed small hh, adenomatous appearing gastric mucosa in body but biopsies benign. SB bx negative for Celiac.  Marland Kitchen ESOPHAGOGASTRODUODENOSCOPY  12/28/2010   Dr. Gala Romney- normal esophagus s/p dilationpatchy erythema and erosions.  Marland Kitchen FOOT SURGERY     right  . INGUINAL HERNIA REPAIR     left  . LUNG BIOPSY  2009   right  . MALONEY DILATION  12/28/2010   Procedure: Venia Minks DILATION;  Surgeon: Daneil Dolin, MD;  Location: [facility];  Service: Endoscopy;  Laterality: N/A;  . TONSILLECTOMY    . TRANSTHORACIC ECHOCARDIOGRAM  02/2011   EF =>55%, normal chamber size & function; mild mitral and aortic insuff, mild-mod tricuspid insuff; mild pulm htn with rsvp of 52mmHg    FAMHx:  Family History  Problem Relation Age of Onset  . Colon cancer Neg Hx   . Liver disease Neg Hx   . Inflammatory bowel disease Neg Hx    . Heart disease Father 35    etoh/breathing problmes  . GI problems Mother   . Hyperlipidemia Mother   . Heart attack Maternal Grandmother     also valvular problems  . Heart attack Maternal Grandfather   . Diabetes Paternal Grandmother   . Heart Problems Paternal Grandfather   . Heart attack Brother     stenting x3, also cancer  . Skin cancer Brother   . Hypertension Brother   . Hypertension Sister   . COPD Mother   . COPD Father   . COPD Paternal Grandmother     SOCHx:   reports that he has been smoking Cigarettes.  He started smoking about 38 years ago. He has a 37.00 pack-year smoking history. He has never used smokeless tobacco. He reports that he does not drink alcohol or use drugs.  ALLERGIES:  Allergies  Allergen Reactions  . Bee Venom  Swelling    ROS: Pertinent items noted in HPI and remainder of comprehensive ROS otherwise negative.  HOME MEDS: Current Outpatient Prescriptions  Medication Sig Dispense Refill  . albuterol (PROVENTIL HFA;VENTOLIN HFA) 108 (90 BASE) MCG/ACT inhaler Inhale 2 puffs into the lungs every 6 (six) hours as needed for wheezing.    Marland Kitchen albuterol (PROVENTIL) 2 MG tablet Reported on 11/06/2015  2  . ALPRAZolam (XANAX) 1 MG tablet Take 1 mg by mouth at bedtime as needed.      Marland Kitchen aspirin 81 MG tablet Take 81 mg by mouth daily.      . fenofibrate (TRICOR) 145 MG tablet Take 145 mg by mouth daily.      . fish oil-omega-3 fatty acids 1000 MG capsule Take 2 g by mouth daily.      Marland Kitchen glucosamine-chondroitin 500-400 MG tablet Take 1 tablet by mouth 2 (two) times daily.    . hydrochlorothiazide (HYDRODIURIL) 12.5 MG tablet Take 12.5 mg by mouth daily.    Marland Kitchen HYDROcodone-acetaminophen (NORCO) 10-325 MG per tablet Take 1 tablet by mouth every 6 (six) hours as needed for pain. Reported on 11/03/2015    . metoprolol tartrate (LOPRESSOR) 25 MG tablet Take 1 tablet (25 mg total) by mouth 2 (two) times daily. 180 tablet 3  . Multiple Vitamin (MULTIVITAMIN) capsule  Take 1 capsule by mouth daily.      . nitroGLYCERIN (NITROSTAT) 0.4 MG SL tablet Place 1 tablet (0.4 mg total) under the tongue every 5 (five) minutes as needed for chest pain. 25 tablet 3  . omeprazole (PRILOSEC) 20 MG capsule Take 1 capsule by mouth daily. Reported on 11/06/2015  2  . simvastatin (ZOCOR) 20 MG tablet Take 1 tablet (20 mg total) by mouth at bedtime. 30 tablet 11  . umeclidinium-vilanterol (ANORO ELLIPTA) 62.5-25 MCG/INH AEPB Inhale 1 puff into the lungs daily. 1 each 4  . valsartan (DIOVAN) 80 MG tablet Take 1 tablet by mouth daily  3   No current facility-administered medications for this visit.     LABS/IMAGING: No results found for this or any previous visit (from the past 48 hour(s)). No results found.  VITALS: BP 120/74 (BP Location: Left Arm, Patient Position: Sitting, Cuff Size: Normal)   Pulse (!) 59   Ht 5\' 6"  (1.676 m)   Wt 206 lb (93.4 kg)   BMI 33.25 kg/m   EXAM: General appearance: alert, no distress and coughing Neck: no carotid bruit and no JVD Lungs: clear to auscultation bilaterally Heart: regular rate and rhythm Abdomen: soft, non-tender; bowel sounds normal; no masses,  no organomegaly Extremities: extremities normal, atraumatic, no cyanosis or edema Pulses: 2+ and symmetric Skin: Skin color, texture, turgor normal. No rashes or lesions Neurologic: Grossly normal Psych: Pleasant  EKG: Sinus bradycardia 59  ASSESSMENT: 1. Exertional chest pain and progressive dyspnea on exertion - low risk Myoview with normal LV function 2. Known coronary artery disease with a 40% LAD stenosis in 2006 3. Ongoing tobacco abuse 4. COPD  5. Mild aortic and mitral insufficiency - no change in murmur intensity 6. Dyslipidemia - recently checked by PCP and at goal 7. Hypertension - at goal on current hypertensives, no changes 8. Obstructive sleep apnea on CPAP   PLAN: 1.   [name] had a recent viral URI and is not significantly improved. Advised to  follow-up PCP Z may need antibiotics. He denies any chest pain. He reports his shortness of breath is improved somewhat after starting on Anor Ellipta. Unfortunately continues  to smoke. Blood pressure appears at goal. [facility] request labs from his primary care provider. Follow-up with me annually or sooner as necessary.  Pixie Casino, MD, [facility], [facility] Attending Cardiologist [place] 05/30/2016, 10:19 AM

## 2016-06-01 ENCOUNTER — Other Ambulatory Visit: Payer: Self-pay

## 2016-06-01 MED ORDER — SIMVASTATIN 20 MG PO TABS: 20.0000 mg | ORAL_TABLET | Freq: Every day | ORAL | 11 refills | Status: DC

## 2016-06-02 ENCOUNTER — Ambulatory Visit (INDEPENDENT_AMBULATORY_CARE_PROVIDER_SITE_OTHER)
Admission: RE | Admit: 2016-06-02 | Discharge: 2016-06-02 | Disposition: A | Discharge status: Home / Self Care | Payer: Medicare Other | Source: Ambulatory Visit | Attending: Acute Care | Admitting: Acute Care

## 2016-06-02 DIAGNOSIS — F17210 Nicotine dependence, cigarettes, uncomplicated: Secondary | ICD-10-CM | POA: Diagnosis not present

## 2016-06-02 DIAGNOSIS — Z87891 Personal history of nicotine dependence: Secondary | ICD-10-CM | POA: Diagnosis not present

## 2016-06-02 DIAGNOSIS — J432 Centrilobular emphysema: Secondary | ICD-10-CM | POA: Diagnosis not present

## 2016-06-02 DIAGNOSIS — Z136 Encounter for screening for cardiovascular disorders: Secondary | ICD-10-CM | POA: Diagnosis not present

## 2016-06-15 DIAGNOSIS — J329 Chronic sinusitis, unspecified: Secondary | ICD-10-CM | POA: Diagnosis not present

## 2016-06-15 DIAGNOSIS — M2550 Pain in unspecified joint: Secondary | ICD-10-CM | POA: Diagnosis not present

## 2016-06-15 DIAGNOSIS — E669 Obesity, unspecified: Secondary | ICD-10-CM | POA: Diagnosis not present

## 2016-06-15 DIAGNOSIS — G894 Chronic pain syndrome: Secondary | ICD-10-CM | POA: Diagnosis not present

## 2016-06-15 DIAGNOSIS — J449 Chronic obstructive pulmonary disease, unspecified: Secondary | ICD-10-CM | POA: Diagnosis not present

## 2016-06-15 DIAGNOSIS — Z6832 Body mass index (BMI) 32.0-32.9, adult: Secondary | ICD-10-CM | POA: Diagnosis not present

## 2016-06-15 DIAGNOSIS — E6609 Other obesity due to excess calories: Secondary | ICD-10-CM | POA: Diagnosis not present

## 2016-06-15 DIAGNOSIS — F330 Major depressive disorder, recurrent, mild: Secondary | ICD-10-CM | POA: Diagnosis not present

## 2016-06-15 DIAGNOSIS — F419 Anxiety disorder, unspecified: Secondary | ICD-10-CM | POA: Diagnosis not present

## 2016-06-24 ENCOUNTER — Telehealth: Payer: Self-pay | Admitting: Acute Care

## 2016-06-24 DIAGNOSIS — F17210 Nicotine dependence, cigarettes, uncomplicated: Secondary | ICD-10-CM

## 2016-06-24 NOTE — Telephone Encounter (Signed)
I have called [name] the results of his low-dose screening CT. His scan was read as a lung RADS 2, indicating nodules that are benign in appearance and behavior. Recommendation per radiology is for repeat annual screening with low-dose CT in 12 months. We will order and schedule the scan for January 2019. I also informed [name] of the incidental finding of coronary artery atherosclerosis, and aortic atherosclerosis. He is currently taking simvastatin, aspirin 81 mg daily and omega-3 fatty acids for elevated cholesterol. He has his cholesterol and triglycerides checked annually by his primary care physician. I told him I would send a copy of this results to his primary care provider for completeness of his medical record. He verbalized understanding of the above and had no further questions at completion of the call.

## 2016-07-03 ENCOUNTER — Other Ambulatory Visit: Payer: Self-pay | Admitting: Internal Medicine

## 2016-08-03 ENCOUNTER — Ambulatory Visit (INDEPENDENT_AMBULATORY_CARE_PROVIDER_SITE_OTHER): Payer: Medicare Other | Admitting: Pulmonary Disease

## 2016-08-03 ENCOUNTER — Encounter: Payer: Self-pay | Admitting: Pulmonary Disease

## 2016-08-03 ENCOUNTER — Other Ambulatory Visit (INDEPENDENT_AMBULATORY_CARE_PROVIDER_SITE_OTHER): Payer: Medicare Other

## 2016-08-03 VITALS — BP 132/66 | HR 66 | Ht 66.0 in | Wt 204.0 lb

## 2016-08-03 DIAGNOSIS — F17200 Nicotine dependence, unspecified, uncomplicated: Secondary | ICD-10-CM

## 2016-08-03 DIAGNOSIS — J438 Other emphysema: Secondary | ICD-10-CM | POA: Diagnosis not present

## 2016-08-03 DIAGNOSIS — G4733 Obstructive sleep apnea (adult) (pediatric): Secondary | ICD-10-CM | POA: Diagnosis not present

## 2016-08-03 DIAGNOSIS — R5383 Other fatigue: Secondary | ICD-10-CM

## 2016-08-03 DIAGNOSIS — R5382 Chronic fatigue, unspecified: Secondary | ICD-10-CM | POA: Diagnosis not present

## 2016-08-03 LAB — CBC WITH DIFFERENTIAL/PLATELET
BASOS ABS: 0.1 10*3/uL (ref 0.0–0.1)
BASOS PCT: 0.6 % (ref 0.0–3.0)
EOS PCT: 4.2 % (ref 0.0–5.0)
Eosinophils Absolute: 0.4 10*3/uL (ref 0.0–0.7)
HCT: 46.9 % (ref 39.0–52.0)
Hemoglobin: 16.1 g/dL (ref 13.0–17.0)
LYMPHS ABS: 2.9 10*3/uL (ref 0.7–4.0)
Lymphocytes Relative: 27.7 % (ref 12.0–46.0)
MCHC: 34.3 g/dL (ref 30.0–36.0)
MCV: 92.4 fl (ref 78.0–100.0)
Monocytes Absolute: 0.9 10*3/uL (ref 0.1–1.0)
Monocytes Relative: 8.4 % (ref 3.0–12.0)
NEUTROS ABS: 6.2 10*3/uL (ref 1.4–7.7)
NEUTROS PCT: 59.1 % (ref 43.0–77.0)
PLATELETS: 276 10*3/uL (ref 150.0–400.0)
RBC: 5.07 Mil/uL (ref 4.22–5.81)
RDW: 15 % (ref 11.5–15.5)
WBC: 10.6 10*3/uL — ABNORMAL HIGH (ref 4.0–10.5)

## 2016-08-03 LAB — TSH: TSH: 2.08 u[IU]/mL (ref 0.35–4.50)

## 2016-08-03 NOTE — Patient Instructions (Signed)
We will call you with the results of today's blood test Stop smoking Read the sleep hygiene sheet we provided you with, I recommend that you keep all animals out of the bedroom when you're sleeping Keep taking Anoro 1 puff daily We will see you back in 6 months or sooner if needed

## 2016-08-03 NOTE — Assessment & Plan Note (Signed)
He has been more fatigued recently. He carries a diagnosis of obstructive sleep apnea but he tells me that a polysomnogram a year ago suggested that he did not need CPAP. I do not have record of this test, apparently it was arranged by his primary care physician's office.  He has no findings on physical exam today to suggest a cause for his fatigue other than likelihood of obstructive sleep apnea is high and wheezing related to his COPD.  Plan: Check CBC Check TSH Track down records a polysomnogram Counseled on the significance of good sleep hygiene

## 2016-08-03 NOTE — Progress Notes (Signed)
Subjective:    Patient ID: [name], male    DOB: 1950-12-25, 66 y.o.   MRN: [id number]  Synopsis: First evaluated by [facility] in 2015 for evaluation of COPD.  He was still smoking as of 04/2016, started smoking age 41 and the most he has smoked is 1ppd.  HPI Chief Complaint  Patient presents with  . Follow-up    pt states he is doing well, tolerating anoro well. CAT score 26.   Toryn has been doing well.  He didn't get an exacerbation this year.  No flu.    He still has some dyspnea, on exertion.  He says that he doesn't have energy and feels drained all the time.  He says that he wonders if it is related to his beta-blocker as he says that his HR will be as low as 50. He has been fatigued since then.    His sleep isn't great, he says that he has a cat in the bed that wakes him up multiple times per night.    He has OSA, his last polysomnogram was over a year ago and he was told he didn't need CPAP.   He is still smoking cigarettes regularly.  He has cut back significantly and is now smoking 5 cigarettes a day.    Past Medical History:  Diagnosis Date  . Allergic rhinitis   . Anxiety and depression   . Asthma   . CAD (coronary artery disease)    mild to mod - LAD lesion in 2006  . Chronic low back pain   . COPD (chronic obstructive pulmonary disease) ([place])   . Dyslipidemia   . Easy fatigability   . GERD (gastroesophageal reflux disease)   . Hearing loss   . Hemorrhoids   . History of nuclear stress test 08/2009   bruce protocol; negative for ischemia  . Hyperplastic colon polyp 12/28/10  . Hypertension   . IBS (irritable bowel syndrome)   . 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 Mitral regurgitation   . OSA on CPAP   . Tubular adenoma 12/28/10      Review of Systems  Constitutional: Negative for chills, fatigue and fever.  HENT: Negative for postnasal drip, rhinorrhea and sinus pain.   Respiratory: Positive for wheezing. Negative for cough and  shortness of breath.   Cardiovascular: Negative for chest pain, palpitations and leg swelling.       Objective:   Physical Exam Vitals:   08/03/16 1124  BP: 132/66  Pulse: 66  SpO2: 100%  Weight: 204 lb (92.5 kg)  Height: 5\' 6"  (1.676 m)   RA  Gen: well appearing HENT: OP clear, TM's clear, neck supple PULM: wheezing bilaterally B, normal percussion CV: RRR, no mgr, trace edema GI: BS+, soft, nontender Derm: no cyanosis or rash Psyche: normal mood and affect    Spirometry: 2015 simple spirometry showed ratio 65% FEV1 2.45 L 70% predicted  Records from cardiology reviewed where his blood pressure medicines were managed     Assessment & Plan:  Fatigue He has been more fatigued recently. He carries a diagnosis of obstructive sleep apnea but he tells me that a polysomnogram a year ago suggested that he did not need CPAP. I do not have record of this test, apparently it was arranged by his primary care physician's office.  He has no findings on physical exam today to suggest a cause for his fatigue other than likelihood of obstructive sleep apnea is high and wheezing  related to his COPD.  Plan: Check CBC Check TSH Track down records a polysomnogram Counseled on the significance of good sleep hygiene  Tobacco use disorder Counseled at length today to quit smoking. He has cut back so he was congratulated on this. Continue screening CT scans annually.  Other emphysema ([place]) Lung function testing in 2015 showed mild airflow obstruction. He is wheezing on physical exam today. He was counseled today on the fact that continued tobacco use will lead to worsening COPD symptoms and disease overall.  Plan: Counseled to quit smoking Continue Anoro  Obstructive sleep apnea Request records of polysomnogram    Current Outpatient Prescriptions:  .  albuterol (PROVENTIL HFA;VENTOLIN HFA) 108 (90 BASE) MCG/ACT inhaler, Inhale 2 puffs into the lungs every 6 (six) hours as needed  for wheezing., Disp: , Rfl:  .  albuterol (PROVENTIL) 2 MG tablet, Reported on 11/06/2015, Disp: , Rfl: 2 .  ALPRAZolam (XANAX) 1 MG tablet, Take 1 mg by mouth at bedtime as needed.  , Disp: , Rfl:  .  aspirin 81 MG tablet, Take 81 mg by mouth daily.  , Disp: , Rfl:  .  fenofibrate (TRICOR) 145 MG tablet, Take 145 mg by mouth daily.  , Disp: , Rfl:  .  fish oil-omega-3 fatty acids 1000 MG capsule, Take 2 g by mouth daily.  , Disp: , Rfl:  .  glucosamine-chondroitin 500-400 MG tablet, Take 1 tablet by mouth 2 (two) times daily., Disp: , Rfl:  .  hydrochlorothiazide (HYDRODIURIL) 12.5 MG tablet, Take 12.5 mg by mouth daily., Disp: , Rfl:  .  HYDROcodone-acetaminophen (NORCO) 10-325 MG per tablet, Take 1 tablet by mouth every 6 (six) hours as needed for pain. Reported on 11/03/2015, Disp: , Rfl:  .  metoprolol tartrate (LOPRESSOR) 25 MG tablet, Take 1 tablet (25 mg total) by mouth 2 (two) times daily., Disp: 180 tablet, Rfl: 3 .  Multiple Vitamin (MULTIVITAMIN) capsule, Take 1 capsule by mouth daily.  , Disp: , Rfl:  .  nitroGLYCERIN (NITROSTAT) 0.4 MG SL tablet, Place 1 tablet (0.4 mg total) under the tongue every 5 (five) minutes as needed for chest pain., Disp: 25 tablet, Rfl: 3 .  omeprazole (PRILOSEC) 20 MG capsule, Take 1 capsule by mouth daily. Reported on 11/06/2015, Disp: , Rfl: 2 .  simvastatin (ZOCOR) 20 MG tablet, Take 1 tablet (20 mg total) by mouth at bedtime., Disp: 30 tablet, Rfl: 11 .  umeclidinium-vilanterol (ANORO ELLIPTA) 62.5-25 MCG/INH AEPB, Inhale 1 puff into the lungs daily., Disp: 1 each, Rfl: 4 .  valsartan (DIOVAN) 80 MG tablet, Take 1 tablet by mouth daily, Disp: , Rfl: 3q

## 2016-08-03 NOTE — Assessment & Plan Note (Signed)
Request records of polysomnogram

## 2016-08-03 NOTE — Assessment & Plan Note (Signed)
Counseled at length today to quit smoking. He has cut back so he was congratulated on this. Continue screening CT scans annually.

## 2016-08-03 NOTE — Assessment & Plan Note (Signed)
Lung function testing in 2015 showed mild airflow obstruction. He is wheezing on physical exam today. He was counseled today on the fact that continued tobacco use will lead to worsening COPD symptoms and disease overall.  Plan: Counseled to quit smoking Continue Anoro

## 2016-08-11 DIAGNOSIS — Z6833 Body mass index (BMI) 33.0-33.9, adult: Secondary | ICD-10-CM | POA: Diagnosis not present

## 2016-08-11 DIAGNOSIS — E6609 Other obesity due to excess calories: Secondary | ICD-10-CM | POA: Diagnosis not present

## 2016-08-11 DIAGNOSIS — K1370 Unspecified lesions of oral mucosa: Secondary | ICD-10-CM | POA: Diagnosis not present

## 2016-08-30 DIAGNOSIS — Z720 Tobacco use: Secondary | ICD-10-CM | POA: Diagnosis not present

## 2016-08-30 DIAGNOSIS — K148 Other diseases of tongue: Secondary | ICD-10-CM | POA: Diagnosis not present

## 2016-09-26 DIAGNOSIS — G894 Chronic pain syndrome: Secondary | ICD-10-CM | POA: Diagnosis not present

## 2016-09-26 DIAGNOSIS — K219 Gastro-esophageal reflux disease without esophagitis: Secondary | ICD-10-CM | POA: Diagnosis not present

## 2016-09-26 DIAGNOSIS — Z6833 Body mass index (BMI) 33.0-33.9, adult: Secondary | ICD-10-CM | POA: Diagnosis not present

## 2016-09-26 DIAGNOSIS — J449 Chronic obstructive pulmonary disease, unspecified: Secondary | ICD-10-CM | POA: Diagnosis not present

## 2016-10-04 DIAGNOSIS — K148 Other diseases of tongue: Secondary | ICD-10-CM | POA: Diagnosis not present

## 2016-10-04 DIAGNOSIS — K140 Glossitis: Secondary | ICD-10-CM | POA: Diagnosis not present

## 2016-10-04 DIAGNOSIS — Z720 Tobacco use: Secondary | ICD-10-CM | POA: Diagnosis not present

## 2017-01-11 DIAGNOSIS — E669 Obesity, unspecified: Secondary | ICD-10-CM | POA: Diagnosis not present

## 2017-01-11 DIAGNOSIS — Z6832 Body mass index (BMI) 32.0-32.9, adult: Secondary | ICD-10-CM | POA: Diagnosis not present

## 2017-01-11 DIAGNOSIS — G894 Chronic pain syndrome: Secondary | ICD-10-CM | POA: Diagnosis not present

## 2017-01-11 DIAGNOSIS — J449 Chronic obstructive pulmonary disease, unspecified: Secondary | ICD-10-CM | POA: Diagnosis not present

## 2017-01-30 ENCOUNTER — Ambulatory Visit (INDEPENDENT_AMBULATORY_CARE_PROVIDER_SITE_OTHER): Payer: Medicare Other | Admitting: Pulmonary Disease

## 2017-01-30 ENCOUNTER — Encounter: Payer: Self-pay | Admitting: Pulmonary Disease

## 2017-01-30 VITALS — BP 122/78 | HR 78 | Ht 66.0 in | Wt 200.2 lb

## 2017-01-30 DIAGNOSIS — J438 Other emphysema: Secondary | ICD-10-CM | POA: Diagnosis not present

## 2017-01-30 DIAGNOSIS — F17200 Nicotine dependence, unspecified, uncomplicated: Secondary | ICD-10-CM

## 2017-01-30 DIAGNOSIS — Z23 Encounter for immunization: Secondary | ICD-10-CM | POA: Diagnosis not present

## 2017-01-30 MED ORDER — UMECLIDINIUM-VILANTEROL 62.5-25 MCG/INH IN AEPB: 1.0000 | INHALATION_SPRAY | Freq: Every day | RESPIRATORY_TRACT | Status: DC

## 2017-01-30 NOTE — Addendum Note (Signed)
Addended by: Georjean Mode on: 01/30/2017 03:29 PM   Modules accepted: Orders

## 2017-01-30 NOTE — Progress Notes (Signed)
Subjective:    Patient ID: [name], male    DOB: 04-09-1951, 66 y.o.   MRN: [id number]  Synopsis: First evaluated by [facility] in 2015 for evaluation of COPD.  He was still smoking as of 04/2016, started smoking age 43 and the most he has smoked is 1ppd.  He worked in a dusty environment loading trailers for 29 years.    HPI Chief Complaint  Patient presents with  . Follow-up    Pt has SOB with exertion, coughing up clear thick mucus daily.    [name] says that he has been stable since the last visit.   He is still coughing in the mornings a lot.  He brings up some clear mucus. He is still smoking, he is down to 1/2 pack per day.  His wife is still smoking which is hard for him to avoid. He is still is still using Anoro daily.   He has been using an electronic cigarette lately.  Past Medical History:  Diagnosis Date  . Allergic rhinitis   . Anxiety and depression   . Asthma   . CAD (coronary artery disease)    mild to mod - LAD lesion in 2006  . Chronic low back pain   . COPD (chronic obstructive pulmonary disease) ([place])   . Dyslipidemia   . Easy fatigability   . GERD (gastroesophageal reflux disease)   . Hearing loss   . Hemorrhoids   . History of nuclear stress test 08/2009   bruce protocol; negative for ischemia  . Hyperplastic colon polyp 12/28/10  . Hypertension   . IBS (irritable bowel syndrome)   . 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 Mitral regurgitation   . OSA on CPAP   . Tubular adenoma 12/28/10      Review of Systems  Constitutional: Negative for chills, fatigue and fever.  HENT: Negative for postnasal drip, rhinorrhea and sinus pain.   Respiratory: Positive for wheezing. Negative for cough and shortness of breath.   Cardiovascular: Negative for chest pain, palpitations and leg swelling.       Objective:   Physical Exam Vitals:   01/30/17 1443  BP: 122/78  Pulse: 78  SpO2: 95%  Weight: 200 lb 3.2 oz (90.8 kg)  Height: 5\' 6"   (1.676 m)   RA  Gen: well appearing HENT: OP clear, TM's clear, neck supple PULM: Some wheeze RLL B, normal percussion CV: RRR, no mgr, trace edema GI: BS+, soft, nontender Derm: no cyanosis or rash Psyche: normal mood and affect    Spirometry: 2015 simple spirometry showed ratio 65% FEV1 2.45 L 70% predicted  Chest imaging: 05/2016 Screening CT scan: RADS 2     Assessment & Plan:   Tobacco use disorder  Other emphysema ([place])  Discussion: This is been a stable interval for [name]. He had his lung cancer screening CT this year which did not show evidence of malignancy. He will continue follow-up with them in January 2018. He was counseled on the importance of quitting smoking as soon as possible. He is compliant with his a normal.   Plan: For your COPD: Keep taking Anoro as you are doing Flu shot today  For your tobacco use: Use the electronic cigarette to help wean yourself off of tobacco cigarettes  We will see you back in one year or sooner if needed     Current Outpatient Prescriptions:  .  albuterol (PROVENTIL HFA;VENTOLIN HFA) 108 (90 BASE) MCG/ACT inhaler, Inhale 2 puffs into the  lungs every 6 (six) hours as needed for wheezing., Disp: , Rfl:  .  albuterol (PROVENTIL) 2 MG tablet, Reported on 11/06/2015, Disp: , Rfl: 2 .  ALPRAZolam (XANAX) 1 MG tablet, Take 1 mg by mouth at bedtime as needed.  , Disp: , Rfl:  .  aspirin 81 MG tablet, Take 81 mg by mouth daily.  , Disp: , Rfl:  .  fenofibrate (TRICOR) 145 MG tablet, Take 145 mg by mouth daily.  , Disp: , Rfl:  .  fish oil-omega-3 fatty acids 1000 MG capsule, Take 2 g by mouth daily.  , Disp: , Rfl:  .  glucosamine-chondroitin 500-400 MG tablet, Take 1 tablet by mouth 2 (two) times daily., Disp: , Rfl:  .  hydrochlorothiazide (HYDRODIURIL) 12.5 MG tablet, Take 12.5 mg by mouth daily., Disp: , Rfl:  .  HYDROcodone-acetaminophen (NORCO) 10-325 MG per tablet, Take 1 tablet by mouth every 6 (six) hours as needed for  pain. Reported on 11/03/2015, Disp: , Rfl:  .  metoprolol tartrate (LOPRESSOR) 25 MG tablet, Take 1 tablet (25 mg total) by mouth 2 (two) times daily., Disp: 180 tablet, Rfl: 3 .  Multiple Vitamin (MULTIVITAMIN) capsule, Take 1 capsule by mouth daily.  , Disp: , Rfl:  .  nitroGLYCERIN (NITROSTAT) 0.4 MG SL tablet, Place 1 tablet (0.4 mg total) under the tongue every 5 (five) minutes as needed for chest pain., Disp: 25 tablet, Rfl: 3 .  omeprazole (PRILOSEC) 20 MG capsule, Take 1 capsule by mouth daily. Reported on 11/06/2015, Disp: , Rfl: 2 .  simvastatin (ZOCOR) 20 MG tablet, Take 1 tablet (20 mg total) by mouth at bedtime., Disp: 30 tablet, Rfl: 11 .  umeclidinium-vilanterol (ANORO ELLIPTA) 62.5-25 MCG/INH AEPB, Inhale 1 puff into the lungs daily., Disp: 1 each, Rfl: 4 .  valsartan (DIOVAN) 80 MG tablet, Take 1 tablet by mouth daily, Disp: , Rfl: 3

## 2017-01-30 NOTE — Patient Instructions (Addendum)
For your COPD: Keep taking Anoro as you are doing Flu shot today  For your tobacco use: Use the electronic cigarette to help wean yourself off of tobacco cigarettes  We will see you back in one year or sooner if needed

## 2017-03-07 DIAGNOSIS — E6609 Other obesity due to excess calories: Secondary | ICD-10-CM | POA: Diagnosis not present

## 2017-03-07 DIAGNOSIS — R05 Cough: Secondary | ICD-10-CM | POA: Diagnosis not present

## 2017-03-07 DIAGNOSIS — H9203 Otalgia, bilateral: Secondary | ICD-10-CM | POA: Diagnosis not present

## 2017-03-07 DIAGNOSIS — Z6831 Body mass index (BMI) 31.0-31.9, adult: Secondary | ICD-10-CM | POA: Diagnosis not present

## 2017-03-22 ENCOUNTER — Other Ambulatory Visit: Payer: Self-pay | Admitting: Pulmonary Disease

## 2017-03-27 DIAGNOSIS — J209 Acute bronchitis, unspecified: Secondary | ICD-10-CM | POA: Diagnosis not present

## 2017-03-27 DIAGNOSIS — Z6832 Body mass index (BMI) 32.0-32.9, adult: Secondary | ICD-10-CM | POA: Diagnosis not present

## 2017-03-27 DIAGNOSIS — E6609 Other obesity due to excess calories: Secondary | ICD-10-CM | POA: Diagnosis not present

## 2017-03-27 DIAGNOSIS — J449 Chronic obstructive pulmonary disease, unspecified: Secondary | ICD-10-CM | POA: Diagnosis not present

## 2017-03-28 ENCOUNTER — Ambulatory Visit (HOSPITAL_COMMUNITY)
Admission: RE | Admit: 2017-03-28 | Discharge: 2017-03-28 | Disposition: A | Discharge status: Home / Self Care | Payer: Medicare Other | Source: Ambulatory Visit | Attending: Family Medicine | Admitting: Family Medicine

## 2017-03-28 ENCOUNTER — Other Ambulatory Visit (HOSPITAL_COMMUNITY): Payer: Self-pay | Admitting: Family Medicine

## 2017-03-28 DIAGNOSIS — J449 Chronic obstructive pulmonary disease, unspecified: Secondary | ICD-10-CM | POA: Insufficient documentation

## 2017-03-28 DIAGNOSIS — J209 Acute bronchitis, unspecified: Secondary | ICD-10-CM | POA: Insufficient documentation

## 2017-03-28 DIAGNOSIS — R05 Cough: Secondary | ICD-10-CM | POA: Diagnosis not present

## 2017-04-11 DIAGNOSIS — J329 Chronic sinusitis, unspecified: Secondary | ICD-10-CM | POA: Diagnosis not present

## 2017-04-11 DIAGNOSIS — H6693 Otitis media, unspecified, bilateral: Secondary | ICD-10-CM | POA: Diagnosis not present

## 2017-04-11 DIAGNOSIS — Z6832 Body mass index (BMI) 32.0-32.9, adult: Secondary | ICD-10-CM | POA: Diagnosis not present

## 2017-04-11 DIAGNOSIS — H8110 Benign paroxysmal vertigo, unspecified ear: Secondary | ICD-10-CM | POA: Diagnosis not present

## 2017-06-02 ENCOUNTER — Other Ambulatory Visit: Payer: Self-pay | Admitting: Internal Medicine

## 2017-06-02 ENCOUNTER — Ambulatory Visit (INDEPENDENT_AMBULATORY_CARE_PROVIDER_SITE_OTHER)
Admission: RE | Admit: 2017-06-02 | Discharge: 2017-06-02 | Disposition: A | Discharge status: Home / Self Care | Payer: Medicare Other | Source: Ambulatory Visit | Attending: Acute Care | Admitting: Acute Care

## 2017-06-02 DIAGNOSIS — Z87891 Personal history of nicotine dependence: Secondary | ICD-10-CM | POA: Diagnosis not present

## 2017-06-02 DIAGNOSIS — F17210 Nicotine dependence, cigarettes, uncomplicated: Secondary | ICD-10-CM

## 2017-06-05 ENCOUNTER — Inpatient Hospital Stay: Admission: RE | Admit: 2017-06-05 | Payer: Medicare Other | Source: Ambulatory Visit

## 2017-06-06 ENCOUNTER — Other Ambulatory Visit: Payer: Self-pay | Admitting: Acute Care

## 2017-06-06 DIAGNOSIS — F17210 Nicotine dependence, cigarettes, uncomplicated: Secondary | ICD-10-CM

## 2017-06-06 DIAGNOSIS — Z122 Encounter for screening for malignant neoplasm of respiratory organs: Secondary | ICD-10-CM

## 2017-06-26 DIAGNOSIS — H8113 Benign paroxysmal vertigo, bilateral: Secondary | ICD-10-CM | POA: Diagnosis not present

## 2017-06-26 DIAGNOSIS — H9313 Tinnitus, bilateral: Secondary | ICD-10-CM | POA: Diagnosis not present

## 2017-06-26 DIAGNOSIS — M1388 Other specified arthritis, other site: Secondary | ICD-10-CM | POA: Diagnosis not present

## 2017-06-26 DIAGNOSIS — Z6831 Body mass index (BMI) 31.0-31.9, adult: Secondary | ICD-10-CM | POA: Diagnosis not present

## 2017-06-28 DIAGNOSIS — Z79891 Long term (current) use of opiate analgesic: Secondary | ICD-10-CM | POA: Diagnosis not present

## 2017-07-03 ENCOUNTER — Other Ambulatory Visit: Payer: Self-pay | Admitting: Internal Medicine

## 2017-07-03 NOTE — Telephone Encounter (Signed)
REFILL 

## 2017-07-27 DIAGNOSIS — L570 Actinic keratosis: Secondary | ICD-10-CM | POA: Diagnosis not present

## 2017-07-27 DIAGNOSIS — C44319 Basal cell carcinoma of skin of other parts of face: Secondary | ICD-10-CM | POA: Diagnosis not present

## 2017-07-27 DIAGNOSIS — X32XXXD Exposure to sunlight, subsequent encounter: Secondary | ICD-10-CM | POA: Diagnosis not present

## 2017-07-27 DIAGNOSIS — D225 Melanocytic nevi of trunk: Secondary | ICD-10-CM | POA: Diagnosis not present

## 2017-08-30 ENCOUNTER — Other Ambulatory Visit: Payer: Self-pay | Admitting: Internal Medicine

## 2017-08-30 NOTE — Telephone Encounter (Signed)
REFILL 

## 2017-09-21 DIAGNOSIS — I10 Essential (primary) hypertension: Secondary | ICD-10-CM | POA: Diagnosis not present

## 2017-09-21 DIAGNOSIS — E669 Obesity, unspecified: Secondary | ICD-10-CM | POA: Diagnosis not present

## 2017-09-21 DIAGNOSIS — E6609 Other obesity due to excess calories: Secondary | ICD-10-CM | POA: Diagnosis not present

## 2017-09-21 DIAGNOSIS — J309 Allergic rhinitis, unspecified: Secondary | ICD-10-CM | POA: Diagnosis not present

## 2017-10-25 ENCOUNTER — Other Ambulatory Visit: Payer: Self-pay | Admitting: Internal Medicine

## 2017-10-25 NOTE — Telephone Encounter (Signed)
Rx sent to pharmacy   

## 2017-11-20 ENCOUNTER — Other Ambulatory Visit: Payer: Self-pay | Admitting: Internal Medicine

## 2017-11-30 DIAGNOSIS — Z6832 Body mass index (BMI) 32.0-32.9, adult: Secondary | ICD-10-CM | POA: Diagnosis not present

## 2017-11-30 DIAGNOSIS — R079 Chest pain, unspecified: Secondary | ICD-10-CM | POA: Diagnosis not present

## 2017-11-30 DIAGNOSIS — R55 Syncope and collapse: Secondary | ICD-10-CM | POA: Diagnosis not present

## 2017-11-30 DIAGNOSIS — Z0001 Encounter for general adult medical examination with abnormal findings: Secondary | ICD-10-CM | POA: Diagnosis not present

## 2017-12-14 ENCOUNTER — Encounter: Payer: Self-pay | Admitting: Cardiology

## 2017-12-14 NOTE — Progress Notes (Signed)
Cardiology Office Note  Date: 12/18/2017   ID: FREAD KOTTKE, DOB [birth year], MRN [id number]  PCP: Redmond School, MD  Evaluating Cardiologist: Rozann Lesches, MD   Chief Complaint  Patient presents with  . Cardiac follow-up    History of Present Illness: [name] is a 67 y.o. male former patient of Dr. Debara Pickett last seen in January 2018.  He is referred by Dr. Gerarda Fraction to establish cardiology follow-up in our [facility] practice. I reviewed his records and updated the chart.  He reports no progressive angina symptoms and stable NYHA class II dyspnea.  Within the last several weeks he states that he was having episodes of lightheadedness, could occur while he was sitting down, also when standing quickly.  He had no frank palpitations or syncope.  Lopressor was cut in half by Dr. Gerarda Fraction, he feels somewhat better.  Also reports erectile dysfunction.  Last ischemic evaluation was in 2016 as outlined below.  We are requesting his recent ECG from [place].  Current cardiac regimen includes aspirin, TriCor, Cozaar, Lopressor, Zocor, and as needed nitroglycerin.  We discussed coming off Lopressor completely at this time.  Also recording a blood pressure and heart rate log at home to determine if any additional changes need to be made.  I reviewed his recent lab work.   Past Medical History:  Diagnosis Date  . Allergic rhinitis   . Anxiety and depression   . Asthma   . CAD (coronary artery disease)    Mild to moderate nonobstructive LAD disease 2006  . Chronic low back pain   . COPD (chronic obstructive pulmonary disease) ([place])   . Dyslipidemia   . Essential hypertension   . GERD (gastroesophageal reflux disease)   . Hearing loss   . Hemorrhoids   . Hyperplastic colon polyp 12/28/10  . IBS (irritable bowel syndrome)   . 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)   . Mitral regurgitation   . OSA on CPAP   . Tubular adenoma 12/28/10    Past Surgical History:  Procedure  Laterality Date  . CHOLECYSTECTOMY    . COLONOSCOPY  2007   friable anal canal, hyperplastic polyp  . COLONOSCOPY  12/28/2010   Dr. Gala Romney- tubular adenoma, hyperplastic polyp  . COLONOSCOPY N/A 01/16/2015   Rourk: anal canal and internal hemorrhois. next surveillance colonoscopy 12/2019  . EAR PINNA RECONSTRUCTION W/ RIB GRAFT     right  . ESOPHAGOGASTRODUODENOSCOPY     multiple dilations, last EGD 2007 showed small hh, adenomatous appearing gastric mucosa in body but biopsies benign. SB bx negative for Celiac.  Marland Kitchen ESOPHAGOGASTRODUODENOSCOPY  12/28/2010   Dr. Gala Romney- normal esophagus s/p dilationpatchy erythema and erosions.  Marland Kitchen FOOT SURGERY     right  . INGUINAL HERNIA REPAIR     left  . LUNG BIOPSY  2009   right  . MALONEY DILATION  12/28/2010   Procedure: Venia Minks DILATION;  Surgeon: Daneil Dolin, MD;  Location: [facility];  Service: Endoscopy;  Laterality: N/A;  . TONSILLECTOMY    . TRANSTHORACIC ECHOCARDIOGRAM  02/2011   EF =>55%, normal chamber size & function; mild mitral and aortic insuff, mild-mod tricuspid insuff; mild pulm htn with rsvp of 37mmHg    Current Outpatient Medications  Medication Sig Dispense Refill  . albuterol (PROVENTIL HFA;VENTOLIN HFA) 108 (90 BASE) MCG/ACT inhaler Inhale 2 puffs into the lungs every 6 (six) hours as needed for wheezing.    Marland Kitchen albuterol (PROVENTIL) 2 MG tablet Reported on 11/06/2015  2  .  ALPRAZolam (XANAX) 1 MG tablet Take 1 mg by mouth at bedtime as needed.      Jearl Klinefelter ELLIPTA 62.5-25 MCG/INH AEPB INHALE ONE PUFF BY MOUTH ONCE DAILY 60 each 4  . aspirin 81 MG tablet Take 81 mg by mouth daily.      . fenofibrate (TRICOR) 145 MG tablet Take 145 mg by mouth daily.      . fish oil-omega-3 fatty acids 1000 MG capsule Take 2 g by mouth daily.      Marland Kitchen glucosamine-chondroitin 500-400 MG tablet Take 1 tablet by mouth 2 (two) times daily.    Marland Kitchen HYDROcodone-acetaminophen (NORCO) 10-325 MG per tablet Take 1 tablet by mouth every 6 (six) hours as needed  for pain. Reported on 11/03/2015    . losartan (COZAAR) 50 MG tablet Take 50 mg by mouth daily.    . Multiple Vitamin (MULTIVITAMIN) capsule Take 1 capsule by mouth daily.      . nitroGLYCERIN (NITROSTAT) 0.4 MG SL tablet Place 1 tablet (0.4 mg total) under the tongue every 5 (five) minutes as needed for chest pain. 25 tablet 3  . omeprazole (PRILOSEC) 20 MG capsule Take 1 capsule by mouth daily. Reported on 11/06/2015  2  . simvastatin (ZOCOR) 20 MG tablet Take 1 tablet (20 mg total) by mouth daily at 6 PM. NEED OV. 15 tablet 0   Current Facility-Administered Medications  Medication Dose Route Frequency Provider Last Rate Last Dose  . umeclidinium-vilanterol (ANORO ELLIPTA) 62.5-25 MCG/INH 1 puff  1 puff Inhalation Daily Juanito Doom, MD       Allergies:  Bee venom   Social History: The patient  reports that he has been smoking cigarettes.  He started smoking about 39 years ago. He has a 18.50 pack-year smoking history. He has never used smokeless tobacco. He reports that he does not drink alcohol or use drugs.   ROS:  Please see the history of present illness. Otherwise, complete review of systems is positive for chronic back pain.  All other systems are reviewed and negative.   Physical Exam: VS:  BP 98/70   Pulse 66   Ht 5\' 6"  (1.676 m)   Wt 198 lb (89.8 kg)   SpO2 96%   BMI 31.96 kg/m , BMI Body mass index is 31.96 kg/m.  Wt Readings from Last 3 Encounters:  12/18/17 198 lb (89.8 kg)  01/30/17 200 lb 3.2 oz (90.8 kg)  08/03/16 204 lb (92.5 kg)    General: Patient appears comfortable at rest. HEENT: Conjunctiva and lids normal, oropharynx clear. Neck: Supple, no elevated JVP or carotid bruits, no thyromegaly. Lungs: Clear to auscultation, nonlabored breathing at rest. Cardiac: Regular rate and rhythm, no S3, soft systolic murmur, no pericardial rub. Abdomen: Soft, nontender, bowel sounds present. Extremities: No pitting edema, distal pulses 2+. Skin: Warm and  dry. Musculoskeletal: No kyphosis. Neuropsychiatric: Alert and oriented x3, affect grossly appropriate.  ECG: I personally reviewed the tracing from 05/30/2016 which showed sinus bradycardia.  Recent Labwork:  March 2018: Hemoglobin 16.1, platelets 276, TSH 2.30 June 2017: Cholesterol 141, HDL 39, LDL 79, triglycerides 113  Other Studies Reviewed Today:  Lexiscan Myoview 04/28/2015:  The left ventricular ejection fraction is normal (55-65%).  Nuclear stress EF: 63%.  There was no ST segment deviation noted during stress.  The study is normal.   Normal stress nuclear study with a small, mild, fixed inferior septal defect consistent with thinning; no ischemia; EF 63 with normal wall motion.  Assessment and Plan:  1.  Mild to moderate, nonobstructive LAD disease by cardiac catheterization in 2006.  He reports no active angina symptoms.  Last ischemic assessment was in 2016.  We are requesting his most recent ECG.  At this point we will continue with observation.  No changes made to current cardiac regimen other than discontinuation of Lopressor.  2.  Hyperlipidemia, on TriCor and Zocor.  Recent lipid panel in February showed LDL 79.  He follows with Dr. Gerarda Fraction.  3.  Long-standing tobacco abuse.  Smoking cessation has been discussed.  4.  History of essential hypertension.  Blood pressure is low normal today.  I have asked him to keep a blood pressure and heart rate log for our review.  May need to reduce Cozaar.  Current medicines were reviewed with the patient today.  Disposition: Follow-up in 6 months.  Signed, Satira Sark, MD, [facility] 12/18/2017 11:45 AM    [facility] [place], [place], [place] [address] Phone: [phone]; Fax: [phone]

## 2017-12-18 ENCOUNTER — Encounter: Payer: Self-pay | Admitting: Cardiology

## 2017-12-18 ENCOUNTER — Ambulatory Visit: Payer: Medicare Other | Admitting: Cardiology

## 2017-12-18 VITALS — BP 98/70 | HR 66 | Ht 66.0 in | Wt 198.0 lb

## 2017-12-18 DIAGNOSIS — Z720 Tobacco use: Secondary | ICD-10-CM | POA: Diagnosis not present

## 2017-12-18 DIAGNOSIS — I10 Essential (primary) hypertension: Secondary | ICD-10-CM | POA: Diagnosis not present

## 2017-12-18 DIAGNOSIS — I25119 Atherosclerotic heart disease of native coronary artery with unspecified angina pectoris: Secondary | ICD-10-CM | POA: Diagnosis not present

## 2017-12-18 DIAGNOSIS — E782 Mixed hyperlipidemia: Secondary | ICD-10-CM

## 2017-12-18 NOTE — Patient Instructions (Signed)
Medication Instructions:  Your physician has recommended you make the following change in your medication:   STOP Lopressor  Please continue all other medications as prescribed  Labwork: NONE  Testing/Procedures: NONE  Follow-Up: Your physician wants you to follow-up in: [place]. You will receive a reminder letter in the mail two months in advance. If you don't receive a letter, please call our office to schedule the follow-up appointment.  Any Other Special Instructions Will Be Listed Below (If Applicable).  Your physician has requested that you regularly monitor and record your blood pressure readings at home FOR 2 WEEKS. Please use the same machine at the same time of day to check your readings and record them to bring to your follow-up visit.   If you need a refill on your cardiac medications before your next appointment, please call your pharmacy.

## 2017-12-29 ENCOUNTER — Other Ambulatory Visit: Payer: Self-pay | Admitting: Internal Medicine

## 2018-01-02 ENCOUNTER — Telehealth: Payer: Self-pay

## 2018-01-02 NOTE — Telephone Encounter (Signed)
Patient contacted office with BP readings  7/29 BP 118/84 HR 73  7/30 (AM) BP 136/88 HR 61         (PM) BP 124/81 HR 84  7/31 (AM) BP 127/89 HR 73         (PM) BP 141/91 HR 66  8/1   (AM) BP 130/90 HR 81         (PM) BP 132/90 HR 65  8/2   (AM) BP 133/89 HR 76         (PM) BP 138/88 HR 72  8/3   (AM) BP 132/92 HR 74         (PM) BP 130/80 HR 71  8/4   (AM) BP 128/88 HR 75         (PM) BP 133/88 HR 67  8/5   (AM) BP 135/88 HR 70         (PM) BP 134/85 HR 80  8/6   (AM) BP 131/88 HR 78         (PM) BP 129/86 HR 77  8/7   (AM) BP 119/88 HR 74         (PM) BP 136/88 HR 68  8/8   (AM) BP 127/89 HR 77         (PM) BP 120/84 HR 80  8/9   (AM) BP 130/84 HR 72         (PM) BP 128/54 HR 84

## 2018-01-03 NOTE — Telephone Encounter (Signed)
Noted.  Thank you for obtaining these.  I would continue medications at current doses.  He does not need to continue to check blood pressure regularly at this time.

## 2018-01-04 NOTE — Telephone Encounter (Signed)
Patient notified and verbalized understanding. 

## 2018-01-18 ENCOUNTER — Other Ambulatory Visit: Payer: Self-pay

## 2018-01-19 MED ORDER — SIMVASTATIN 20 MG PO TABS: 20.0000 mg | ORAL_TABLET | Freq: Every day | ORAL | 2 refills | Status: DC

## 2018-01-22 ENCOUNTER — Other Ambulatory Visit: Payer: Self-pay | Admitting: Internal Medicine

## 2018-01-29 DIAGNOSIS — Z08 Encounter for follow-up examination after completed treatment for malignant neoplasm: Secondary | ICD-10-CM | POA: Diagnosis not present

## 2018-01-29 DIAGNOSIS — Z85828 Personal history of other malignant neoplasm of skin: Secondary | ICD-10-CM | POA: Diagnosis not present

## 2018-01-29 DIAGNOSIS — L570 Actinic keratosis: Secondary | ICD-10-CM | POA: Diagnosis not present

## 2018-01-29 DIAGNOSIS — X32XXXD Exposure to sunlight, subsequent encounter: Secondary | ICD-10-CM | POA: Diagnosis not present

## 2018-02-14 ENCOUNTER — Ambulatory Visit: Payer: Medicare Other | Admitting: Pulmonary Disease

## 2018-02-14 ENCOUNTER — Encounter: Payer: Self-pay | Admitting: Pulmonary Disease

## 2018-02-14 VITALS — BP 130/84 | HR 82 | Ht 65.55 in | Wt 197.0 lb

## 2018-02-14 DIAGNOSIS — J432 Centrilobular emphysema: Secondary | ICD-10-CM | POA: Diagnosis not present

## 2018-02-14 DIAGNOSIS — F17210 Nicotine dependence, cigarettes, uncomplicated: Secondary | ICD-10-CM

## 2018-02-14 MED ORDER — UMECLIDINIUM-VILANTEROL 62.5-25 MCG/INH IN AEPB: INHALATION_SPRAY | RESPIRATORY_TRACT | 11 refills | Status: DC

## 2018-02-14 MED ORDER — UMECLIDINIUM-VILANTEROL 62.5-25 MCG/INH IN AEPB: 1.0000 | INHALATION_SPRAY | Freq: Every day | RESPIRATORY_TRACT | 0 refills | Status: DC

## 2018-02-14 MED ORDER — ALBUTEROL SULFATE HFA 108 (90 BASE) MCG/ACT IN AERS: 2.0000 | INHALATION_SPRAY | Freq: Four times a day (QID) | RESPIRATORY_TRACT | 5 refills | Status: DC | PRN

## 2018-02-14 NOTE — Addendum Note (Signed)
Addended by: Len Blalock on: 02/14/2018 10:56 AM   Modules accepted: Orders

## 2018-02-14 NOTE — Patient Instructions (Signed)
Cigarette smoker: Do your best to try to quit smoking altogether Call 1-800-quit-now to get free nicotine replacement from the state of [place]  COPD: Take Anoro 1 puff daily no matter how you feel I am glad you have Artie had a flu shot Practice good hand hygiene Stay active Use albuterol as needed for chest tightness wheezing or shortness of breath  We will see you back in 1 year or sooner if needed

## 2018-02-14 NOTE — Addendum Note (Signed)
Addended by: Maury Dus L on: 02/14/2018 11:00 AM   Modules accepted: Orders

## 2018-02-14 NOTE — Progress Notes (Signed)
Subjective:    Patient ID: [name], male    DOB: 1951-02-12, 67 y.o.   MRN: [id number]  Synopsis: First evaluated by [facility] in 2015 for evaluation of COPD.  [name] was still smoking as of 04/2016, started smoking age 51 and the most [name] has smoked is 1ppd.  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    HPI Chief Complaint  Patient presents with  . Follow-up    SOB with hills   B[place]ard says [name] has been working out in the yard a lot.  [name] hasn't been walking for exercise as much lately and [name] wants to get back in to doing that.  [name] climbs hills some when [name] takes out his trash ( about a 1/10).    No bronchitis or pneumonia since the last visit.    [name] is still smoking 5 to 6 cigarettes a day.  Past Medical History:  Diagnosis Date  . Allergic rhinitis   . Anxiety and depression   . Asthma   . CAD (coronary artery disease)    Mild to moderate nonobstructive LAD disease 2006  . Chronic low back pain   . COPD (chronic obstructive pulmonary disease) ([place])   . Dyslipidemia   . Essential hypertension   . GERD (gastroesophageal reflux disease)   . Hearing loss   . Hemorrhoids   . Hyperplastic colon polyp 12/28/10  . IBS (irritable bowel syndrome)   . 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 Mitral regurgitation   . OSA on CPAP   . Tubular adenoma 12/28/10      Review of Systems  Constitutional: Negative for chills, fatigue and fever.  HENT: Negative for postnasal drip, rhinorrhea and sinus pain.   Respiratory: Positive for wheezing. Negative for cough and shortness of breath.   Cardiovascular: Negative for chest pain, palpitations and leg swelling.       Objective:   Physical Exam Vitals:   02/14/18 1020  BP: 130/84  Pulse: 82  SpO2: 97%  Weight: 197 lb (89.4 kg)  Height: 5' 5.55" (1.665 m)   RA  Gen: well appearing HENT: OP clear, TM's clear, neck supple PULM: Mild wheezing, good air movement B, normal percussion CV: RRR, no  mgr, trace edema GI: BS+, soft, nontender Derm: no cyanosis or rash Psyche: normal mood and affect     Spirometry: 2015 simple spirometry showed ratio 65% FEV1 2.45 L 70% predicted  Chest imaging: 05/2016 Screening CT scan: RADS 24 May 2017 lung cancer screening CT scan RADS 2     Assessment & Plan:   Moderate smoker (20 or less per day)  Centrilobular emphysema ([place])  Discussion: This has been a stable interval for [name] but unfortunately [name] continues to smoke cigarettes.  [name] is at increased risk for lung cancer so [name] needs to continue getting lung cancer screening CT scans.  Plan: Cigarette smoker: Do your best to try to quit smoking altogether Call 1-800-quit-now to get free nicotine replacement from the state of [place]  COPD: Take Anoro 1 puff daily no matter how you feel I am glad you have Artie had a flu shot Practice good hand hygiene Stay active Use albuterol as needed for chest tightness wheezing or shortness of breath  We will see you back in 1 year or sooner if needed   Current Outpatient Medications:  .  albuterol (PROVENTIL HFA;VENTOLIN HFA) 108 (90 BASE) MCG/ACT inhaler, Inhale 2 puffs into the lungs every 6 (  six) hours as needed for wheezing., Disp: , Rfl:  .  ALPRAZolam (XANAX) 1 MG tablet, Take 1 mg by mouth at bedtime as needed.  , Disp: , Rfl:  .  ANORO ELLIPTA 62.5-25 MCG/INH AEPB, INHALE ONE PUFF BY MOUTH ONCE DAILY, Disp: 60 each, Rfl: 4 .  aspirin 81 MG tablet, Take 81 mg by mouth daily.  , Disp: , Rfl:  .  fenofibrate (TRICOR) 145 MG tablet, Take 145 mg by mouth daily.  , Disp: , Rfl:  .  fish oil-omega-3 fatty acids 1000 MG capsule, Take 2 g by mouth daily.  , Disp: , Rfl:  .  glucosamine-chondroitin 500-400 MG tablet, Take 1 tablet by mouth 2 (two) times daily., Disp: , Rfl:  .  HYDROcodone-acetaminophen (NORCO) 10-325 MG per tablet, Take 1 tablet by mouth every 6 (six) hours as needed for pain. Reported on 11/03/2015, Disp: , Rfl:    .  losartan (COZAAR) 50 MG tablet, Take 50 mg by mouth daily., Disp: , Rfl:  .  Multiple Vitamin (MULTIVITAMIN) capsule, Take 1 capsule by mouth daily.  , Disp: , Rfl:  .  nitroGLYCERIN (NITROSTAT) 0.4 MG SL tablet, Place 1 tablet (0.4 mg total) under the tongue every 5 (five) minutes as needed for chest pain., Disp: 25 tablet, Rfl: 3 .  omeprazole (PRILOSEC) 20 MG capsule, Take 1 capsule by mouth daily. Reported on 11/06/2015, Disp: , Rfl: 2 .  simvastatin (ZOCOR) 20 MG tablet, Take 1 tablet (20 mg total) by mouth daily., Disp: 90 tablet, Rfl: 2 .  albuterol (PROVENTIL) 2 MG tablet, Reported on 11/06/2015, Disp: , Rfl: 2  Current Facility-Administered Medications:  .  umeclidinium-vilanterol (ANORO ELLIPTA) 62.5-25 MCG/INH 1 puff, 1 puff, Inhalation, Daily, Juanito Doom, MD

## 2018-02-21 DIAGNOSIS — Z6832 Body mass index (BMI) 32.0-32.9, adult: Secondary | ICD-10-CM | POA: Diagnosis not present

## 2018-02-21 DIAGNOSIS — G894 Chronic pain syndrome: Secondary | ICD-10-CM | POA: Diagnosis not present

## 2018-02-21 DIAGNOSIS — J449 Chronic obstructive pulmonary disease, unspecified: Secondary | ICD-10-CM | POA: Diagnosis not present

## 2018-02-21 DIAGNOSIS — B349 Viral infection, unspecified: Secondary | ICD-10-CM | POA: Diagnosis not present

## 2018-02-21 DIAGNOSIS — E6609 Other obesity due to excess calories: Secondary | ICD-10-CM | POA: Diagnosis not present

## 2018-04-12 DIAGNOSIS — E6609 Other obesity due to excess calories: Secondary | ICD-10-CM | POA: Diagnosis not present

## 2018-04-12 DIAGNOSIS — J019 Acute sinusitis, unspecified: Secondary | ICD-10-CM | POA: Diagnosis not present

## 2018-04-12 DIAGNOSIS — Z6831 Body mass index (BMI) 31.0-31.9, adult: Secondary | ICD-10-CM | POA: Diagnosis not present

## 2018-04-12 DIAGNOSIS — H6993 Unspecified Eustachian tube disorder, bilateral: Secondary | ICD-10-CM | POA: Diagnosis not present

## 2018-04-24 DIAGNOSIS — Z6832 Body mass index (BMI) 32.0-32.9, adult: Secondary | ICD-10-CM | POA: Diagnosis not present

## 2018-04-24 DIAGNOSIS — K4090 Unilateral inguinal hernia, without obstruction or gangrene, not specified as recurrent: Secondary | ICD-10-CM | POA: Diagnosis not present

## 2018-04-24 DIAGNOSIS — F419 Anxiety disorder, unspecified: Secondary | ICD-10-CM | POA: Diagnosis not present

## 2018-04-24 DIAGNOSIS — E6609 Other obesity due to excess calories: Secondary | ICD-10-CM | POA: Diagnosis not present

## 2018-05-25 ENCOUNTER — Ambulatory Visit: Payer: Medicare Other | Admitting: Urology

## 2018-05-25 DIAGNOSIS — Z79891 Long term (current) use of opiate analgesic: Secondary | ICD-10-CM | POA: Diagnosis not present

## 2018-05-25 DIAGNOSIS — N5201 Erectile dysfunction due to arterial insufficiency: Secondary | ICD-10-CM | POA: Diagnosis not present

## 2018-06-04 ENCOUNTER — Ambulatory Visit (HOSPITAL_COMMUNITY)
Admission: RE | Admit: 2018-06-04 | Discharge: 2018-06-04 | Disposition: A | Discharge status: Home / Self Care | Payer: Medicare Other | Source: Ambulatory Visit | Attending: Acute Care | Admitting: Acute Care

## 2018-06-04 DIAGNOSIS — F17210 Nicotine dependence, cigarettes, uncomplicated: Secondary | ICD-10-CM | POA: Diagnosis not present

## 2018-06-04 DIAGNOSIS — Z122 Encounter for screening for malignant neoplasm of respiratory organs: Secondary | ICD-10-CM

## 2018-06-08 ENCOUNTER — Other Ambulatory Visit: Payer: Self-pay | Admitting: Acute Care

## 2018-06-08 DIAGNOSIS — J449 Chronic obstructive pulmonary disease, unspecified: Secondary | ICD-10-CM | POA: Diagnosis not present

## 2018-06-08 DIAGNOSIS — Z122 Encounter for screening for malignant neoplasm of respiratory organs: Secondary | ICD-10-CM

## 2018-06-08 DIAGNOSIS — K219 Gastro-esophageal reflux disease without esophagitis: Secondary | ICD-10-CM | POA: Diagnosis not present

## 2018-06-08 DIAGNOSIS — J329 Chronic sinusitis, unspecified: Secondary | ICD-10-CM | POA: Diagnosis not present

## 2018-06-08 DIAGNOSIS — Z6832 Body mass index (BMI) 32.0-32.9, adult: Secondary | ICD-10-CM | POA: Diagnosis not present

## 2018-06-08 DIAGNOSIS — F17210 Nicotine dependence, cigarettes, uncomplicated: Secondary | ICD-10-CM

## 2018-06-20 NOTE — Progress Notes (Signed)
  Cardiology Office Note  Date: 06/21/2018   ID: Shakai S Fichera, DOB 12/25/1950, MRN [id number]  PCP: Fusco, Lawrence, MD  Primary Cardiologist: Samuel McDowell, MD   Chief Complaint  Patient presents with  . Coronary Artery Disease    History of Present Illness: [name] is a 68 y.o. male that I met in July 2019.  He presents for a routine visit.  He states that he has been under a lot of stress at home.  Reports blood pressure elevations that have been fairly consistent, I reviewed home checks that he brought in today.  He had been taken off Lopressor around the time that I saw him previously due to symptomatic bradycardia.  He otherwise reports compliance with Cozaar.  He has had occasional angina symptoms, no nitroglycerin use.  He continues to follow with Dr. Fusco.  We discussed smoking cessation, he has not been motivated to quit.  Past Medical History:  Diagnosis Date  . Allergic rhinitis   . Anxiety and depression   . Asthma   . CAD (coronary artery disease)    Mild to moderate nonobstructive LAD disease 2006  . Chronic low back pain   . COPD (chronic obstructive pulmonary disease) (HCC)   . Dyslipidemia   . Essential hypertension   . GERD (gastroesophageal reflux disease)   . Hearing loss   . Hemorrhoids   . Hyperplastic colon polyp 12/28/10  . IBS (irritable bowel syndrome)   . Langerhan's cell histiocytosis (HCC)   . Mitral regurgitation   . OSA on CPAP   . Tubular adenoma 12/28/10    Past Surgical History:  Procedure Laterality Date  . CHOLECYSTECTOMY    . COLONOSCOPY  2007   friable anal canal, hyperplastic polyp  . COLONOSCOPY  12/28/2010   Dr. Rourk- tubular adenoma, hyperplastic polyp  . COLONOSCOPY N/A 01/16/2015   Rourk: anal canal and internal hemorrhois. next surveillance colonoscopy 12/2019  . EAR PINNA RECONSTRUCTION W/ RIB GRAFT     right  . ESOPHAGOGASTRODUODENOSCOPY     multiple dilations, last EGD 2007 showed small hh, adenomatous  appearing gastric mucosa in body but biopsies benign. SB bx negative for Celiac.  . ESOPHAGOGASTRODUODENOSCOPY  12/28/2010   Dr. Rourk- normal esophagus s/p dilationpatchy erythema and erosions.  . FOOT SURGERY     right  . INGUINAL HERNIA REPAIR     left  . LUNG BIOPSY  2009   right  . MALONEY DILATION  12/28/2010   Procedure: MALONEY DILATION;  Surgeon: Robert M Rourk, MD;  Location: [facility];  Service: Endoscopy;  Laterality: N/A;  . TONSILLECTOMY    . TRANSTHORACIC ECHOCARDIOGRAM  02/2011   EF =>55%, normal chamber size & function; mild mitral and aortic insuff, mild-mod tricuspid insuff; mild pulm htn with rsvp of 34mmHg    Current Outpatient Medications  Medication Sig Dispense Refill  . albuterol (PROVENTIL HFA;VENTOLIN HFA) 108 (90 Base) MCG/ACT inhaler Inhale 2 puffs into the lungs every 6 (six) hours as needed for wheezing. 1 Inhaler 5  . albuterol (PROVENTIL) 2 MG tablet Reported on 11/06/2015  2  . ALPRAZolam (XANAX) 1 MG tablet Take 1 mg by mouth at bedtime as needed.      . aspirin 81 MG tablet Take 81 mg by mouth daily.      . fenofibrate (TRICOR) 145 MG tablet Take 145 mg by mouth daily.      . fish oil-omega-3 fatty acids 1000 MG capsule Take 2 g by mouth daily.      .   glucosamine-chondroitin 500-400 MG tablet Take 1 tablet by mouth 2 (two) times daily.    Marland Kitchen HYDROcodone-acetaminophen (NORCO) 10-325 MG per tablet Take 1 tablet by mouth every 6 (six) hours as needed for pain. Reported on 11/03/2015    . Multiple Vitamin (MULTIVITAMIN) capsule Take 1 capsule by mouth daily.      . nitroGLYCERIN (NITROSTAT) 0.4 MG SL tablet Place 1 tablet (0.4 mg total) under the tongue every 5 (five) minutes as needed for chest pain. 25 tablet 3  . omeprazole (PRILOSEC) 20 MG capsule Take 1 capsule by mouth daily. Reported on 11/06/2015  2  . simvastatin (ZOCOR) 20 MG tablet Take 1 tablet (20 mg total) by mouth daily. 90 tablet 2  . umeclidinium-vilanterol (ANORO ELLIPTA) 62.5-25 MCG/INH  AEPB Inhale 1 puff into the lungs daily. 2 each 0  . losartan (COZAAR) 100 MG tablet Take 1 tablet (100 mg total) by mouth daily. 90 tablet 2   No current facility-administered medications for this visit.    Allergies:  Bee venom   Social History: The patient  reports that he has been smoking cigarettes. He started smoking about 40 years ago. He has a 18.50 pack-year smoking history. He has never used smokeless tobacco. He reports that he does not drink alcohol or use drugs.   ROS:  Please see the history of present illness. Otherwise, complete review of systems is positive for easy bruising.  All other systems are reviewed and negative.   Physical Exam: VS:  BP (!) 145/84   Pulse 75   Ht 5' 6" (1.676 m)   Wt 204 lb 9.6 oz (92.8 kg)   SpO2 98%   BMI 33.02 kg/m , BMI Body mass index is 33.02 kg/m.  Wt Readings from Last 3 Encounters:  06/21/18 204 lb 9.6 oz (92.8 kg)  02/14/18 197 lb (89.4 kg)  12/18/17 198 lb (89.8 kg)    General: Patient appears comfortable at rest. HEENT: Conjunctiva and lids normal, oropharynx clear. Neck: Supple, no elevated JVP or carotid bruits, no thyromegaly. Lungs: Clear to auscultation, nonlabored breathing at rest. Cardiac: Regular rate and rhythm, no S3 or significant systolic murmur. Abdomen: Soft, nontender, bowel sounds present. Extremities: No pitting edema, distal pulses 2+. Skin: Warm and dry.  Ecchymosis on the forearms. Musculoskeletal: No kyphosis. Neuropsychiatric: Alert and oriented x3, affect grossly appropriate.  ECG: I personally reviewed the tracing from 11/30/2017 which showed sinus rhythm.  Recent Labwork:  February 2019: Cholesterol 141, HDL 39, LDL 79, triglycerides 113  Other Studies Reviewed Today:  Lexiscan Myoview 04/28/2015:  The left ventricular ejection fraction is normal (55-65%).  Nuclear stress EF: 63%.  There was no ST segment deviation noted during stress.  The study is normal.  Normal stress nuclear  study with a small, mild, fixed inferior septal defect consistent with thinning; no ischemia; EF 63 with normal wall motion.  Assessment and Plan:  1.  Mild to moderate nonobstructive CAD by previous cardiac catheterization in 2006.  Plan is to continue medical therapy at this time in the absence of accelerating angina symptoms.  He is on aspirin and statin.  2.  Mixed hyperlipidemia, on TriCor and Zocor.  He is following lipids with Dr. Gerarda Fraction.  3.  Essential hypertension, blood pressure not optimally controlled.  Plan to increase Cozaar to 100 mg daily.  Check BMET in 2 weeks.  4.  The patient was counseled on tobacco cessation today for 5 minutes.  Counseling included reviewing the risks of smoking tobacco products,  how it impacts the patient's current medical diagnoses and different strategies for quitting.  Pharmacotherapy to aid in tobacco cessation was not prescribed today.  Current medicines were reviewed with the patient today.   Orders Placed This Encounter  Procedures  . Basic metabolic panel    Disposition: Follow-up in 6 months.  Signed, Satira Sark, MD, [facility] 06/21/2018 2:33 PM    [facility] [place], [place], [place] [address] Phone: [phone]; Fax: [phone]

## 2018-06-21 ENCOUNTER — Encounter: Payer: Self-pay | Admitting: Cardiology

## 2018-06-21 ENCOUNTER — Ambulatory Visit: Payer: Medicare Other | Admitting: Cardiology

## 2018-06-21 VITALS — BP 145/84 | HR 75 | Ht 66.0 in | Wt 204.6 lb

## 2018-06-21 DIAGNOSIS — Z720 Tobacco use: Secondary | ICD-10-CM | POA: Diagnosis not present

## 2018-06-21 DIAGNOSIS — I10 Essential (primary) hypertension: Secondary | ICD-10-CM | POA: Diagnosis not present

## 2018-06-21 DIAGNOSIS — E782 Mixed hyperlipidemia: Secondary | ICD-10-CM

## 2018-06-21 DIAGNOSIS — I25119 Atherosclerotic heart disease of native coronary artery with unspecified angina pectoris: Secondary | ICD-10-CM

## 2018-06-21 MED ORDER — LOSARTAN POTASSIUM 100 MG PO TABS: 100.0000 mg | ORAL_TABLET | Freq: Every day | ORAL | 2 refills | Status: DC

## 2018-06-21 NOTE — Patient Instructions (Addendum)
Medication Instructions:   Your physician has recommended you make the following change in your medication:   Increase losartan to 100 mg daily. You may take (2) of your 50 mg tablets daily until they are finished.  Continue all other medications the same.  Labwork:  Your physician recommends that you return for lab work in: 2 weeks to check your BMET.  Testing/Procedures:  NONE  Follow-Up:  Your physician recommends that you schedule a follow-up appointment in: 6 months. You will receive a reminder letter in the mail in about 4 months reminding you to call and schedule your appointment. If you don't receive this letter, please contact our office.  Any Other Special Instructions Will Be Listed Below (If Applicable).  If you need a refill on your cardiac medications before your next appointment, please call your pharmacy.

## 2018-06-27 DIAGNOSIS — I10 Essential (primary) hypertension: Secondary | ICD-10-CM | POA: Diagnosis not present

## 2018-06-27 DIAGNOSIS — Z6832 Body mass index (BMI) 32.0-32.9, adult: Secondary | ICD-10-CM | POA: Diagnosis not present

## 2018-06-27 DIAGNOSIS — G894 Chronic pain syndrome: Secondary | ICD-10-CM | POA: Diagnosis not present

## 2018-06-27 DIAGNOSIS — J329 Chronic sinusitis, unspecified: Secondary | ICD-10-CM | POA: Diagnosis not present

## 2018-07-05 ENCOUNTER — Telehealth: Payer: Self-pay | Admitting: *Deleted

## 2018-07-05 ENCOUNTER — Encounter: Payer: Self-pay | Admitting: General Surgery

## 2018-07-05 ENCOUNTER — Other Ambulatory Visit (HOSPITAL_COMMUNITY)
Admission: RE | Admit: 2018-07-05 | Discharge: 2018-07-05 | Disposition: A | Discharge status: Home / Self Care | Payer: Medicare Other | Source: Ambulatory Visit | Attending: Cardiology | Admitting: Cardiology

## 2018-07-05 ENCOUNTER — Ambulatory Visit: Payer: Medicare Other | Admitting: General Surgery

## 2018-07-05 VITALS — BP 143/82 | HR 76 | Temp 98.2°F | Resp 18 | Wt 200.6 lb

## 2018-07-05 DIAGNOSIS — I10 Essential (primary) hypertension: Secondary | ICD-10-CM | POA: Insufficient documentation

## 2018-07-05 DIAGNOSIS — K4090 Unilateral inguinal hernia, without obstruction or gangrene, not specified as recurrent: Secondary | ICD-10-CM | POA: Diagnosis not present

## 2018-07-05 DIAGNOSIS — K4091 Unilateral inguinal hernia, without obstruction or gangrene, recurrent: Secondary | ICD-10-CM | POA: Diagnosis not present

## 2018-07-05 DIAGNOSIS — I25119 Atherosclerotic heart disease of native coronary artery with unspecified angina pectoris: Secondary | ICD-10-CM | POA: Diagnosis not present

## 2018-07-05 LAB — BASIC METABOLIC PANEL
ANION GAP: 8 (ref 5–15)
BUN: 17 mg/dL (ref 8–23)
CALCIUM: 9.9 mg/dL (ref 8.9–10.3)
CO2: 26 mmol/L (ref 22–32)
CREATININE: 1.17 mg/dL (ref 0.61–1.24)
Chloride: 106 mmol/L (ref 98–111)
GLUCOSE: 125 mg/dL — AB (ref 70–99)
Potassium: 4.1 mmol/L (ref 3.5–5.1)
Sodium: 140 mmol/L (ref 135–145)

## 2018-07-05 NOTE — Telephone Encounter (Signed)
-----   Message from Satira Sark, MD sent at 07/05/2018 11:34 AM EST ----- Results reviewed.  Cozaar recently increased.  Renal function is relatively stable and potassium is normal.  Continue with current plan. A copy of this test should be forwarded to Redmond [employer], MD.

## 2018-07-05 NOTE — Telephone Encounter (Signed)
Patient informed. Copy sent to PCP °

## 2018-07-05 NOTE — Patient Instructions (Signed)
Inguinal Hernia, Adult An inguinal hernia is when fat or your intestines push through a weak spot in a muscle where your leg meets your lower belly (groin). This causes a rounded lump (bulge). This kind of hernia could also be:  In your scrotum, if you are male.  In folds of skin around your vagina, if you are male. There are three types of inguinal hernias. These include:  Hernias that can be pushed back into the belly (are reducible). This type rarely causes pain.  Hernias that cannot be pushed back into the belly (are incarcerated).  Hernias that cannot be pushed back into the belly and lose their blood supply (are strangulated). This type needs emergency surgery. If you do not have symptoms, you may not need treatment. If you have symptoms or a large hernia, you may need surgery. Follow these instructions at home: Lifestyle  Do these things if told by your doctor so you do not have trouble pooping (constipation): ? Drink enough fluid to keep your pee (urine) pale yellow. ? Eat foods that have a lot of fiber. These include fresh fruits and vegetables, whole grains, and beans. ? Limit foods that are high in fat and processed sugars. These include foods that are fried or sweet. ? Take medicine for trouble pooping.  Avoid lifting heavy objects.  Avoid standing for long amounts of time.  Do not use any products that contain nicotine or tobacco. These include cigarettes and e-cigarettes. If you need help quitting, ask your doctor.  Stay at a healthy weight. General instructions  You may try to push your hernia in by very gently pressing on it when you are lying down. Do not try to force the bulge back in if it will not push in easily.  Watch your hernia for any changes in shape, size, or color. Tell your doctor if you see any changes.  Take over-the-counter and prescription medicines only as told by your doctor.  Keep all follow-up visits as told by your doctor. This is  important. Contact a doctor if:  You have a fever.  You have new symptoms.  Your symptoms get worse. Get help right away if:  The area where your leg meets your lower belly has: ? Pain that gets worse suddenly. ? A bulge that gets bigger suddenly, and it does not get smaller after that. ? A bulge that turns red or purple. ? A bulge that is painful when you touch it.  You are a man, and your scrotum: ? Suddenly feels painful. ? Suddenly changes in size.  You cannot push the hernia in by very gently pressing on it when you are lying down. Do not try to force the bulge back in if it will not push in easily.  You feel sick to your stomach (nauseous), and that feeling does not go away.  You throw up (vomit), and that keeps happening.  You have a fast heartbeat.  You cannot poop (have a bowel movement) or pass gas. These symptoms may be an emergency. Do not wait to see if the symptoms will go away. Get medical help right away. Call your local emergency services (911 in the [place] Summary  An inguinal hernia is when fat or your intestines push through a weak spot in a muscle where your leg meets your lower belly (groin). This causes a rounded lump (bulge).  If you do not have symptoms, you may not need treatment. If you have symptoms or a large hernia, you   may need surgery.  Avoid lifting heavy objects. Also avoid standing for long amounts of time.  Do not try to force the bulge back in if it will not push in easily. This information is not intended to replace advice given to you by your health care provider. Make sure you discuss any questions you have with your health care provider. Document Released: 06/09/2006 Document Revised: 06/10/2017 Document Reviewed: 02/08/2017 Elsevier Interactive Patient Education  2019 [employer], Adult  Open hernia repair is a surgical procedure to fix a hernia. A hernia occurs when an internal organ or tissue pushes out  through a weak spot in the abdominal wall muscles. Hernias commonly occur in the groin and around the navel. Most hernias tend to get worse over time. Often, surgery is done to prevent the hernia from becoming bigger, uncomfortable, or an emergency. Emergency surgery may be needed if abdominal contents get stuck in the opening (incarcerated hernia) or the blood supply gets cut off (strangulated hernia). In an open repair, an incision is made in the abdomen to perform the surgery. Tell a health care provider about:  Any allergies you have.  All medicines you are taking, including vitamins, herbs, eye drops, creams, and over-the-counter medicines.  Any problems you or family members have had with anesthetic medicines.  Any blood or bone disorders you have.  Any surgeries you have had.  Any medical conditions you have, including any recent cold or flu symptoms.  Whether you are pregnant or may be pregnant. What are the risks? Generally, this is a safe procedure. However, problems may occur, including:  Long-lasting (chronic) pain.  Bleeding.  Infection.  Damage to the testicle. This can cause shrinking or swelling.  Damage to the bladder, blood vessels, intestine, or nerves near the hernia.  Trouble passing urine.  Allergic reactions to medicines.  Return of the hernia. Medicines  Ask your health care provider about: ? Changing or stopping your regular medicines. This is especially important if you are taking diabetes medicines or blood thinners. ? Taking medicines such as aspirin and ibuprofen. These medicines can thin your blood. Do not take these medicines before your procedure if your health care provider instructs you not to.  You may be given antibiotic medicine to help prevent infection. General instructions  You may have blood tests or imaging studies.  Ask your health care provider how your surgical site will be marked or identified.  If you smoke, do not smoke  for at least 2 weeks before your procedure or for as long as told by your health care provider.  Let your health care provider know if you develop a cold or any infection before your surgery.  Plan to have someone take you home from the hospital or clinic.  If you will be going home right after the procedure, plan to have someone with you for 24 hours. What happens during the procedure?  To reduce your risk of infection: ? Your health care team will wash or sanitize their hands. ? Your skin will be washed with soap. ? Hair may be removed from the surgical area.  An IV tube will be inserted into one of your veins.  You will be given one or more of the following: ? A medicine to help you relax (sedative). ? A medicine to numb the area (local anesthetic). ? A medicine to make you fall asleep (general anesthetic).  Your surgeon will make an incision over the hernia.  The tissues of the hernia will be moved back into place.  The edges of the hernia may be stitched together.  The opening in the abdominal muscles will be closed with stitches (sutures). Or, your surgeon will place a mesh patch made of manmade (synthetic) material over the opening.  The incision will be closed.  A bandage (dressing) may be placed over the incision. The procedure may vary among health care providers and hospitals. What happens after the procedure?  Your blood pressure, heart rate, breathing rate, and blood oxygen level will be monitored until the medicines you were given have worn off.  You may be given medicine for pain.  Do not drive for 24 hours if you received a sedative. This information is not intended to replace advice given to you by your health care provider. Make sure you discuss any questions you have with your health care provider. Document Released: 11/02/2000 Document Revised: 11/27/2015 Document Reviewed: 10/21/2015 Elsevier Interactive Patient Education  2019 [employer].

## 2018-07-05 NOTE — Progress Notes (Signed)
[facility] History and Physical Reason for Referral: Recurrent Left inguinal hernia  Referring Physician:  Dr. Gerarda Fraction / Delman Cheadle    [name] is a 68 y.o. male.  HPI: [name] is a 68 yo with a history of COPD, CAD s/p cardiac cath in 2006 with nonobstructing disease and has had medical management, COPD, IBS, HTN, who presents with a recurrent left inguinal hernia. He reports that over the past few months he has noticed a bulge in the left groin and this occurs more often when he coughs. He says that he has discomfort when it enlarges, and he has to hold his groin and push inward to get relief.  He says that he had a prior hernia repair on the left about 25-30 years ago.  He says that it was done in [place] and he is not sure if they used mesh or not. He has never noticed a hernia on the right per his report. The left side is the only side that sticks out and causes him discomfort.    He reports that he has been seen recently by Dr. Domenic Polite with cardiology. He was previously on a beta blocker and was having bradycardia and this has been stopped. Dr. Domenic Polite has been managing and changing some of his BP meds.  He just had labs today to make sure he was doing ok with his medications.  The patient reports that he has some pain in his chest at night in bed about 1-2 times every 2 months or so.  He denies any chest pressure or pain with activity. He denies any nitroglycerin use.   Past Medical History:  Diagnosis Date  . Allergic rhinitis   . Anxiety and depression   . Asthma   . CAD (coronary artery disease)    Mild to moderate nonobstructive LAD disease 2006  . Chronic low back pain   . COPD (chronic obstructive pulmonary disease) ([place])   . Dyslipidemia   . Essential hypertension   . GERD (gastroesophageal reflux disease)   . Hearing loss   . Hemorrhoids   . Hyperplastic colon polyp 12/28/10  . IBS (irritable bowel syndrome)   . Langerhan's cell histiocytosis  ([place])   . Mitral regurgitation   . OSA on CPAP   . Tubular adenoma 12/28/10    Past Surgical History:  Procedure Laterality Date  . CHOLECYSTECTOMY    . COLONOSCOPY  2007   friable anal canal, hyperplastic polyp  . COLONOSCOPY  12/28/2010   Dr. Gala Romney- tubular adenoma, hyperplastic polyp  . COLONOSCOPY N/A 01/16/2015   Rourk: anal canal and internal hemorrhois. next surveillance colonoscopy 12/2019  . EAR PINNA RECONSTRUCTION W/ RIB GRAFT     right  . ESOPHAGOGASTRODUODENOSCOPY     multiple dilations, last EGD 2007 showed small hh, adenomatous appearing gastric mucosa in body but biopsies benign. SB bx negative for Celiac.  Marland Kitchen ESOPHAGOGASTRODUODENOSCOPY  12/28/2010   Dr. Gala Romney- normal esophagus s/p dilationpatchy erythema and erosions.  Marland Kitchen FOOT SURGERY     right  . INGUINAL HERNIA REPAIR     left  . LUNG BIOPSY  2009   right  . MALONEY DILATION  12/28/2010   Procedure: Venia Minks DILATION;  Surgeon: Daneil Dolin, MD;  [facility]: [facility];  Service: Endoscopy;  Laterality: N/A;  . TONSILLECTOMY    . TRANSTHORACIC ECHOCARDIOGRAM  02/2011   EF =>55%, normal chamber size & function; mild mitral and aortic insuff, mild-mod tricuspid insuff; mild pulm htn with  rsvp of 15mmHg    Family History  Problem Relation Age of Onset  . Heart disease Father 60       etoh/breathing problmes  . COPD Father   . Heart attack Maternal Grandfather   . Diabetes Paternal Grandmother   . COPD Paternal Grandmother   . Heart Problems Paternal Grandfather   . GI problems Mother   . Hyperlipidemia Mother   . COPD Mother   . Heart attack Maternal Grandmother        also valvular problems  . Heart attack Brother        stenting x3, also cancer  . Skin cancer Brother   . Hypertension Brother   . Hypertension Sister   . Colon cancer Neg Hx   . Liver disease Neg Hx   . Inflammatory bowel disease Neg Hx     Social History   Tobacco Use  . Smoking status: Current Every Day Smoker    Packs/day: 0.50     Years: 37.00    Pack years: 18.50    Types: Cigarettes    Start date: 05/23/1978  . Smokeless tobacco: Never Used  . Tobacco comment: down to 0.25ppd  Substance Use Topics  . Alcohol use: No    Alcohol/week: 0.0 standard drinks  . Drug use: No    Medications: I have reviewed the patient's current medications. Allergies as of 07/05/2018      Reactions   Bee Venom Swelling      Medication List       Accurate as of July 05, 2018  2:49 PM. Always use your most recent med list.        albuterol 2 MG tablet Commonly known as:  PROVENTIL Reported on 11/06/2015   albuterol 108 (90 Base) MCG/ACT inhaler Commonly known as:  PROVENTIL HFA;VENTOLIN HFA Inhale 2 puffs into the lungs every 6 (six) hours as needed for wheezing.   aspirin 81 MG tablet Take 81 mg by mouth daily.   fenofibrate 145 MG tablet Commonly known as:  TRICOR Take 145 mg by mouth daily.   fish oil-omega-3 fatty acids 1000 MG capsule Take 2 g by mouth daily.   glucosamine-chondroitin 500-400 MG tablet Take 1 tablet by mouth 2 (two) times daily.   HYDROcodone-acetaminophen 10-325 MG tablet Commonly known as:  NORCO Take 1 tablet by mouth every 6 (six) hours as needed for pain. Reported on 11/03/2015   losartan 100 MG tablet Commonly known as:  COZAAR Take 1 tablet (100 mg total) by mouth daily.   multivitamin capsule Take 1 capsule by mouth daily.   nitroGLYCERIN 0.4 MG SL tablet Commonly known as:  NITROSTAT Place 1 tablet (0.4 mg total) under the tongue every 5 (five) minutes as needed for chest pain.   omeprazole 20 MG capsule Commonly known as:  PRILOSEC Take 1 capsule by mouth daily. Reported on 11/06/2015   simvastatin 20 MG tablet Commonly known as:  ZOCOR Take 1 tablet (20 mg total) by mouth daily.   umeclidinium-vilanterol 62.5-25 MCG/INH Aepb Commonly known as:  ANORO ELLIPTA Inhale 1 puff into the lungs daily.        ROS:  A comprehensive review of systems was negative  except for: Respiratory: positive for cough, wheezing and SOB Cardiovascular: positive for chest pain and HTN Gastrointestinal: positive for abdominal pain and reflux symptoms Musculoskeletal: positive for back pain Neurological: positive for headaches and numbness  Blood pressure (!) 143/82, pulse 76, temperature 98.2 F (36.8 C), temperature source Temporal, resp.  rate 18, weight 200 lb 9.6 oz (91 kg). Physical Exam Vitals signs reviewed.  Constitutional:      Appearance: Normal appearance.  HENT:     Head: Normocephalic and atraumatic.     Mouth/Throat:     Mouth: Mucous membranes are moist.  Eyes:     Extraocular Movements: Extraocular movements intact.     Pupils: Pupils are equal, round, and reactive to light.  Neck:     Musculoskeletal: Normal range of motion.  Cardiovascular:     Rate and Rhythm: Normal rate and regular rhythm.  Abdominal:     General: Abdomen is flat. There is no distension.     Palpations: Abdomen is soft.     Tenderness: There is no abdominal tenderness.     Hernia: A hernia is present. Hernia is present in the right inguinal area and left inguinal area.  Musculoskeletal: Normal range of motion.        General: Swelling present.  Skin:    General: Skin is warm and dry.  Neurological:     General: No focal deficit present.     Mental Status: He is alert and oriented to person, place, and time.  Psychiatric:        Mood and Affect: Mood normal.        Behavior: Behavior normal.        Thought Content: Thought content normal.        Judgment: Judgment normal.     Results: Results for orders placed or performed during the hospital encounter of 07/05/18 (from the past 48 hour(s))  Basic metabolic panel     Status: Abnormal   Collection Time: 07/05/18 11:06 AM  Result Value Ref Range   Sodium 140 135 - 145 mmol/L   Potassium 4.1 3.5 - 5.1 mmol/L   Chloride 106 98 - 111 mmol/L   CO2 26 22 - 32 mmol/L   Glucose, Bld 125 (H) 70 - 99 mg/dL   BUN  17 8 - 23 mg/dL   Creatinine, Ser 1.17 0.61 - 1.24 mg/dL   Calcium 9.9 8.9 - 10.3 mg/dL   GFR calc non Af Amer >60 >60 mL/min   GFR calc Af Amer >60 >60 mL/min   Anion gap 8 5 - 15    Comment: Performed at [facility], [address] [place], [place] [address]    CT a/p from 2012 reviewed- Inguinal hernias bilaterally noted containing fat; this would have been after his prior surgery 25-30 years ago   Assessment & Plan:  [name] is a 68 y.o. male with bilateral inguinal hernias with a left recurrent hernia that is causing him some discomfort when he is coughing. He has to push the hernia back into place.   Discussed the risk and benefits including, bleeding, infection, use of mesh, risk of recurrence, risk of nerve damage causing numbness or changes in sensation, risk of damage to the cord structures. The patient understands the risk and benefits of repair with mesh, and has decided to proceed.  We also discussed open versus laparoscopic surgery and the use of mesh. We discussed that I do open repairs with mesh, and that this is considered equivalent to laparoscopic surgery. We discussed reasons for opting for laparoscopic surgery including if a bilateral repair is needed or if a patient has a recurrence after an open repair with mesh.  [name] had an open repair but I do not think mesh was used on reviewing his CT from 2012.  He also is not symptomatic with the right inguinal hernia. We have discussed and he wants to proceed with left inguinal hernia repair with mesh and will get the right repaired at a later date if this becomes symptomatic.   He has a history of CAD and catheterization in 2006 that demonstrated non-obstructing disease. He has been managed medically. Dr. Domenic Polite saw him 06/21/2018. I contacted Dr. Domenic Polite regarding the patient. He said that he has been titrating the Cozaar for BP control and that he felt that the patient had stable cardiac symptoms. He felt that if  the BP was improved that he would need no further workup.  BP today was 916O systolic. His labs showed stable renal function and a normal K with the Cozaar increase.    All questions were answered to the satisfaction of the patient.   Virl Cagey 07/05/2018, 2:49 PM

## 2018-07-07 NOTE — H&P (Signed)
[facility] History and Physical Reason for Referral: Recurrent Left inguinal hernia  Referring Physician:  Dr. Gerarda Fraction / Delman Cheadle   [name] is a 68 y.o. male.  HPI: [name] is a 68 yo with a history of COPD, CAD s/p cardiac cath in 2006 with nonobstructing disease and has had medical management, COPD, IBS, HTN, who presents with a recurrent left inguinal hernia. He reports that over the past few months he has noticed a bulge in the left groin and this occurs more often when he coughs. He says that he has discomfort when it enlarges, and he has to hold his groin and push inward to get relief.  He says that he had a prior hernia repair on the left about 25-30 years ago.  He says that it was done in [place] and he is not sure if they used mesh or not. He has never noticed a hernia on the right per his report. The left side is the only side that sticks out and causes him discomfort.    He reports that he has been seen recently by Dr. Domenic Polite with cardiology. He was previously on a beta blocker and was having bradycardia and this has been stopped. Dr. Domenic Polite has been managing and changing some of his BP meds.  He just had labs today to make sure he was doing ok with his medications.  The patient reports that he has some pain in his chest at night in bed about 1-2 times every 2 months or so.  He denies any chest pressure or pain with activity. He denies any nitroglycerin use.       Past Medical History:  Diagnosis Date  . Allergic rhinitis   . Anxiety and depression   . Asthma   . CAD (coronary artery disease)    Mild to moderate nonobstructive LAD disease 2006  . Chronic low back pain   . COPD (chronic obstructive pulmonary disease) ([place])   . Dyslipidemia   . Essential hypertension   . GERD (gastroesophageal reflux disease)   . Hearing loss   . Hemorrhoids   . Hyperplastic colon polyp 12/28/10  . IBS (irritable bowel syndrome)   . Langerhan's  cell histiocytosis ([place])   . Mitral regurgitation   . OSA on CPAP   . Tubular adenoma 12/28/10         Past Surgical History:  Procedure Laterality Date  . CHOLECYSTECTOMY    . COLONOSCOPY  2007   friable anal canal, hyperplastic polyp  . COLONOSCOPY  12/28/2010   Dr. Gala Romney- tubular adenoma, hyperplastic polyp  . COLONOSCOPY N/A 01/16/2015   Rourk: anal canal and internal hemorrhois. next surveillance colonoscopy 12/2019  . EAR PINNA RECONSTRUCTION W/ RIB GRAFT     right  . ESOPHAGOGASTRODUODENOSCOPY     multiple dilations, last EGD 2007 showed small hh, adenomatous appearing gastric mucosa in body but biopsies benign. SB bx negative for Celiac.  Marland Kitchen ESOPHAGOGASTRODUODENOSCOPY  12/28/2010   Dr. Gala Romney- normal esophagus s/p dilationpatchy erythema and erosions.  Marland Kitchen FOOT SURGERY     right  . INGUINAL HERNIA REPAIR     left  . LUNG BIOPSY  2009   right  . MALONEY DILATION  12/28/2010   Procedure: Venia Minks DILATION;  Surgeon: Daneil Dolin, MD;  [facility];  Service: Endoscopy;  Laterality: N/A;  . TONSILLECTOMY    . TRANSTHORACIC ECHOCARDIOGRAM  02/2011   EF =>55%, normal chamber size & function; mild mitral and aortic  insuff, mild-mod tricuspid insuff; mild pulm htn with rsvp of 22mmHg         Family History  Problem Relation Age of Onset  . Heart disease Father 84       etoh/breathing problmes  . COPD Father   . Heart attack Maternal Grandfather   . Diabetes Paternal Grandmother   . COPD Paternal Grandmother   . Heart Problems Paternal Grandfather   . GI problems Mother   . Hyperlipidemia Mother   . COPD Mother   . Heart attack Maternal Grandmother        also valvular problems  . Heart attack Brother        stenting x3, also cancer  . Skin cancer Brother   . Hypertension Brother   . Hypertension Sister   . Colon cancer Neg Hx   . Liver disease Neg Hx   . Inflammatory bowel disease Neg Hx     Social  History        Tobacco Use  . Smoking status: Current Every Day Smoker    Packs/day: 0.50    Years: 37.00    Pack years: 18.50    Types: Cigarettes    Start date: 05/23/1978  . Smokeless tobacco: Never Used  . Tobacco comment: down to 0.25ppd  Substance Use Topics  . Alcohol use: No    Alcohol/week: 0.0 standard drinks  . Drug use: No    Medications: I have reviewed the patient's current medications. Allergies as of 07/05/2018      Reactions   Bee Venom Swelling         Medication List       Accurate as of July 05, 2018  2:49 PM. Always use your most recent med list.        albuterol 2 MG tablet Commonly known as:  PROVENTIL Reported on 11/06/2015   albuterol 108 (90 Base) MCG/ACT inhaler Commonly known as:  PROVENTIL HFA;VENTOLIN HFA Inhale 2 puffs into the lungs every 6 (six) hours as needed for wheezing.   aspirin 81 MG tablet Take 81 mg by mouth daily.   fenofibrate 145 MG tablet Commonly known as:  TRICOR Take 145 mg by mouth daily.   fish oil-omega-3 fatty acids 1000 MG capsule Take 2 g by mouth daily.   glucosamine-chondroitin 500-400 MG tablet Take 1 tablet by mouth 2 (two) times daily.   HYDROcodone-acetaminophen 10-325 MG tablet Commonly known as:  NORCO Take 1 tablet by mouth every 6 (six) hours as needed for pain. Reported on 11/03/2015   losartan 100 MG tablet Commonly known as:  COZAAR Take 1 tablet (100 mg total) by mouth daily.   multivitamin capsule Take 1 capsule by mouth daily.   nitroGLYCERIN 0.4 MG SL tablet Commonly known as:  NITROSTAT Place 1 tablet (0.4 mg total) under the tongue every 5 (five) minutes as needed for chest pain.   omeprazole 20 MG capsule Commonly known as:  PRILOSEC Take 1 capsule by mouth daily. Reported on 11/06/2015   simvastatin 20 MG tablet Commonly known as:  ZOCOR Take 1 tablet (20 mg total) by mouth daily.   umeclidinium-vilanterol 62.5-25 MCG/INH  Aepb Commonly known as:  ANORO ELLIPTA Inhale 1 puff into the lungs daily.        ROS:  A comprehensive review of systems was negative except for: Respiratory: positive for cough, wheezing and SOB Cardiovascular: positive for chest pain and HTN Gastrointestinal: positive for abdominal pain and reflux symptoms Musculoskeletal: positive for back pain Neurological:  positive for headaches and numbness  Blood pressure (!) 143/82, pulse 76, temperature 98.2 F (36.8 C), temperature source Temporal, resp. rate 18, weight 200 lb 9.6 oz (91 kg). Physical Exam Vitals signs reviewed.  Constitutional:      Appearance: Normal appearance.  HENT:     Head: Normocephalic and atraumatic.     Mouth/Throat:     Mouth: Mucous membranes are moist.  Eyes:     Extraocular Movements: Extraocular movements intact.     Pupils: Pupils are equal, round, and reactive to light.  Neck:     Musculoskeletal: Normal range of motion.  Cardiovascular:     Rate and Rhythm: Normal rate and regular rhythm.  Abdominal:     General: Abdomen is flat. There is no distension.     Palpations: Abdomen is soft.     Tenderness: There is no abdominal tenderness.     Hernia: A hernia is present. Hernia is present in the right inguinal area and left inguinal area.  Musculoskeletal: Normal range of motion.        General: Swelling present.  Skin:    General: Skin is warm and dry.  Neurological:     General: No focal deficit present.     Mental Status: He is alert and oriented to person, place, and time.  Psychiatric:        Mood and Affect: Mood normal.        Behavior: Behavior normal.        Thought Content: Thought content normal.        Judgment: Judgment normal.     Results: LabResultsLast48Hours        Results for orders placed or performed during the hospital encounter of 07/05/18 (from the past 48 hour(s))  Basic metabolic panel     Status: Abnormal   Collection Time: 07/05/18 11:06 AM   Result Value Ref Range   Sodium 140 135 - 145 mmol/L   Potassium 4.1 3.5 - 5.1 mmol/L   Chloride 106 98 - 111 mmol/L   CO2 26 22 - 32 mmol/L   Glucose, Bld 125 (H) 70 - 99 mg/dL   BUN 17 8 - 23 mg/dL   Creatinine, Ser 1.17 0.61 - 1.24 mg/dL   Calcium 9.9 8.9 - 10.3 mg/dL   GFR calc non Af Amer >60 >60 mL/min   GFR calc Af Amer >60 >60 mL/min   Anion gap 8 5 - 15    Comment: Performed at [facility], [address], [place], [place] [address]      CT a/p from 2012 reviewed- Inguinal hernias bilaterally noted containing fat; this would have been after his prior surgery 25-30 years ago   Assessment & Plan:  [name] is a 68 y.o. male with bilateral inguinal hernias with a left recurrent hernia that is causing him some discomfort when he is coughing. He has to push the hernia back into place.   Discussed the risk and benefits including, bleeding, infection, use of mesh, risk of recurrence, risk of nerve damage causing numbness or changes in sensation, risk of damage to the cord structures. The patient understands the risk and benefits of repair with mesh, and has decided to proceed.  We also discussed open versus laparoscopic surgery and the use of mesh. We discussed that I do open repairs with mesh, and that this is considered equivalent to laparoscopic surgery. We discussed reasons for opting for laparoscopic surgery including if a bilateral repair is needed or if a  patient has a recurrence after an open repair with mesh.  Mr. Mcbane had an open repair but I do not think mesh was used on reviewing his CT from 2012. He also is not symptomatic with the right inguinal hernia. We have discussed and he wants to proceed with left inguinal hernia repair with mesh and will get the right repaired at a later date if this becomes symptomatic.   He has a history of CAD and catheterization in 2006 that demonstrated non-obstructing disease. He has been managed medically. Dr.  Domenic Polite saw him 06/21/2018. I contacted Dr. Domenic Polite regarding the patient. He said that he has been titrating the Cozaar for BP control and that he felt that the patient had stable cardiac symptoms. He felt that if the BP was improved that he would need no further workup.  BP today was 423N systolic. His labs showed stable renal function and a normal K with the Cozaar increase.    All questions were answered to the satisfaction of the patient.   Virl Cagey 07/05/2018, 2:49 PM

## 2018-07-10 ENCOUNTER — Telehealth: Payer: Self-pay | Admitting: *Deleted

## 2018-07-10 NOTE — Telephone Encounter (Signed)
-----   Message from Virl Cagey, MD sent at 07/06/2018 12:58 PM EST ----- Regarding: RE: Inguinal hernia repair Sounds Good.  Thank You, Ria Comment ----- Message ----- From: Satira Sark, MD Sent: 07/06/2018   8:01 AM EST To: Merlene Laughter, RN, Virl Cagey, MD Subject: RE: Inguinal hernia repair                     Thanks for the question.  I reviewed my recent note.  He was relatively stable at that time from a cardiac perspective.  We increased his Cozaar for better blood pressure control and he had follow-up lab work.  As long as his blood pressure is reasonable, I doubt that he will need any further medication adjustments or cardiac testing prior to the operation. ----- Message ----- From: Virl Cagey, MD Sent: 07/05/2018   5:57 PM EST To: Satira Sark, MD, Luanne Bras, CMA Subject: Inguinal hernia repair                         Dr. Domenic Polite,  I feel like we have a had a run of these patients in the past few weeks. They see you and two weeks later see me for surgery.    Mr. Lipe describes about the same type of stable chest pain only at night in bed occurring about 1-2 times every 2 months or so to me and denies nitroglycerin use.  He is scheduled for redo Open inguinal hernia surgery w/ mesh 2/28.    I know that he was taken off a beta blocker and you were adjusting BP meds. Just wanted to reach out to see if you thought he needed anything else prior to OR.  Thanks, Rosalene Billings

## 2018-07-17 ENCOUNTER — Encounter (HOSPITAL_COMMUNITY)
Admission: RE | Admit: 2018-07-17 | Discharge: 2018-07-17 | Disposition: A | Discharge status: Home / Self Care | Payer: Medicare Other | Source: Ambulatory Visit | Attending: General Surgery | Admitting: General Surgery

## 2018-07-20 ENCOUNTER — Ambulatory Visit (HOSPITAL_COMMUNITY)
Admission: RE | Admit: 2018-07-20 | Discharge: 2018-07-20 | Disposition: A | Discharge status: Home / Self Care | Payer: Medicare Other | Attending: General Surgery | Admitting: General Surgery

## 2018-07-20 ENCOUNTER — Encounter (HOSPITAL_COMMUNITY): Payer: Self-pay | Admitting: *Deleted

## 2018-07-20 ENCOUNTER — Ambulatory Visit (HOSPITAL_COMMUNITY): Payer: Medicare Other | Admitting: Anesthesiology

## 2018-07-20 ENCOUNTER — Encounter (HOSPITAL_COMMUNITY)
Admission: RE | Disposition: A | Discharge status: Home / Self Care | Payer: Self-pay | Source: Home / Self Care | Attending: General Surgery

## 2018-07-20 ENCOUNTER — Other Ambulatory Visit: Payer: Self-pay

## 2018-07-20 DIAGNOSIS — G4730 Sleep apnea, unspecified: Secondary | ICD-10-CM | POA: Diagnosis not present

## 2018-07-20 DIAGNOSIS — I272 Pulmonary hypertension, unspecified: Secondary | ICD-10-CM | POA: Diagnosis not present

## 2018-07-20 DIAGNOSIS — F329 Major depressive disorder, single episode, unspecified: Secondary | ICD-10-CM | POA: Diagnosis not present

## 2018-07-20 DIAGNOSIS — C966 Unifocal Langerhans-cell histiocytosis: Secondary | ICD-10-CM | POA: Diagnosis not present

## 2018-07-20 DIAGNOSIS — F419 Anxiety disorder, unspecified: Secondary | ICD-10-CM | POA: Insufficient documentation

## 2018-07-20 DIAGNOSIS — K589 Irritable bowel syndrome without diarrhea: Secondary | ICD-10-CM | POA: Insufficient documentation

## 2018-07-20 DIAGNOSIS — J449 Chronic obstructive pulmonary disease, unspecified: Secondary | ICD-10-CM | POA: Insufficient documentation

## 2018-07-20 DIAGNOSIS — I340 Nonrheumatic mitral (valve) insufficiency: Secondary | ICD-10-CM | POA: Insufficient documentation

## 2018-07-20 DIAGNOSIS — Z7951 Long term (current) use of inhaled steroids: Secondary | ICD-10-CM | POA: Insufficient documentation

## 2018-07-20 DIAGNOSIS — I10 Essential (primary) hypertension: Secondary | ICD-10-CM | POA: Diagnosis not present

## 2018-07-20 DIAGNOSIS — G8929 Other chronic pain: Secondary | ICD-10-CM | POA: Insufficient documentation

## 2018-07-20 DIAGNOSIS — G4733 Obstructive sleep apnea (adult) (pediatric): Secondary | ICD-10-CM | POA: Insufficient documentation

## 2018-07-20 DIAGNOSIS — Z825 Family history of asthma and other chronic lower respiratory diseases: Secondary | ICD-10-CM | POA: Insufficient documentation

## 2018-07-20 DIAGNOSIS — H9190 Unspecified hearing loss, unspecified ear: Secondary | ICD-10-CM | POA: Diagnosis not present

## 2018-07-20 DIAGNOSIS — Z7982 Long term (current) use of aspirin: Secondary | ICD-10-CM | POA: Insufficient documentation

## 2018-07-20 DIAGNOSIS — K4090 Unilateral inguinal hernia, without obstruction or gangrene, not specified as recurrent: Secondary | ICD-10-CM | POA: Diagnosis not present

## 2018-07-20 DIAGNOSIS — F17210 Nicotine dependence, cigarettes, uncomplicated: Secondary | ICD-10-CM | POA: Diagnosis not present

## 2018-07-20 DIAGNOSIS — Z8249 Family history of ischemic heart disease and other diseases of the circulatory system: Secondary | ICD-10-CM | POA: Insufficient documentation

## 2018-07-20 DIAGNOSIS — J45909 Unspecified asthma, uncomplicated: Secondary | ICD-10-CM | POA: Diagnosis not present

## 2018-07-20 DIAGNOSIS — Z86010 Personal history of colonic polyps: Secondary | ICD-10-CM | POA: Insufficient documentation

## 2018-07-20 DIAGNOSIS — K219 Gastro-esophageal reflux disease without esophagitis: Secondary | ICD-10-CM | POA: Insufficient documentation

## 2018-07-20 DIAGNOSIS — Z808 Family history of malignant neoplasm of other organs or systems: Secondary | ICD-10-CM | POA: Insufficient documentation

## 2018-07-20 DIAGNOSIS — I2510 Atherosclerotic heart disease of native coronary artery without angina pectoris: Secondary | ICD-10-CM | POA: Diagnosis not present

## 2018-07-20 DIAGNOSIS — Z79899 Other long term (current) drug therapy: Secondary | ICD-10-CM | POA: Diagnosis not present

## 2018-07-20 DIAGNOSIS — K4091 Unilateral inguinal hernia, without obstruction or gangrene, recurrent: Secondary | ICD-10-CM | POA: Diagnosis not present

## 2018-07-20 DIAGNOSIS — Z91030 Bee allergy status: Secondary | ICD-10-CM | POA: Insufficient documentation

## 2018-07-20 DIAGNOSIS — M545 Low back pain: Secondary | ICD-10-CM | POA: Diagnosis not present

## 2018-07-20 DIAGNOSIS — E785 Hyperlipidemia, unspecified: Secondary | ICD-10-CM | POA: Diagnosis not present

## 2018-07-20 DIAGNOSIS — K4190 Unilateral femoral hernia, without obstruction or gangrene, not specified as recurrent: Secondary | ICD-10-CM | POA: Insufficient documentation

## 2018-07-20 DIAGNOSIS — Z9049 Acquired absence of other specified parts of digestive tract: Secondary | ICD-10-CM | POA: Insufficient documentation

## 2018-07-20 DIAGNOSIS — Z833 Family history of diabetes mellitus: Secondary | ICD-10-CM | POA: Insufficient documentation

## 2018-07-20 HISTORY — PX: INGUINAL HERNIA REPAIR: SHX194

## 2018-07-20 SURGERY — REPAIR, HERNIA, INGUINAL, ADULT
Anesthesia: General | Laterality: Left

## 2018-07-20 MED ORDER — FENTANYL CITRATE (PF) 100 MCG/2ML IJ SOLN
INTRAMUSCULAR | Status: DC | PRN
  Administered 2018-07-20 (×3): 25 ug via INTRAVENOUS
  Administered 2018-07-20: 50 ug via INTRAVENOUS
  Administered 2018-07-20: 25 ug via INTRAVENOUS
  Administered 2018-07-20: 75 ug via INTRAVENOUS
  Administered 2018-07-20: 25 ug via INTRAVENOUS

## 2018-07-20 MED ORDER — ONDANSETRON HCL 4 MG/2ML IJ SOLN: 4.0000 mg | Freq: Once | INTRAMUSCULAR | Status: DC | PRN

## 2018-07-20 MED ORDER — HYDROCODONE-ACETAMINOPHEN 7.5-325 MG PO TABS
1.0000 | ORAL_TABLET | Freq: Once | ORAL | Status: AC | PRN
  Administered 2018-07-20: 1 via ORAL
  Filled 2018-07-20: qty 1

## 2018-07-20 MED ORDER — BUPIVACAINE LIPOSOME 1.3 % IJ SUSP
INTRAMUSCULAR | Status: DC | PRN
  Administered 2018-07-20: 20 mL

## 2018-07-20 MED ORDER — BUPIVACAINE LIPOSOME 1.3 % IJ SUSP
INTRAMUSCULAR | Status: AC
  Filled 2018-07-20: qty 20

## 2018-07-20 MED ORDER — GLYCOPYRROLATE 0.2 MG/ML IJ SOLN
INTRAMUSCULAR | Status: DC | PRN
  Administered 2018-07-20: 0.2 mg via INTRAVENOUS

## 2018-07-20 MED ORDER — HYDROMORPHONE HCL 1 MG/ML IJ SOLN
0.2500 mg | INTRAMUSCULAR | Status: DC | PRN
  Administered 2018-07-20 (×2): 0.25 mg via INTRAVENOUS
  Filled 2018-07-20: qty 0.5

## 2018-07-20 MED ORDER — PROPOFOL 10 MG/ML IV BOLUS
INTRAVENOUS | Status: AC
  Filled 2018-07-20: qty 40

## 2018-07-20 MED ORDER — MEPERIDINE HCL 50 MG/ML IJ SOLN: 6.2500 mg | INTRAMUSCULAR | Status: DC | PRN

## 2018-07-20 MED ORDER — CHLORHEXIDINE GLUCONATE CLOTH 2 % EX PADS: 6.0000 | MEDICATED_PAD | Freq: Once | CUTANEOUS | Status: DC

## 2018-07-20 MED ORDER — MIDAZOLAM HCL 5 MG/5ML IJ SOLN
INTRAMUSCULAR | Status: DC | PRN
  Administered 2018-07-20: 2 mg via INTRAVENOUS

## 2018-07-20 MED ORDER — FENTANYL CITRATE (PF) 250 MCG/5ML IJ SOLN
INTRAMUSCULAR | Status: AC
  Filled 2018-07-20: qty 5

## 2018-07-20 MED ORDER — MIDAZOLAM HCL 2 MG/2ML IJ SOLN
INTRAMUSCULAR | Status: AC
  Filled 2018-07-20: qty 2

## 2018-07-20 MED ORDER — KETOROLAC TROMETHAMINE 30 MG/ML IJ SOLN
30.0000 mg | Freq: Once | INTRAMUSCULAR | Status: AC | PRN
  Administered 2018-07-20: 30 mg via INTRAVENOUS
  Filled 2018-07-20: qty 1

## 2018-07-20 MED ORDER — CEFAZOLIN SODIUM-DEXTROSE 2-4 GM/100ML-% IV SOLN
INTRAVENOUS | Status: AC
  Filled 2018-07-20: qty 100

## 2018-07-20 MED ORDER — LACTATED RINGERS IV SOLN
INTRAVENOUS | Status: DC
  Administered 2018-07-20: 08:00:00 via INTRAVENOUS

## 2018-07-20 MED ORDER — SODIUM CHLORIDE 0.9 % IR SOLN
Status: DC | PRN
  Administered 2018-07-20: 1000 mL

## 2018-07-20 MED ORDER — PROPOFOL 10 MG/ML IV BOLUS
INTRAVENOUS | Status: DC | PRN
  Administered 2018-07-20: 170 mg via INTRAVENOUS

## 2018-07-20 MED ORDER — CEFAZOLIN SODIUM-DEXTROSE 2-4 GM/100ML-% IV SOLN
2.0000 g | INTRAVENOUS | Status: AC
  Administered 2018-07-20: 2 g via INTRAVENOUS

## 2018-07-20 SURGICAL SUPPLY — 40 items
BLADE HEX COATED 2.75 (ELECTRODE) ×2 IMPLANT
BRUSH SCRUB SURG 4.25 DISP (MISCELLANEOUS) ×2 IMPLANT
CLOTH BEACON ORANGE TIMEOUT ST (SAFETY) ×2 IMPLANT
COVER LIGHT HANDLE STERIS (MISCELLANEOUS) ×4 IMPLANT
COVER WAND RF STERILE (DRAPES) ×2 IMPLANT
DERMABOND ADVANCED (GAUZE/BANDAGES/DRESSINGS) ×1
DERMABOND ADVANCED .7 DNX12 (GAUZE/BANDAGES/DRESSINGS) ×1 IMPLANT
DRAIN PENROSE 18X1/2 LTX STRL (DRAIN) ×2 IMPLANT
ELECT REM PT RETURN 9FT ADLT (ELECTROSURGICAL) ×2
ELECTRODE REM PT RTRN 9FT ADLT (ELECTROSURGICAL) ×1 IMPLANT
GAUZE 4X4 16PLY RFD (DISPOSABLE) ×2 IMPLANT
GLOVE BIO SURGEON STRL SZ 6.5 (GLOVE) ×2 IMPLANT
GLOVE BIO SURGEON STRL SZ7 (GLOVE) ×2 IMPLANT
GLOVE BIOGEL PI IND STRL 6.5 (GLOVE) ×1 IMPLANT
GLOVE BIOGEL PI IND STRL 7.0 (GLOVE) ×2 IMPLANT
GLOVE BIOGEL PI INDICATOR 6.5 (GLOVE) ×1
GLOVE BIOGEL PI INDICATOR 7.0 (GLOVE) ×2
GLOVE ECLIPSE 6.5 STRL STRAW (GLOVE) ×4 IMPLANT
GLOVE SURG SS PI 7.5 STRL IVOR (GLOVE) ×2 IMPLANT
GOWN STRL REUS W/ TWL XL LVL3 (GOWN DISPOSABLE) ×1 IMPLANT
GOWN STRL REUS W/TWL LRG LVL3 (GOWN DISPOSABLE) ×6 IMPLANT
GOWN STRL REUS W/TWL XL LVL3 (GOWN DISPOSABLE) ×1
INST SET MINOR GENERAL (KITS) ×2 IMPLANT
KIT TURNOVER KIT A (KITS) ×2 IMPLANT
MANIFOLD NEPTUNE II (INSTRUMENTS) ×2 IMPLANT
MESH MARLEX PLUG MEDIUM (Mesh General) ×4 IMPLANT
NEEDLE HYPO 21X1.5 SAFETY (NEEDLE) ×2 IMPLANT
NS IRRIG 1000ML POUR BTL (IV SOLUTION) ×2 IMPLANT
PACK MINOR (CUSTOM PROCEDURE TRAY) ×2 IMPLANT
PAD ARMBOARD 7.5X6 YLW CONV (MISCELLANEOUS) ×2 IMPLANT
SET BASIN LINEN APH (SET/KITS/TRAYS/PACK) ×2 IMPLANT
SUT MNCRL AB 4-0 PS2 18 (SUTURE) ×2 IMPLANT
SUT NOVA NAB GS-22 2 2-0 T-19 (SUTURE) ×4 IMPLANT
SUT SILK 2 0 SH (SUTURE) ×2 IMPLANT
SUT VIC AB 2-0 CT1 27 (SUTURE) ×1
SUT VIC AB 2-0 CT1 TAPERPNT 27 (SUTURE) ×1 IMPLANT
SUT VIC AB 3-0 SH 27 (SUTURE) ×1
SUT VIC AB 3-0 SH 27X BRD (SUTURE) ×1 IMPLANT
SYR 20CC LL (SYRINGE) ×2 IMPLANT
SYR 30ML LL (SYRINGE) ×2 IMPLANT

## 2018-07-20 NOTE — Interval H&P Note (Signed)
History and Physical Interval Note:  07/20/2018 7:56 AM  [name]  has presented today for surgery, with the diagnosis of recurrent left inguinal hernia  The various methods of treatment have been discussed with the patient and family. After consideration of risks, benefits and other options for treatment, the patient has consented to  Procedure(s): LEFT INGUINAL HERNIA REPAIR WITH MESH (Left) as a surgical intervention .  The patient's history has been reviewed, patient examined, no change in status, stable for surgery.  I have reviewed the patient's chart and labs.  Questions were answered to the patient's satisfaction.    Patient marked. No questions.   Virl Cagey

## 2018-07-20 NOTE — Transfer of Care (Signed)
Immediate Anesthesia Transfer of Care Note  Patient: [name]  Procedure(s) Performed: LEFT INDIRECT INGUINAL AND FEMORAL HERNIA REPAIR WITH MESH (Left )  Patient Location: PACU  Anesthesia Type:General  Level of Consciousness: awake and alert   Airway & Oxygen Therapy: Patient Spontanous Breathing  Post-op Assessment: Report given to RN  Post vital signs: Reviewed  Last Vitals:  Vitals Value Taken Time  BP 135/102 07/20/2018 10:30 AM  Temp 36.7 C 07/20/2018 10:27 AM  Pulse 73 07/20/2018 10:32 AM  Resp 15 07/20/2018 10:32 AM  SpO2 96 % 07/20/2018 10:32 AM  Vitals shown include unvalidated device data.  Last Pain:  Vitals:   07/20/18 0741  TempSrc: Oral  PainSc: 3          Complications: No apparent anesthesia complications

## 2018-07-20 NOTE — Anesthesia Postprocedure Evaluation (Signed)
Anesthesia Post Note  Patient: [name]  Procedure(s) Performed: LEFT INDIRECT INGUINAL AND FEMORAL HERNIA REPAIR WITH MESH (Left )  Patient location during evaluation: PACU Anesthesia Type: General Level of consciousness: awake and alert and oriented Pain management: pain level controlled Vital Signs Assessment: post-procedure vital signs reviewed and stable Respiratory status: spontaneous breathing Cardiovascular status: blood pressure returned to baseline and stable Postop Assessment: no apparent nausea or vomiting Anesthetic complications: no Comments: Late entry     Last Vitals:  Vitals:   07/20/18 1045 07/20/18 1114  BP: 126/86 (!) 145/90  Pulse: 79 68  Resp: 15   Temp:  36.7 C  SpO2: 94% 95%    Last Pain:  Vitals:   07/20/18 1114  TempSrc: Oral  PainSc: 3                  Pinkie Manger

## 2018-07-20 NOTE — Discharge Instructions (Signed)
Discharge Instructions:  Common Complaints: Pain at the incision site is common. This will improve with time. Take your pain medications as described below. Some nausea is common and poor appetite. The main goal is to stay hydrated the first few days after surgery.   Diet/ Activity: Diet as tolerated. You have started and tolerated a diet in the hospital, and should continue to increase what you are able to eat.  Shower per your regular routine daily.  Do not take hot showers.  Take warm showers that are less than 10 minutes. Rest and listen to your body, but do not remain in bed all day.  Walk everyday for at least 15-20 minutes.  Deep cough and move around every 1-2 hours in the first few days after surgery.  Do not pick at the dermabond glue on your incision sites.   This glue film will remain in place for 1-2 weeks and will start to peel off.  Do not place lotions or balms on your incision unless instructed to specifically by Dr. Constance Haw.  Do not lift > 10 lbs, perform excessive bending, pushing, pulling, squatting for 6-8 weeks after surgery.  The activity restrictions and the abdominal binder are to prevent hernia formation at your incision while you are healing.   Medication: Take tylenol and ibuprofen as needed for pain control, alternating every 4-6 hours. Do not exceed 4000mg  of tylenol a day (your Norco has tylenol in it).  Take your Norco for breakthrough pain. Take over the counter Colace for constipation related to narcotic pain medication. You can take Colace 100mg  twice a day if needed for constipation.  If you do not have a bowel movement in 2 days, take Miralax over the counter.  Drink plenty of water to also prevent constipation.   Contact Information: If you have questions or concerns, please call our office, [phone], Monday- Thursday 8AM-5PM and Friday 8AM-12Noon.  If it is after hours or on the weekend, please call [facility], [phone], and ask to  speak to the surgeon on call for Dr. Constance Haw at [facility].    Inguinal Hernia Repair, Adult, Care After This sheet gives you information about how to care for yourself after your procedure. Your health care provider may also give you more specific instructions. If you have problems or questions, contact your health care provider. What can I expect after the procedure? After the procedure, it is common to have:  Pain.  Swelling and bruising around the incision area.  Scrotal swelling, in men.  Some fluid or blood draining from your incisions. Follow these instructions at home: Incision care  Follow instructions from your health care provider about how to take care of your incisions. Make sure you: ? Wash your hands with soap and water before you change your bandage (dressing). If soap and water are not available, use hand sanitizer. ? Change your dressing as told by your health care provider. ? Leave stitches (sutures), skin glue, or adhesive strips in place. These skin closures may need to stay in place for 2 weeks or longer. If adhesive strip edges start to loosen and curl up, you may trim the loose edges. Do not remove adhesive strips completely unless your health care provider tells you to do that.  Check your incision area every day for signs of infection. Check for: ? More redness, swelling, or pain. ? More fluid or blood. ? Warmth. ? Pus or a bad smell.  Wear loose, soft clothing while your incisions  heal. Driving  Do not drive or use heavy machinery while taking prescription pain medicine.  Do not drive for 24 hours if you were given a medicine to help you relax (sedative) during your procedure. Activity  Do not lift anything that is heavier than 10 lb (4.5 kg), or the limit that you are told, until your health care provider says that it is safe.  Ask your health care provider what activities are safe for you.A lot of activity during the first week after surgery can  increase pain and swelling. For 1 week after your procedure: ? Avoid activities that take a lot of effort, such as exercise or sports. ? You may walk and climb stairs as needed for daily activity, but avoid long walks or climbing stairs for exercise. Managing pain and swelling   Put ice on painful or swollen areas: ? Put ice in a plastic bag. ? Place a towel between your skin and the bag. ? Leave the ice on for 20 minutes, 2-3 times a day. General instructions  Do not take baths, swim, or use a hot tub until your health care provider approves. Ask your health care provider if you may take showers. You may only be allowed to take sponge baths.  Take over-the-counter and prescription medicines only as told by your health care provider.  To prevent or treat constipation while you are taking prescription pain medicine, your health care provider may recommend that you: ? Drink enough fluid to keep your urine pale yellow. ? Take over-the-counter or prescription medicines. ? Eat foods that are high in fiber, such as fresh fruits and vegetables, whole grains, and beans. ? Limit foods that are high in fat and processed sugars, such as fried and sweet foods.  Do not use any products that contain nicotine or tobacco, such as cigarettes and e-cigarettes. If you need help quitting, ask your health care provider.  Drink enough fluid to keep your urine pale yellow.  Keep all follow-up visits as told by your health care provider. This is important. Contact a health care provider if:  You have more redness, swelling, or pain around your incisions or your groin area.  You have more swelling in your scrotum.  You have more fluid or blood coming from your incisions.  Your incisions feel warm to the touch.  You have severe pain and medicines do not help.  You have abdominal pain or swelling.  You cannot eat or drink without vomiting.  You cannot urinate or pass a bowel movement.  You  faint.  You feel dizzy.  You have nausea and vomiting.  You have a fever. Get help right away if:  You have pus or a bad smell coming from your incisions.  You have chest pain.  You have problems breathing. Summary  Pain, swelling, and bruising are common after the procedure.  Check your incision area every day for signs of infection, such as more redness, swelling, or pain.  Put ice on painful or swollen areas for 20 minutes, 2-3 times a day. This information is not intended to replace advice given to you by your health care provider. Make sure you discuss any questions you have with your health care provider. Document Released: 08/18/2016 Document Revised: 08/18/2016 Document Reviewed: 08/18/2016 Elsevier Interactive Patient Education  2019 [place] Anesthesia, Adult, Care After This sheet gives you information about how to care for yourself after your procedure. Your health care provider may also give you more specific  instructions. If you have problems or questions, contact your health care provider. What can I expect after the procedure? After the procedure, the following side effects are common:  Pain or discomfort at the IV site.  Nausea.  Vomiting.  Sore throat.  Trouble concentrating.  Feeling cold or chills.  Weak or tired.  Sleepiness and fatigue.  Soreness and body aches. These side effects can affect parts of the body that were not involved in surgery. Follow these instructions at home:  For at least 24 hours after the procedure:  Have a responsible adult stay with you. It is important to have someone help care for you until you are awake and alert.  Rest as needed.  Do not: ? Participate in activities in which you could fall or become injured. ? Drive. ? Use heavy machinery. ? Drink alcohol. ? Take sleeping pills or medicines that cause drowsiness. ? Make important decisions or sign legal documents. ? Take care of children on  your own. Eating and drinking  Follow any instructions from your health care provider about eating or drinking restrictions.  When you feel hungry, start by eating small amounts of foods that are soft and easy to digest (bland), such as toast. Gradually return to your regular diet.  Drink enough fluid to keep your urine pale yellow.  If you vomit, rehydrate by drinking water, juice, or clear broth. General instructions  If you have sleep apnea, surgery and certain medicines can increase your risk for breathing problems. Follow instructions from your health care provider about wearing your sleep device: ? Anytime you are sleeping, including during daytime naps. ? While taking prescription pain medicines, sleeping medicines, or medicines that make you drowsy.  Return to your normal activities as told by your health care provider. Ask your health care provider what activities are safe for you.  Take over-the-counter and prescription medicines only as told by your health care provider.  If you smoke, do not smoke without supervision.  Keep all follow-up visits as told by your health care provider. This is important. Contact a health care provider if:  You have nausea or vomiting that does not get better with medicine.  You cannot eat or drink without vomiting.  You have pain that does not get better with medicine.  You are unable to pass urine.  You develop a skin rash.  You have a fever.  You have redness around your IV site that gets worse. Get help right away if:  You have difficulty breathing.  You have chest pain.  You have blood in your urine or stool, or you vomit blood. Summary  After the procedure, it is common to have a sore throat or nausea. It is also common to feel tired.  Have a responsible adult stay with you for the first 24 hours after general anesthesia. It is important to have someone help care for you until you are awake and alert.  When you feel  hungry, start by eating small amounts of foods that are soft and easy to digest (bland), such as toast. Gradually return to your regular diet.  Drink enough fluid to keep your urine pale yellow.  Return to your normal activities as told by your health care provider. Ask your health care provider what activities are safe for you. This information is not intended to replace advice given to you by your health care provider. Make sure you discuss any questions you have with your health care provider. Document Released: 08/15/2000  Document Revised: 12/23/2016 Document Reviewed: 12/23/2016 Elsevier Interactive Patient Education  [employer].

## 2018-07-20 NOTE — Anesthesia Preprocedure Evaluation (Signed)
Anesthesia Evaluation  Patient identified by MRN, date of birth, ID band Patient awake    Reviewed: Allergy & Precautions, H&P , NPO status , Patient's Chart, lab work & pertinent test results  Airway Mallampati: II  TM Distance: >3 FB Neck ROM: full  Mouth opening: Limited Mouth Opening  Dental no notable dental hx. (+) Edentulous Upper, Edentulous Lower   Pulmonary asthma , sleep apnea and Continuous Positive Airway Pressure Ventilation , COPD, Current Smoker,    Pulmonary exam normal breath sounds clear to auscultation       Cardiovascular Exercise Tolerance: Good hypertension, + CAD   Rhythm:regular Rate:Normal     Neuro/Psych PSYCHIATRIC DISORDERS Anxiety Depression  Neuromuscular disease    GI/Hepatic Neg liver ROS, GERD  ,  Endo/Other  negative endocrine ROS  Renal/GU negative Renal ROS  negative genitourinary   Musculoskeletal   Abdominal   Peds  Hematology negative hematology ROS (+)   Anesthesia Other Findings   Reproductive/Obstetrics negative OB ROS                             Anesthesia Physical Anesthesia Plan  ASA: III  Anesthesia Plan: General   Post-op Pain Management:    Induction:   PONV Risk Score and Plan:   Airway Management Planned:   Additional Equipment:   Intra-op Plan:   Post-operative Plan:   Informed Consent: I have reviewed the patients History and Physical, chart, labs and discussed the procedure including the risks, benefits and alternatives for the proposed anesthesia with the patient or authorized representative who has indicated his/her understanding and acceptance.     Dental Advisory Given  Plan Discussed with: CRNA  Anesthesia Plan Comments:         Anesthesia Quick Evaluation

## 2018-07-20 NOTE — Anesthesia Procedure Notes (Signed)
Procedure Name: LMA Insertion Date/Time: 07/20/2018 8:13 AM Performed by: Ollen Bowl, CRNA Pre-anesthesia Checklist: Patient identified, Patient being monitored, Emergency Drugs available, Timeout performed and Suction available Patient Re-evaluated:Patient Re-evaluated prior to induction Oxygen Delivery Method: Circle System Utilized Preoxygenation: Pre-oxygenation with 100% oxygen Induction Type: IV induction Ventilation: Mask ventilation without difficulty LMA: LMA inserted LMA Size: 4.0 Number of attempts: 1 Placement Confirmation: positive ETCO2 and breath sounds checked- equal and bilateral

## 2018-07-20 NOTE — Op Note (Signed)
[facility] Operative Note  07/20/18  Preoperative Diagnosis: Recurrent left inguinal hernia    Postoperative Diagnosis: Recurrent left indirect inguinal hernia and left femoral hernia    Procedure(s) Performed: Left inguinal hernia repair with plug and mesh and left femoral hernia repair with mesh plug    Surgeon: Lanell Matar. Constance Haw, MD   Assistants: Aviva Signs, MD   Anesthesia: General endotracheal   Anesthesiologist:  Dr. Rick Duff, MD    Specimens: None    Estimated Blood Loss: Minimal   Blood Replacement: None    Complications: None    Wound Class: Clean    Operative Indications: [name] is a 68 yo with a history of a left inguinal hernia repair > 25 years ago that we think is likely repaired with tissue only, who presents with a new bulge and worsening discomfort. He says that he is able to push the area in and get some relief, but that the pain is getting worse. He denied any obstructive symptoms, and have a bulge on exam.  We discussed the risk and benefits of surgery including but not limited to bleeding, infection, use of mesh, chance of recurrence even with mesh, nerve injury, and pain, and he opted to proceed.    Findings: Small recurrent indirect left inguinal hernia and femoral hernia containing fat    Procedure: The patient was taken to the operating room and placed supine. General endotracheal anesthesia was induced. Intravenous antibiotics were administered per protocol.  A time out was preformed verifying the correct patient, procedure, site, positioning and implants.  The left groin and scrotum were prepared and draped in the usual sterile fashion.   An incision was made at the prior incision which was between the pubic tubercle and the anterior superior iliac spine.  The incision was deepened with electrocautery through the subcutaneous tissue, and no true Scarpa's or Camper's fascia was appreciated.  Dissection was performed until the  aponeurosis of the external oblique was encountered.  This was cleaned and the external ring was exposed.  An incision was made in the midportion of the external oblique aponeurosis in the direction of its fibers. There was some significant scarring and fibrosis of the external oblique to the underlying contents. The ilioinguinal nerve was not identified given his prior operation and scarring.  With blunt and electrocautery dissection, flaps of the external oblique were developed cephalad and inferiorly.    The cord was identified and it was gently dissected free from the shelving edge and the pubic tubercle and encircled with a Penrose drain. There were prolene sutures from his prior repair, but these were only in the shelving edge and whatever was originally in the conjoint tendon was gone as I would assume he had a Bassini type repair. On clearing the cord from the shelving edge, I noted a bulge inferiorly overlying the femoral canal.  This was consistent with a femoral type hernia.  At this point, I wanted to further assess the cord structures and identify any indirect recurrence as the floor of the inguinal canal was intact but lax.  Attention was then directed at the anteromedial aspect of the cord, where a small indirect hernia sac was identified that contained some omentum.  The vas and testicular vessels were identified and protected from harm.  Once the sac was dissected free from the cords, the Penrose was placed around the cord which was retracted inferiorly out of the field of view.  Dr. Arnoldo Morale scrubbed to assess the anatomy as  a second opinion and agreed there was a small indirect recurrence and the femoral hernia noted inferiorly. The sac was carefully dissected free from the cord down to the level of the internal ring, but given the prior scarring and fibrosis, I had difficulty getting everything reduced.  A 2-0 silk suture ligation of the sac was performed, and the remain sac was placed into the  internal ring.  A small Perfix Plug was placed into the defect and filled the space of the internal ring.    Again the floor of the canal  was grossly weakened without any defined defect or sac, but inferior to the inguinal ligament was again noted and consistent with the femoral hernia defect.  There was a large appendage of what appeared to be preperitoneal fat in the hernia defect.  The defect itself was felt to be smaller than my finger.  The fat was suture ligated with a 2-0 silk suture, and the contents were reduced into defect overlying the femoral canal/ vessels.  A small Perfix plug was trimmed down to make it thinner, and placed into this defect.  Given the proximity to the vessels and small size of the defect, I did not feel that this plug would move and did not seem to require any additional suturing to keep it in place. This was placed deep into the canal space.  The Perfix Mesh Patch was sutured to the inguinal ligament inferiorly starting at the pubic tubercle using 2-0 Novafil interrupted sutures.  The mesh was sutured superiorly to the remaining conjoint tendon using 2-0 Novafil interrupted sutures.  Care was taken to ensure the mesh was placed in a relaxed fashion to avoid excessive tension and no neurovascular structures were caught in the repair.  Laterally the tails of the mesh were crossed and the internal ring was recreated, allowing for passage of cords without tension.   Hemostasis was adequate.  The Penrose was removed.  The external oblique aponeurosis was closed with a 2-0 Vicryl suture in a running fashion.  The subcutaneous tissue was was closed with 3-0 Vicryl interrupted sutures. The skin was closed with a subcuticular 4-0 Monocryl suture.  Dermabond was applied.   The testis was gently pulled down into its anatomic position in the scrotum.  The patient tolerated the procedure well and was taken to the PACU in stable condition. All counts were correct at the end of the case.         Curlene Labrum, MD [facility] [address] [place], [place] [address] [phone] (office)

## 2018-07-23 ENCOUNTER — Encounter (HOSPITAL_COMMUNITY): Payer: Self-pay | Admitting: General Surgery

## 2018-08-02 ENCOUNTER — Other Ambulatory Visit: Payer: Self-pay

## 2018-08-02 ENCOUNTER — Encounter: Payer: Self-pay | Admitting: General Surgery

## 2018-08-02 ENCOUNTER — Ambulatory Visit (INDEPENDENT_AMBULATORY_CARE_PROVIDER_SITE_OTHER): Payer: Self-pay | Admitting: General Surgery

## 2018-08-02 VITALS — BP 110/78 | HR 78 | Temp 99.1°F | Resp 18 | Wt 199.4 lb

## 2018-08-02 DIAGNOSIS — K4091 Unilateral inguinal hernia, without obstruction or gangrene, recurrent: Secondary | ICD-10-CM

## 2018-08-02 DIAGNOSIS — K4190 Unilateral femoral hernia, without obstruction or gangrene, not specified as recurrent: Secondary | ICD-10-CM

## 2018-08-02 NOTE — Progress Notes (Signed)
[facility] Note   HPI:  68 y.o. Male presents to clinic for post-op follow-up evaluation a left inguinal and femoral hernia repair. Patient reports he is doing well. He had some bruising and swelling but this has resolved.   Review of Systems:  Pain improving No issues with urination Diet going well  All other review of systems: otherwise negative   Vital Signs:  BP 110/78 (BP Location: Left Arm, Patient Position: Sitting, Cuff Size: Large)   Pulse 78   Temp 99.1 F (37.3 C) (Temporal)   Resp 18   Wt 199 lb 6.4 oz (90.4 kg)   BMI 31.23 kg/m    Physical Exam:  Physical Exam Vitals signs reviewed.  HENT:     Head: Normocephalic.  Cardiovascular:     Rate and Rhythm: Normal rate.  Pulmonary:     Effort: Pulmonary effort is normal.  Abdominal:     General: There is no distension.     Palpations: Abdomen is soft.     Tenderness: There is no abdominal tenderness.     Hernia: No hernia is present.     Comments: Left inguinal incision c/d/i with no erythema or drainage, mild induration, mild tenderness  Neurological:     Mental Status: He is alert.     Assessment:  68 y.o. yo Male s/p left inguinal and femoral hernia repair. Doing well. Feeling better.   Plan:  - Continue activity restrictions for 6-8 weeks after surgery   - Tylenol and ibuprofen for pain  - Induration will improve with time  - Follow up PRN   All of the above recommendations were discussed with the patient, and all of patient's questions were answered to his expressed satisfaction.  Curlene Labrum, MD [facility] [address] [place], [place] [address] [phone] (office)

## 2018-08-02 NOTE — Patient Instructions (Signed)
No heavy lifting > 10 lbs, no excessive squatting, bending, pushing, pulling for the next 6-8 weeks.  Roll on your side when getting in and out of bed to prevent strain on your abdomen.

## 2018-08-07 DIAGNOSIS — Z6832 Body mass index (BMI) 32.0-32.9, adult: Secondary | ICD-10-CM | POA: Diagnosis not present

## 2018-08-07 DIAGNOSIS — E6609 Other obesity due to excess calories: Secondary | ICD-10-CM | POA: Diagnosis not present

## 2018-08-07 DIAGNOSIS — G894 Chronic pain syndrome: Secondary | ICD-10-CM | POA: Diagnosis not present

## 2018-09-24 DIAGNOSIS — J984 Other disorders of lung: Secondary | ICD-10-CM | POA: Diagnosis not present

## 2018-09-24 DIAGNOSIS — R1011 Right upper quadrant pain: Secondary | ICD-10-CM | POA: Diagnosis not present

## 2018-09-24 DIAGNOSIS — J449 Chronic obstructive pulmonary disease, unspecified: Secondary | ICD-10-CM | POA: Diagnosis not present

## 2018-09-24 DIAGNOSIS — F17200 Nicotine dependence, unspecified, uncomplicated: Secondary | ICD-10-CM | POA: Diagnosis not present

## 2018-10-05 DIAGNOSIS — K219 Gastro-esophageal reflux disease without esophagitis: Secondary | ICD-10-CM | POA: Diagnosis not present

## 2018-10-05 DIAGNOSIS — Z6832 Body mass index (BMI) 32.0-32.9, adult: Secondary | ICD-10-CM | POA: Diagnosis not present

## 2018-10-05 DIAGNOSIS — G894 Chronic pain syndrome: Secondary | ICD-10-CM | POA: Diagnosis not present

## 2018-10-05 DIAGNOSIS — J449 Chronic obstructive pulmonary disease, unspecified: Secondary | ICD-10-CM | POA: Diagnosis not present

## 2018-10-17 ENCOUNTER — Other Ambulatory Visit: Payer: Self-pay | Admitting: Cardiology

## 2018-11-05 ENCOUNTER — Telehealth: Payer: Self-pay | Admitting: Pulmonary Disease

## 2018-11-05 NOTE — Telephone Encounter (Signed)
Per Rodena Piety, she had scheduled the patient for a LDCT. She already spoke with patient.   Will close this encounter.

## 2018-11-08 DIAGNOSIS — L820 Inflamed seborrheic keratosis: Secondary | ICD-10-CM | POA: Diagnosis not present

## 2018-11-08 DIAGNOSIS — X32XXXD Exposure to sunlight, subsequent encounter: Secondary | ICD-10-CM | POA: Diagnosis not present

## 2018-11-08 DIAGNOSIS — L570 Actinic keratosis: Secondary | ICD-10-CM | POA: Diagnosis not present

## 2018-11-27 DIAGNOSIS — I2510 Atherosclerotic heart disease of native coronary artery without angina pectoris: Secondary | ICD-10-CM | POA: Diagnosis not present

## 2018-11-27 DIAGNOSIS — I10 Essential (primary) hypertension: Secondary | ICD-10-CM | POA: Diagnosis not present

## 2018-11-27 DIAGNOSIS — F17200 Nicotine dependence, unspecified, uncomplicated: Secondary | ICD-10-CM | POA: Diagnosis not present

## 2018-11-27 DIAGNOSIS — J449 Chronic obstructive pulmonary disease, unspecified: Secondary | ICD-10-CM | POA: Diagnosis not present

## 2018-12-03 DIAGNOSIS — J449 Chronic obstructive pulmonary disease, unspecified: Secondary | ICD-10-CM | POA: Diagnosis not present

## 2018-12-03 DIAGNOSIS — I10 Essential (primary) hypertension: Secondary | ICD-10-CM | POA: Diagnosis not present

## 2018-12-03 DIAGNOSIS — Z1389 Encounter for screening for other disorder: Secondary | ICD-10-CM | POA: Diagnosis not present

## 2018-12-03 DIAGNOSIS — K219 Gastro-esophageal reflux disease without esophagitis: Secondary | ICD-10-CM | POA: Diagnosis not present

## 2018-12-03 DIAGNOSIS — Z6823 Body mass index (BMI) 23.0-23.9, adult: Secondary | ICD-10-CM | POA: Diagnosis not present

## 2018-12-03 DIAGNOSIS — Z0001 Encounter for general adult medical examination with abnormal findings: Secondary | ICD-10-CM | POA: Diagnosis not present

## 2018-12-04 ENCOUNTER — Ambulatory Visit (HOSPITAL_COMMUNITY)
Admission: RE | Admit: 2018-12-04 | Discharge: 2018-12-04 | Disposition: A | Discharge status: Home / Self Care | Payer: Medicare Other | Source: Ambulatory Visit | Attending: Acute Care | Admitting: Acute Care

## 2018-12-04 ENCOUNTER — Other Ambulatory Visit: Payer: Self-pay

## 2018-12-04 DIAGNOSIS — Z122 Encounter for screening for malignant neoplasm of respiratory organs: Secondary | ICD-10-CM | POA: Insufficient documentation

## 2018-12-04 DIAGNOSIS — F17210 Nicotine dependence, cigarettes, uncomplicated: Secondary | ICD-10-CM | POA: Diagnosis not present

## 2018-12-06 ENCOUNTER — Other Ambulatory Visit: Payer: Self-pay | Admitting: *Deleted

## 2018-12-06 DIAGNOSIS — Z87891 Personal history of nicotine dependence: Secondary | ICD-10-CM

## 2018-12-06 DIAGNOSIS — Z122 Encounter for screening for malignant neoplasm of respiratory organs: Secondary | ICD-10-CM

## 2018-12-06 DIAGNOSIS — F17210 Nicotine dependence, cigarettes, uncomplicated: Secondary | ICD-10-CM

## 2018-12-24 ENCOUNTER — Ambulatory Visit (INDEPENDENT_AMBULATORY_CARE_PROVIDER_SITE_OTHER): Payer: Medicare Other | Admitting: Cardiology

## 2018-12-24 ENCOUNTER — Encounter: Payer: Self-pay | Admitting: Cardiology

## 2018-12-24 ENCOUNTER — Other Ambulatory Visit: Payer: Self-pay

## 2018-12-24 VITALS — BP 110/78 | HR 82 | Temp 97.3°F | Ht 66.0 in | Wt 204.0 lb

## 2018-12-24 DIAGNOSIS — R55 Syncope and collapse: Secondary | ICD-10-CM | POA: Diagnosis not present

## 2018-12-24 DIAGNOSIS — I25119 Atherosclerotic heart disease of native coronary artery with unspecified angina pectoris: Secondary | ICD-10-CM

## 2018-12-24 DIAGNOSIS — I10 Essential (primary) hypertension: Secondary | ICD-10-CM | POA: Diagnosis not present

## 2018-12-24 DIAGNOSIS — Z720 Tobacco use: Secondary | ICD-10-CM

## 2018-12-24 NOTE — Progress Notes (Signed)
Cardiology Office Note  Date: 12/24/2018   ID: [name], [name] 12-18-1950, MRN [id number]  PCP:  Redmond School, MD  Cardiologist:  Rozann Lesches, MD Electrophysiologist:  None   Chief Complaint  Patient presents with  . Cardiac follow-up    History of Present Illness: [name] is a 68 y.o. male last seen in January.  He presents for a follow-up visit.  He does not report any obvious angina symptoms or increasing shortness of breath.  He tells me that he has been having intermittent episodes of lightheadedness, tends to be fairly sudden in onset, can occur when seated or standing, no obvious precipitant or feeling of palpitations.  Symptoms lasted from 5 seconds to 15 seconds.  He has never had frank syncope.  He has had interval follow-up with Dr. Gerarda Fraction.  I reviewed the note.  We went over his medications which are stable from a cardiac perspective and outlined below.  He has not used any nitroglycerin.  I personally reviewed his ECG today which shows sinus rhythm with low voltage.  Past Medical History:  Diagnosis Date  . Allergic rhinitis   . Anxiety and depression   . Asthma   . CAD (coronary artery disease)    Mild to moderate nonobstructive LAD disease 2006  . Chronic low back pain   . COPD (chronic obstructive pulmonary disease) ([place])   . Dyslipidemia   . Essential hypertension   . GERD (gastroesophageal reflux disease)   . Hearing loss   . Hemorrhoids   . Hyperplastic colon polyp 12/28/10  . IBS (irritable bowel syndrome)   . 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 Mitral regurgitation   . OSA on CPAP   . Tubular adenoma 12/28/10    Past Surgical History:  Procedure Laterality Date  . CHOLECYSTECTOMY    . COLONOSCOPY  2007   friable anal canal, hyperplastic polyp  . COLONOSCOPY  12/28/2010   Dr. Gala Romney- tubular adenoma, hyperplastic polyp  . COLONOSCOPY N/A 01/16/2015   Rourk: anal canal and internal hemorrhois. next surveillance colonoscopy  12/2019  . EAR PINNA RECONSTRUCTION W/ RIB GRAFT     right  . ESOPHAGOGASTRODUODENOSCOPY     multiple dilations, last EGD 2007 showed small hh, adenomatous appearing gastric mucosa in body but biopsies benign. SB bx negative for Celiac.  Marland Kitchen ESOPHAGOGASTRODUODENOSCOPY  12/28/2010   Dr. Gala Romney- normal esophagus s/p dilationpatchy erythema and erosions.  Marland Kitchen FOOT SURGERY     right  . INGUINAL HERNIA REPAIR     left  . INGUINAL HERNIA REPAIR Left 07/20/2018   Procedure: LEFT INDIRECT INGUINAL AND FEMORAL HERNIA REPAIR WITH MESH;  Surgeon: Virl Cagey, MD;  Location: [facility];  Service: General;  Laterality: Left;  . LUNG BIOPSY  2009   right  . MALONEY DILATION  12/28/2010   Procedure: Venia Minks DILATION;  Surgeon: Daneil Dolin, MD;  Location: [facility];  Service: Endoscopy;  Laterality: N/A;  . TONSILLECTOMY    . TRANSTHORACIC ECHOCARDIOGRAM  02/2011   EF =>55%, normal chamber size & function; mild mitral and aortic insuff, mild-mod tricuspid insuff; mild pulm htn with rsvp of 107mmHg    Current Outpatient Medications  Medication Sig Dispense Refill  . albuterol (PROVENTIL HFA;VENTOLIN HFA) 108 (90 Base) MCG/ACT inhaler Inhale 2 puffs into the lungs every 6 (six) hours as needed for wheezing. 1 Inhaler 5  . albuterol (PROVENTIL) 2 MG tablet Take 2 mg by mouth 4 (four) times daily.   2  .  aspirin 81 MG tablet Take 81 mg by mouth daily.      . fenofibrate (TRICOR) 145 MG tablet Take 145 mg by mouth at bedtime.     . fish oil-omega-3 fatty acids 1000 MG capsule Take 1 g by mouth 2 (two) times daily.     Marland Kitchen glucosamine-chondroitin 500-400 MG tablet Take 1 tablet by mouth 2 (two) times daily.    Marland Kitchen HYDROcodone-acetaminophen (NORCO) 10-325 MG per tablet Take 1 tablet by mouth 3 (three) times daily as needed for moderate pain.     Marland Kitchen losartan (COZAAR) 100 MG tablet Take 1 tablet (100 mg total) by mouth daily. 90 tablet 2  . Multiple Vitamin (MULTIVITAMIN) capsule Take 1 capsule by mouth daily.       . nitroGLYCERIN (NITROSTAT) 0.4 MG SL tablet Place 1 tablet (0.4 mg total) under the tongue every 5 (five) minutes as needed for chest pain. 25 tablet 3  . omeprazole (PRILOSEC) 20 MG capsule Take 20 mg by mouth daily.   2  . simvastatin (ZOCOR) 20 MG tablet Take 1 tablet by mouth once daily 90 tablet 0  . umeclidinium-vilanterol (ANORO ELLIPTA) 62.5-25 MCG/INH AEPB Inhale 1 puff into the lungs daily. 2 each 0   No current facility-administered medications for this visit.    Allergies:  Bee venom   Social History: The patient  reports that he has been smoking cigarettes. He started smoking about 40 years ago. He has a 18.50 pack-year smoking history. He has never used smokeless tobacco. He reports that he does not drink alcohol or use drugs.   ROS:  Please see the history of present illness. Otherwise, complete review of systems is positive for none.  All other systems are reviewed and negative.   Physical Exam: VS:  BP 110/78   Pulse 82   Temp (!) 97.3 F (36.3 C)   Ht 5\' 6"  (1.676 m)   Wt 204 lb (92.5 kg)   SpO2 98%   BMI 32.93 kg/m , BMI Body mass index is 32.93 kg/m.  Wt Readings from Last 3 Encounters:  12/24/18 204 lb (92.5 kg)  08/02/18 199 lb 6.4 oz (90.4 kg)  07/20/18 200 lb (90.7 kg)    General: Patient appears comfortable at rest. HEENT: Conjunctiva and lids normal, wearing a mask. Neck: Supple, no elevated JVP or carotid bruits, no thyromegaly. Lungs: Clear to auscultation, nonlabored breathing at rest. Cardiac: Regular rate and rhythm, no S3 or significant systolic murmur. Abdomen: Soft, nontender, bowel sounds present. Extremities: No pitting edema, distal pulses 2+. Skin: Warm and dry. Musculoskeletal: No kyphosis. Neuropsychiatric: Alert and oriented x3, affect grossly appropriate.  ECG:  An ECG dated 11/30/2017 was personally reviewed today and demonstrated:  Sinus rhythm.  Recent Labwork: 07/05/2018: BUN 17; Creatinine, Ser 1.17; Potassium 4.1; Sodium 140    Other Studies Reviewed Today:  Lexiscan Myoview 04/28/2015:  The left ventricular ejection fraction is normal (55-65%).  Nuclear stress EF: 63%.  There was no ST segment deviation noted during stress.  The study is normal.  Normal stress nuclear study with a small, mild, fixed inferior septal defect consistent with thinning; no ischemia; EF 63 with normal wall motion.  Chest CT 12/04/2018: FINDINGS: Cardiovascular: The heart is normal in size. No pericardial effusion.  No evidence thoracic aortic aneurysm. Mild atherosclerotic calcification of the aortic arch.  Mild three-vessel coronary atherosclerosis.  Mediastinum/Nodes: No suspicious mediastinal lymphadenopathy.  Visualized thyroid is unremarkable.  Lungs/Pleura: Mild centrilobular emphysematous changes, upper lung predominant.  Mild subpleural  reticulation/fibrosis in the lungs bilaterally, suggesting superimposed chronic interstitial lung disease.  No focal consolidation.  Numerous scattered bilateral pulmonary nodules, measuring up to 6.2 mm, most of which are unchanged. A few bilateral pulmonary nodules are no longer visualized. A few new pulmonary nodules measuring less than 4 mm were not conspicuous on the prior. Overall, these findings are can be considered benign.  No pleural effusion or pneumothorax.  Upper Abdomen: Visualized upper abdomen is grossly unremarkable, noting prior cholecystectomy and a benign left adrenal adenoma.  Musculoskeletal: Degenerative changes of the visualized thoracolumbar spine.  IMPRESSION: Lung-RADS 2, benign appearance or behavior. Continue annual screening with low-dose chest CT without contrast in 12 months.  Aortic Atherosclerosis (ICD10-I70.0) and Emphysema (ICD10-J43.9).  Assessment and Plan:  1.  Intermittent episodes of lightheadedness/near syncope.  No obvious precipitants or associated palpitations/chest pain.  ECG reviewed today shows normal  sinus rhythm.  We will obtain a 7-day ZIO AT monitor.  2.  Mild to moderate nonobstructive CAD by cardiac catheterization in 2006.  More recent screening chest CT described mild multivessel coronary atherosclerosis.  He does not report any obvious angina.  Continue medical therapy and observation.  He is on aspirin and statin.  3.  Tobacco abuse, ongoing.  We have discussed smoking cessation strategies.  4.  Essential hypertension, blood pressure is well controlled today.  Medication Adjustments/Labs and Tests Ordered: Current medicines are reviewed at length with the patient today.  Concerns regarding medicines are outlined above.   Tests Ordered: Orders Placed This Encounter  Procedures  . EKG 12-Lead    Medication Changes: No orders of the defined types were placed in this encounter.   Disposition:  Follow up test results and determine disposition.  Signed, Satira Sark, MD, [facility] 12/24/2018 2:24 PM    [facility] [place], [place],  [address] Phone: [phone]; Fax: [phone]

## 2018-12-24 NOTE — Patient Instructions (Signed)
Medication Instructions:  °Continue all current medications. ° °Labwork: °none ° °Testing/Procedures: °Your physician has recommended that you wear a 7 day event monitor. Event monitors are medical devices that record the heart’s electrical activity. Doctors most often us these monitors to diagnose arrhythmias. Arrhythmias are problems with the speed or rhythm of the heartbeat. The monitor is a small, portable device. You can wear one while you do your normal daily activities. This is usually used to diagnose what is causing palpitations/syncope (passing out). °Office will contact with results via phone or letter.    ° °Follow-Up: °Pending test results  ° °Any Other Special Instructions Will Be Listed Below (If Applicable). ° ° °If you need a refill on your cardiac medications before your next appointment, please call your pharmacy. ° °

## 2019-01-04 ENCOUNTER — Telehealth: Payer: Self-pay | Admitting: *Deleted

## 2019-01-04 NOTE — Telephone Encounter (Signed)
Received call from [facility] stating that the pt declined to wear monitor and has mailed it back to the company.

## 2019-01-09 DIAGNOSIS — Z6823 Body mass index (BMI) 23.0-23.9, adult: Secondary | ICD-10-CM | POA: Diagnosis not present

## 2019-01-09 DIAGNOSIS — G894 Chronic pain syndrome: Secondary | ICD-10-CM | POA: Diagnosis not present

## 2019-01-29 ENCOUNTER — Other Ambulatory Visit: Payer: Self-pay | Admitting: Cardiology

## 2019-01-29 DIAGNOSIS — K148 Other diseases of tongue: Secondary | ICD-10-CM | POA: Diagnosis not present

## 2019-02-20 ENCOUNTER — Ambulatory Visit: Payer: Medicare Other | Admitting: Critical Care Medicine

## 2019-02-20 ENCOUNTER — Encounter: Payer: Self-pay | Admitting: Critical Care Medicine

## 2019-02-20 ENCOUNTER — Other Ambulatory Visit: Payer: Self-pay

## 2019-02-20 VITALS — BP 116/72 | HR 67 | Temp 98.7°F | Ht 66.0 in | Wt 202.6 lb

## 2019-02-20 DIAGNOSIS — Z23 Encounter for immunization: Secondary | ICD-10-CM | POA: Diagnosis not present

## 2019-02-20 DIAGNOSIS — J432 Centrilobular emphysema: Secondary | ICD-10-CM

## 2019-02-20 DIAGNOSIS — Z87891 Personal history of nicotine dependence: Secondary | ICD-10-CM | POA: Diagnosis not present

## 2019-02-20 DIAGNOSIS — R918 Other nonspecific abnormal finding of lung field: Secondary | ICD-10-CM | POA: Diagnosis not present

## 2019-02-20 DIAGNOSIS — J449 Chronic obstructive pulmonary disease, unspecified: Secondary | ICD-10-CM | POA: Diagnosis not present

## 2019-02-20 MED ORDER — ANORO ELLIPTA 62.5-25 MCG/INH IN AEPB: 1.0000 | INHALATION_SPRAY | Freq: Every day | RESPIRATORY_TRACT | 11 refills | Status: DC

## 2019-02-20 MED ORDER — VARENICLINE TARTRATE 1 MG PO TABS: 1.0000 mg | ORAL_TABLET | Freq: Two times a day (BID) | ORAL | 2 refills | Status: DC

## 2019-02-20 MED ORDER — VARENICLINE TARTRATE 0.5 MG PO TABS: 0.5000 mg | ORAL_TABLET | Freq: Two times a day (BID) | ORAL | 0 refills | Status: DC

## 2019-02-20 MED ORDER — ALBUTEROL SULFATE HFA 108 (90 BASE) MCG/ACT IN AERS: 2.0000 | INHALATION_SPRAY | Freq: Four times a day (QID) | RESPIRATORY_TRACT | 6 refills | Status: AC | PRN

## 2019-02-20 NOTE — Progress Notes (Signed)
Synopsis: Referred in for 2015 for COPD by Redmond School, MD.  He is a patient of Dr. Lake Bells.  Subjective:   PATIENT ID: [name] GENDER: male DOB: 08-26-1950, MRN: [id number]  Chief Complaint  Patient presents with  . Follow-up    68yr f/u for emphysema. States his breathing has ok since last visit.     [name] is a 68 year old gentleman with a history of tobacco abuse and COPD.  He has been using his Anoro inhaler daily without missed doses.  His symptoms are at his baseline, with mild dyspnea on exertion, occasional morning cough with thick white sputum, and infrequent wheezing.  He seldom uses his albuterol, less than once per month.  At baseline he can walk about a mile on flat ground if he is not carrying anything before he has to stop due to dyspnea.  He is more limited when he is carrying something heavy.  He began smoking at the age of 68, smoking 1 pack/day at his heaviest, and had been working on cutting back.  He continues to smoke ~ 1 ppd, and increased due to stress recently.  In the past he successfully quit for a few months using nicotine replacement therapy.  He has never tried Chantix, but would be willing to.  He has undergone lung cancer screening CT, and is being followed for multiple sub-centimeter pulmonary nodules.  He has not yet had his seasonal flu vaccine.  He has been doing well social distancing, handwashing, mask wearing, but has been exposed to household family members who have not been respectful of these recommendations, which is been a source of great frustration for him.    Past Medical History:  Diagnosis Date  . Allergic rhinitis   . Anxiety and depression   . Asthma   . CAD (coronary artery disease)    Mild to moderate nonobstructive LAD disease 2006  . Chronic low back pain   . COPD (chronic obstructive pulmonary disease) ([place])   . Dyslipidemia   . Essential hypertension   . GERD (gastroesophageal reflux disease)   . Hearing loss    . Hemorrhoids   . Hyperplastic colon polyp 12/28/10  . IBS (irritable bowel syndrome)   . 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 Mitral regurgitation   . OSA on CPAP   . Tubular adenoma 12/28/10     Family History  Problem Relation Age of Onset  . Heart disease Father 62       etoh/breathing problmes  . COPD Father   . Heart attack Maternal Grandfather   . Diabetes Paternal Grandmother   . COPD Paternal Grandmother   . Heart Problems Paternal Grandfather   . GI problems Mother   . Hyperlipidemia Mother   . COPD Mother   . Heart attack Maternal Grandmother        also valvular problems  . Heart attack Brother        stenting x3, also cancer  . Skin cancer Brother   . Hypertension Brother   . Hypertension Sister   . Colon cancer Neg Hx   . Liver disease Neg Hx   . Inflammatory bowel disease Neg Hx      Past Surgical History:  Procedure Laterality Date  . CHOLECYSTECTOMY    . COLONOSCOPY  2007   friable anal canal, hyperplastic polyp  . COLONOSCOPY  12/28/2010   Dr. Gala Romney- tubular adenoma, hyperplastic polyp  . COLONOSCOPY N/A 01/16/2015   Rourk: anal canal and internal  hemorrhois. next surveillance colonoscopy 12/2019  . EAR PINNA RECONSTRUCTION W/ RIB GRAFT     right  . ESOPHAGOGASTRODUODENOSCOPY     multiple dilations, last EGD 2007 showed small hh, adenomatous appearing gastric mucosa in body but biopsies benign. SB bx negative for Celiac.  Marland Kitchen ESOPHAGOGASTRODUODENOSCOPY  12/28/2010   Dr. Gala Romney- normal esophagus s/p dilationpatchy erythema and erosions.  Marland Kitchen FOOT SURGERY     right  . INGUINAL HERNIA REPAIR     left  . INGUINAL HERNIA REPAIR Left 07/20/2018   Procedure: LEFT INDIRECT INGUINAL AND FEMORAL HERNIA REPAIR WITH MESH;  Surgeon: Virl Cagey, MD;  Location: [facility];  Service: General;  Laterality: Left;  . LUNG BIOPSY  2009   right  . MALONEY DILATION  12/28/2010   Procedure: Venia Minks DILATION;  Surgeon: Daneil Dolin, MD;  Location: [facility];  Service:  Endoscopy;  Laterality: N/A;  . TONSILLECTOMY    . TRANSTHORACIC ECHOCARDIOGRAM  02/2011   EF =>55%, normal chamber size & function; mild mitral and aortic insuff, mild-mod tricuspid insuff; mild pulm htn with rsvp of 4mmHg    Social History   Socioeconomic History  . Marital status: Married    Spouse name: Not on file  . Number of children: 1  . Years of education: Not on file  . Highest education level: Not on file  Occupational History  . Occupation: disability    Employer: UNEMPLOYED  Social Needs  . Financial resource strain: Not on file  . Food insecurity    Worry: Not on file    Inability: Not on file  . Transportation needs    Medical: Not on file    Non-medical: Not on file  Tobacco Use  . Smoking status: Current Every Day Smoker    Packs/day: 0.50    Years: 37.00    Pack years: 18.50    Types: Cigarettes    Start date: 05/23/1978  . Smokeless tobacco: Never Used  . Tobacco comment: down to 0.25ppd  Substance and Sexual Activity  . Alcohol use: No    Alcohol/week: 0.0 standard drinks  . Drug use: No  . Sexual activity: Not on file  Lifestyle  . Physical activity    Days per week: Not on file    Minutes per session: Not on file  . Stress: Not on file  Relationships  . Social Herbalist on phone: Not on file    Gets together: Not on file    Attends religious service: Not on file    Active member of club or organization: Not on file    Attends meetings of clubs or organizations: Not on file    Relationship status: Not on file  . Intimate partner violence    Fear of current or ex partner: Not on file    Emotionally abused: Not on file    Physically abused: Not on file    Forced sexual activity: Not on file  Other Topics Concern  . Not on file  Social History Narrative   Son, age 50, hit by truck.   Drinks about 2-3 cups of coffee a day. Occasionally drinks tea.      Allergies  Allergen Reactions  . Bee Venom Swelling     Immunization  History  Administered Date(s) Administered  . Fluad Quad(high Dose 65+) 02/20/2019  . Influenza Split 04/09/2013, 04/09/2016  . Influenza,inj,Quad PF,6+ Mos 01/30/2017  . Influenza-Unspecified 03/08/2015  . Pneumococcal Conjugate-13 11/07/2013    Outpatient  Medications Prior to Visit  Medication Sig Dispense Refill  . aspirin 81 MG tablet Take 81 mg by mouth daily.      . fenofibrate (TRICOR) 145 MG tablet Take 145 mg by mouth at bedtime.     . fish oil-omega-3 fatty acids 1000 MG capsule Take 1 g by mouth 2 (two) times daily.     Marland Kitchen glucosamine-chondroitin 500-400 MG tablet Take 1 tablet by mouth 2 (two) times daily.    Marland Kitchen HYDROcodone-acetaminophen (NORCO) 10-325 MG per tablet Take 1 tablet by mouth 3 (three) times daily as needed for moderate pain.     Marland Kitchen losartan (COZAAR) 100 MG tablet Take 1 tablet (100 mg total) by mouth daily. 90 tablet 2  . Multiple Vitamin (MULTIVITAMIN) capsule Take 1 capsule by mouth daily.      . nitroGLYCERIN (NITROSTAT) 0.4 MG SL tablet Place 1 tablet (0.4 mg total) under the tongue every 5 (five) minutes as needed for chest pain. 25 tablet 3  . omeprazole (PRILOSEC) 20 MG capsule Take 20 mg by mouth daily.   2  . simvastatin (ZOCOR) 20 MG tablet Take 1 tablet by mouth once daily 90 tablet 0  . albuterol (PROVENTIL HFA;VENTOLIN HFA) 108 (90 Base) MCG/ACT inhaler Inhale 2 puffs into the lungs every 6 (six) hours as needed for wheezing. 1 Inhaler 5  . albuterol (PROVENTIL) 2 MG tablet Take 2 mg by mouth 4 (four) times daily.   2  . umeclidinium-vilanterol (ANORO ELLIPTA) 62.5-25 MCG/INH AEPB Inhale 1 puff into the lungs daily. 2 each 0   No facility-administered medications prior to visit.     Review of Systems  Constitutional: Negative for chills, fever, malaise/fatigue and weight loss.  HENT: Negative for congestion.        Chronic hearing loss, especially on the right  Eyes: Negative.   Respiratory: Positive for cough and sputum production. Negative for  hemoptysis, shortness of breath and wheezing.   Cardiovascular: Negative for chest pain, palpitations and leg swelling.  Gastrointestinal: Negative for diarrhea, heartburn, nausea and vomiting.  Genitourinary: Negative.   Musculoskeletal: Negative for joint pain and myalgias.  Skin: Negative for rash.  Neurological: Negative for tingling and headaches.  Endo/Heme/Allergies: Bruises/bleeds easily.  Psychiatric/Behavioral: Negative.      Objective:   Vitals:   02/20/19 1333  BP: 116/72  Pulse: 67  Temp: 98.7 F (37.1 C)  TempSrc: Temporal  SpO2: 98%  Weight: 202 lb 9.6 oz (91.9 kg)  Height: 5\' 6"  (1.676 m)   98% on   RA BMI Readings from Last 3 Encounters:  02/20/19 32.70 kg/m  12/24/18 32.93 kg/m  08/02/18 31.23 kg/m   Wt Readings from Last 3 Encounters:  02/20/19 202 lb 9.6 oz (91.9 kg)  12/24/18 204 lb (92.5 kg)  08/02/18 199 lb 6.4 oz (90.4 kg)    Physical Exam Vitals signs reviewed.  Constitutional:      Appearance: Normal appearance. He is not ill-appearing or diaphoretic.  HENT:     Head: Normocephalic and atraumatic.     Nose:     Comments: Deferred due to masking requirement.    Mouth/Throat:     Comments: Deferred due to masking requirement. Eyes:     General: No scleral icterus. Neck:     Musculoskeletal: Neck supple.  Cardiovascular:     Rate and Rhythm: Normal rate and regular rhythm.     Heart sounds: No murmur.  Pulmonary:     Comments: Clear to auscultation bilaterally, breathing comfortably on room  air.  No conversational dyspnea or tachypnea. Abdominal:     General: There is no distension.     Palpations: Abdomen is soft.     Tenderness: There is no abdominal tenderness.  Musculoskeletal:        General: No swelling or deformity.  Lymphadenopathy:     Cervical: No cervical adenopathy.  Skin:    General: Skin is warm and dry.     Findings: Bruising present. No rash.  Neurological:     General: No focal deficit present.     Motor:  No weakness.     Coordination: Coordination normal.  Psychiatric:        Mood and Affect: Mood normal.        Behavior: Behavior normal.      CBC    Component Value Date/Time   WBC 10.6 (H) 08/03/2016 1216   RBC 5.07 08/03/2016 1216   HGB 16.1 08/03/2016 1216   HCT 46.9 08/03/2016 1216   PLT 276.0 08/03/2016 1216   MCV 92.4 08/03/2016 1216   MCH 32.3 12/17/2010 1130   MCHC 34.3 08/03/2016 1216   RDW 15.0 08/03/2016 1216   LYMPHSABS 2.9 08/03/2016 1216   MONOABS 0.9 08/03/2016 1216   EOSABS 0.4 08/03/2016 1216   BASOSABS 0.1 08/03/2016 1216     Chest Imaging- films reviewed: CT chest 12/04/2018- no significant mediastinal adenopathy.  Patulous esophagus.  No significant emphysema, but bronchial wall thickening present.  Multiple pleural-based tags associated with pulmonary nodules, especially in the right lung.  Areas of focal subpleural intralobular septal thickening.  These areas appear stable compared to screening CT of 05/2017.  Pulmonary Functions Testing Results: PFT Results Latest Ref Rng & Units 10/25/2013  FVC-Pre L 4.07  FVC-Predicted Pre % 92  FVC-Post L 4.69  FVC-Predicted Post % 106  Pre FEV1/FVC % % 68  Post FEV1/FCV % % 64  FEV1-Pre L 2.75  FEV1-Predicted Pre % 82  FEV1-Post L 3.00  DLCO UNC% % 92  DLCO COR %Predicted % 84  TLC L 7.37  TLC % Predicted % 111  RV % Predicted % 134   Mild obstruction with significant bronchodilator reversibility.  Flow volume loop consistent with obstruction.     Assessment & Plan:     ICD-10-CM   1. Personal history of tobacco use, presenting hazards to health  Z87.891 varenicline (CHANTIX) 0.5 MG tablet    varenicline (CHANTIX CONTINUING MONTH PAK) 1 MG tablet  2. Centrilobular emphysema (HCC)  J43.2 umeclidinium-vilanterol (ANORO ELLIPTA) 62.5-25 MCG/INH AEPB    albuterol (VENTOLIN HFA) 108 (90 Base) MCG/ACT inhaler  3. Chronic obstructive pulmonary disease, unspecified COPD type (HCC)  J44.9 umeclidinium-vilanterol  (ANORO ELLIPTA) 62.5-25 MCG/INH AEPB    albuterol (VENTOLIN HFA) 108 (90 Base) MCG/ACT inhaler  4. Pulmonary nodules  R91.8   5. Need for immunization against influenza  Z23 Flu Vaccine QUAD High Dose(Fluad)   COPD- GOLD A.  Doing well on maintenance LAMA/ LABA.  Unfortunately continues to smoke. -Flu shot today -Continue Anoro daily; refills provided. -Continue albuterol as needed; refills provided. - Discussed the importance of continued social distancing, mask wearing, handwashing, and avoiding sick contacts.  Pulmonary nodules- stable - Follow-up CT after 1 year (previous ordered)  Tobacco abuse- in the contemplative stage of change. -Counseled on the importance of quitting smoking, he does not feel that he is at a place where he would be successful due to situational stressors at home.  He understands that even cutting back could be an important  step. -Invited him to let us know how we can help support his quitting efforts.  I have provided him with Chantix prescriptions that he can take to be filled whenever he is ready.  If he has any questions closer to that time he will call us. -Encouraged him to plan ahead for ways to change habits and behaviors that have centered around smoking over the years.  RTC in 1 year or sooner if needed.     Current Outpatient Medications:  .  albuterol (VENTOLIN HFA) 108 (90 Base) MCG/ACT inhaler, Inhale 2 puffs into the lungs every 6 (six) hours as needed for wheezing or shortness of breath., Disp: 6.7 g, Rfl: 6 .  aspirin 81 MG tablet, Take 81 mg by mouth daily.  , Disp: , Rfl:  .  fenofibrate (TRICOR) 145 MG tablet, Take 145 mg by mouth at bedtime. , Disp: , Rfl:  .  fish oil-omega-3 fatty acids 1000 MG capsule, Take 1 g by mouth 2 (two) times daily. , Disp: , Rfl:  .  glucosamine-chondroitin 500-400 MG tablet, Take 1 tablet by mouth 2 (two) times daily., Disp: , Rfl:  .  HYDROcodone-acetaminophen (NORCO) 10-325 MG per tablet, Take 1 tablet by  mouth 3 (three) times daily as needed for moderate pain. , Disp: , Rfl:  .  losartan (COZAAR) 100 MG tablet, Take 1 tablet (100 mg total) by mouth daily., Disp: 90 tablet, Rfl: 2 .  Multiple Vitamin (MULTIVITAMIN) capsule, Take 1 capsule by mouth daily.  , Disp: , Rfl:  .  nitroGLYCERIN (NITROSTAT) 0.4 MG SL tablet, Place 1 tablet (0.4 mg total) under the tongue every 5 (five) minutes as needed for chest pain., Disp: 25 tablet, Rfl: 3 .  omeprazole (PRILOSEC) 20 MG capsule, Take 20 mg by mouth daily. , Disp: , Rfl: 2 .  simvastatin (ZOCOR) 20 MG tablet, Take 1 tablet by mouth once daily, Disp: 90 tablet, Rfl: 0 .  umeclidinium-vilanterol (ANORO ELLIPTA) 62.5-25 MCG/INH AEPB, Inhale 1 puff into the lungs daily., Disp: 1 each, Rfl: 11 .  varenicline (CHANTIX CONTINUING MONTH PAK) 1 MG tablet, Take 1 tablet (1 mg total) by mouth 2 (two) times daily., Disp: 60 tablet, Rfl: 2 .  varenicline (CHANTIX) 0.5 MG tablet, Take 1 tablet (0.5 mg total) by mouth 2 (two) times daily., Disp: 60 tablet, Rfl: 0   Julian Hy, DO [facility] 02/20/2019 4:56 PM

## 2019-02-20 NOTE — Patient Instructions (Addendum)
Thank you for visiting Dr. Carlis Abbott at [facility]. We recommend the following: No orders of the defined types were placed in this encounter.  No orders of the defined types were placed in this encounter.   Meds ordered this encounter  Medications  . varenicline (CHANTIX) 0.5 MG tablet    Sig: Take 1 tablet (0.5 mg total) by mouth 2 (two) times daily.    Dispense:  60 tablet    Refill:  0  . varenicline (CHANTIX CONTINUING MONTH PAK) 1 MG tablet    Sig: Take 1 tablet (1 mg total) by mouth 2 (two) times daily.    Dispense:  60 tablet    Refill:  2  . umeclidinium-vilanterol (ANORO ELLIPTA) 62.5-25 MCG/INH AEPB    Sig: Inhale 1 puff into the lungs daily.    Dispense:  1 each    Refill:  11    Order Specific Question:   Lot Number?    Answer:   [id number]    Order Specific Question:   Expiration Date?    Answer:   05/24/2019    Order Specific Question:   Manufacturer?    Answer:   [employer] [[id number]]    Order Specific Question:   Quantity    Answer:   2  . albuterol (VENTOLIN HFA) 108 (90 Base) MCG/ACT inhaler    Sig: Inhale 2 puffs into the lungs every 6 (six) hours as needed for wheezing or shortness of breath.    Dispense:  6.7 g    Refill:  6    Return in about 1 year (around 02/20/2020).    Please do your part to reduce the spread of COVID-19.

## 2019-03-08 DIAGNOSIS — J449 Chronic obstructive pulmonary disease, unspecified: Secondary | ICD-10-CM | POA: Diagnosis not present

## 2019-03-08 DIAGNOSIS — I10 Essential (primary) hypertension: Secondary | ICD-10-CM | POA: Diagnosis not present

## 2019-03-08 DIAGNOSIS — Z6823 Body mass index (BMI) 23.0-23.9, adult: Secondary | ICD-10-CM | POA: Diagnosis not present

## 2019-03-08 DIAGNOSIS — G894 Chronic pain syndrome: Secondary | ICD-10-CM | POA: Diagnosis not present

## 2019-03-14 ENCOUNTER — Other Ambulatory Visit: Payer: Self-pay | Admitting: Cardiology

## 2019-03-14 ENCOUNTER — Other Ambulatory Visit: Payer: Self-pay | Admitting: Pulmonary Disease

## 2019-03-14 DIAGNOSIS — J449 Chronic obstructive pulmonary disease, unspecified: Secondary | ICD-10-CM

## 2019-03-14 DIAGNOSIS — J432 Centrilobular emphysema: Secondary | ICD-10-CM

## 2019-04-08 DIAGNOSIS — I10 Essential (primary) hypertension: Secondary | ICD-10-CM | POA: Diagnosis not present

## 2019-04-08 DIAGNOSIS — J329 Chronic sinusitis, unspecified: Secondary | ICD-10-CM | POA: Diagnosis not present

## 2019-04-08 DIAGNOSIS — G894 Chronic pain syndrome: Secondary | ICD-10-CM | POA: Diagnosis not present

## 2019-04-08 DIAGNOSIS — Z6823 Body mass index (BMI) 23.0-23.9, adult: Secondary | ICD-10-CM | POA: Diagnosis not present

## 2019-05-01 ENCOUNTER — Other Ambulatory Visit: Payer: Self-pay | Admitting: Cardiology

## 2019-05-06 DIAGNOSIS — Z6831 Body mass index (BMI) 31.0-31.9, adult: Secondary | ICD-10-CM | POA: Diagnosis not present

## 2019-05-06 DIAGNOSIS — E6609 Other obesity due to excess calories: Secondary | ICD-10-CM | POA: Diagnosis not present

## 2019-05-06 DIAGNOSIS — J449 Chronic obstructive pulmonary disease, unspecified: Secondary | ICD-10-CM | POA: Diagnosis not present

## 2019-05-06 DIAGNOSIS — G894 Chronic pain syndrome: Secondary | ICD-10-CM | POA: Diagnosis not present

## 2019-06-10 DIAGNOSIS — J439 Emphysema, unspecified: Secondary | ICD-10-CM | POA: Diagnosis not present

## 2019-06-10 DIAGNOSIS — G894 Chronic pain syndrome: Secondary | ICD-10-CM | POA: Diagnosis not present

## 2019-06-10 DIAGNOSIS — Z6831 Body mass index (BMI) 31.0-31.9, adult: Secondary | ICD-10-CM | POA: Diagnosis not present

## 2019-06-10 DIAGNOSIS — J449 Chronic obstructive pulmonary disease, unspecified: Secondary | ICD-10-CM | POA: Diagnosis not present

## 2019-06-11 ENCOUNTER — Other Ambulatory Visit: Payer: Self-pay | Admitting: Cardiology

## 2019-07-18 DIAGNOSIS — G894 Chronic pain syndrome: Secondary | ICD-10-CM | POA: Diagnosis not present

## 2019-07-18 DIAGNOSIS — E6609 Other obesity due to excess calories: Secondary | ICD-10-CM | POA: Diagnosis not present

## 2019-07-18 DIAGNOSIS — Z6831 Body mass index (BMI) 31.0-31.9, adult: Secondary | ICD-10-CM | POA: Diagnosis not present

## 2019-08-02 DIAGNOSIS — H6691 Otitis media, unspecified, right ear: Secondary | ICD-10-CM | POA: Diagnosis not present

## 2019-08-02 DIAGNOSIS — G8929 Other chronic pain: Secondary | ICD-10-CM | POA: Diagnosis not present

## 2019-08-02 DIAGNOSIS — E6609 Other obesity due to excess calories: Secondary | ICD-10-CM | POA: Diagnosis not present

## 2019-08-02 DIAGNOSIS — Z6831 Body mass index (BMI) 31.0-31.9, adult: Secondary | ICD-10-CM | POA: Diagnosis not present

## 2019-08-15 DIAGNOSIS — K219 Gastro-esophageal reflux disease without esophagitis: Secondary | ICD-10-CM | POA: Diagnosis not present

## 2019-08-15 DIAGNOSIS — I10 Essential (primary) hypertension: Secondary | ICD-10-CM | POA: Diagnosis not present

## 2019-08-15 DIAGNOSIS — Z6832 Body mass index (BMI) 32.0-32.9, adult: Secondary | ICD-10-CM | POA: Diagnosis not present

## 2019-08-15 DIAGNOSIS — G894 Chronic pain syndrome: Secondary | ICD-10-CM | POA: Diagnosis not present

## 2019-09-13 DIAGNOSIS — G894 Chronic pain syndrome: Secondary | ICD-10-CM | POA: Diagnosis not present

## 2019-09-13 DIAGNOSIS — J449 Chronic obstructive pulmonary disease, unspecified: Secondary | ICD-10-CM | POA: Diagnosis not present

## 2019-09-13 DIAGNOSIS — Z6831 Body mass index (BMI) 31.0-31.9, adult: Secondary | ICD-10-CM | POA: Diagnosis not present

## 2019-09-13 DIAGNOSIS — E6609 Other obesity due to excess calories: Secondary | ICD-10-CM | POA: Diagnosis not present

## 2019-09-20 DIAGNOSIS — I2510 Atherosclerotic heart disease of native coronary artery without angina pectoris: Secondary | ICD-10-CM | POA: Diagnosis not present

## 2019-09-20 DIAGNOSIS — J449 Chronic obstructive pulmonary disease, unspecified: Secondary | ICD-10-CM | POA: Diagnosis not present

## 2019-09-20 DIAGNOSIS — I10 Essential (primary) hypertension: Secondary | ICD-10-CM | POA: Diagnosis not present

## 2019-09-20 DIAGNOSIS — Z720 Tobacco use: Secondary | ICD-10-CM | POA: Diagnosis not present

## 2019-10-17 DIAGNOSIS — G894 Chronic pain syndrome: Secondary | ICD-10-CM | POA: Diagnosis not present

## 2019-10-17 DIAGNOSIS — N529 Male erectile dysfunction, unspecified: Secondary | ICD-10-CM | POA: Diagnosis not present

## 2019-10-17 DIAGNOSIS — Z6831 Body mass index (BMI) 31.0-31.9, adult: Secondary | ICD-10-CM | POA: Diagnosis not present

## 2019-10-17 DIAGNOSIS — K219 Gastro-esophageal reflux disease without esophagitis: Secondary | ICD-10-CM | POA: Diagnosis not present

## 2019-11-20 DIAGNOSIS — I2510 Atherosclerotic heart disease of native coronary artery without angina pectoris: Secondary | ICD-10-CM | POA: Diagnosis not present

## 2019-11-20 DIAGNOSIS — I10 Essential (primary) hypertension: Secondary | ICD-10-CM | POA: Diagnosis not present

## 2019-11-20 DIAGNOSIS — J449 Chronic obstructive pulmonary disease, unspecified: Secondary | ICD-10-CM | POA: Diagnosis not present

## 2019-11-20 DIAGNOSIS — Z720 Tobacco use: Secondary | ICD-10-CM | POA: Diagnosis not present

## 2019-11-21 DIAGNOSIS — J301 Allergic rhinitis due to pollen: Secondary | ICD-10-CM | POA: Diagnosis not present

## 2019-11-21 DIAGNOSIS — G894 Chronic pain syndrome: Secondary | ICD-10-CM | POA: Diagnosis not present

## 2019-11-21 DIAGNOSIS — Z6831 Body mass index (BMI) 31.0-31.9, adult: Secondary | ICD-10-CM | POA: Diagnosis not present

## 2019-11-21 DIAGNOSIS — E6609 Other obesity due to excess calories: Secondary | ICD-10-CM | POA: Diagnosis not present

## 2019-11-28 DIAGNOSIS — E6609 Other obesity due to excess calories: Secondary | ICD-10-CM | POA: Diagnosis not present

## 2019-11-28 DIAGNOSIS — Z6831 Body mass index (BMI) 31.0-31.9, adult: Secondary | ICD-10-CM | POA: Diagnosis not present

## 2019-11-28 DIAGNOSIS — J069 Acute upper respiratory infection, unspecified: Secondary | ICD-10-CM | POA: Diagnosis not present

## 2019-11-28 DIAGNOSIS — J449 Chronic obstructive pulmonary disease, unspecified: Secondary | ICD-10-CM | POA: Diagnosis not present

## 2019-12-19 DIAGNOSIS — Z1389 Encounter for screening for other disorder: Secondary | ICD-10-CM | POA: Diagnosis not present

## 2019-12-19 DIAGNOSIS — Z0000 Encounter for general adult medical examination without abnormal findings: Secondary | ICD-10-CM | POA: Diagnosis not present

## 2019-12-19 DIAGNOSIS — R1031 Right lower quadrant pain: Secondary | ICD-10-CM | POA: Diagnosis not present

## 2019-12-19 DIAGNOSIS — Z6831 Body mass index (BMI) 31.0-31.9, adult: Secondary | ICD-10-CM | POA: Diagnosis not present

## 2019-12-19 DIAGNOSIS — E6609 Other obesity due to excess calories: Secondary | ICD-10-CM | POA: Diagnosis not present

## 2019-12-19 DIAGNOSIS — G894 Chronic pain syndrome: Secondary | ICD-10-CM | POA: Diagnosis not present

## 2019-12-19 DIAGNOSIS — J449 Chronic obstructive pulmonary disease, unspecified: Secondary | ICD-10-CM | POA: Diagnosis not present

## 2019-12-20 DIAGNOSIS — Z720 Tobacco use: Secondary | ICD-10-CM | POA: Diagnosis not present

## 2019-12-20 DIAGNOSIS — I2510 Atherosclerotic heart disease of native coronary artery without angina pectoris: Secondary | ICD-10-CM | POA: Diagnosis not present

## 2019-12-20 DIAGNOSIS — J449 Chronic obstructive pulmonary disease, unspecified: Secondary | ICD-10-CM | POA: Diagnosis not present

## 2019-12-20 DIAGNOSIS — I10 Essential (primary) hypertension: Secondary | ICD-10-CM | POA: Diagnosis not present

## 2019-12-25 ENCOUNTER — Other Ambulatory Visit: Payer: Self-pay

## 2019-12-25 ENCOUNTER — Ambulatory Visit (HOSPITAL_COMMUNITY)
Admission: RE | Admit: 2019-12-25 | Discharge: 2019-12-25 | Disposition: A | Discharge status: Home / Self Care | Payer: Medicare Other | Source: Ambulatory Visit | Attending: Acute Care | Admitting: Acute Care

## 2019-12-25 DIAGNOSIS — Z87891 Personal history of nicotine dependence: Secondary | ICD-10-CM | POA: Diagnosis not present

## 2019-12-25 DIAGNOSIS — F17210 Nicotine dependence, cigarettes, uncomplicated: Secondary | ICD-10-CM

## 2019-12-25 DIAGNOSIS — Z122 Encounter for screening for malignant neoplasm of respiratory organs: Secondary | ICD-10-CM

## 2019-12-25 DIAGNOSIS — F17200 Nicotine dependence, unspecified, uncomplicated: Secondary | ICD-10-CM | POA: Insufficient documentation

## 2019-12-28 ENCOUNTER — Other Ambulatory Visit (HOSPITAL_COMMUNITY): Payer: Self-pay | Admitting: Internal Medicine

## 2019-12-28 ENCOUNTER — Other Ambulatory Visit: Payer: Self-pay | Admitting: Internal Medicine

## 2019-12-28 DIAGNOSIS — R109 Unspecified abdominal pain: Secondary | ICD-10-CM

## 2019-12-30 NOTE — Progress Notes (Signed)
Please call patient and let them  know their  low dose Ct was read as a Lung RADS 1, negative study: no nodules or definitely benign nodules. Radiology recommendation is for a repeat LDCT in 12 months.  Please let them  know we will order and schedule their  annual screening scan for 12/2020. Please let them  know there was notation of CAD on their  scan.  Please remind the patient  that this is a non-gated exam therefore degree or severity of disease  cannot be determined. Please have them  follow up with their PCP regarding potential risk factor modification, dietary therapy or pharmacologic therapy if clinically indicated. Pt.  is  currently on statin therapy. Please place order for annual  screening scan for  12/2020 and fax results to PCP. Thanks so much.  Langley Gauss, this patient is followed by cardiology.

## 2019-12-31 ENCOUNTER — Other Ambulatory Visit: Payer: Self-pay | Admitting: *Deleted

## 2019-12-31 DIAGNOSIS — F17210 Nicotine dependence, cigarettes, uncomplicated: Secondary | ICD-10-CM

## 2020-01-06 ENCOUNTER — Encounter: Payer: Self-pay | Admitting: Nurse Practitioner

## 2020-01-20 ENCOUNTER — Ambulatory Visit (HOSPITAL_COMMUNITY): Admission: RE | Admit: 2020-01-20 | Payer: Medicare Other | Source: Ambulatory Visit

## 2020-01-20 ENCOUNTER — Encounter (HOSPITAL_COMMUNITY): Payer: Self-pay

## 2020-01-21 ENCOUNTER — Encounter: Payer: Self-pay | Admitting: Internal Medicine

## 2020-01-21 DIAGNOSIS — I10 Essential (primary) hypertension: Secondary | ICD-10-CM | POA: Diagnosis not present

## 2020-01-21 DIAGNOSIS — I2510 Atherosclerotic heart disease of native coronary artery without angina pectoris: Secondary | ICD-10-CM | POA: Diagnosis not present

## 2020-01-21 DIAGNOSIS — Z720 Tobacco use: Secondary | ICD-10-CM | POA: Diagnosis not present

## 2020-01-21 DIAGNOSIS — J449 Chronic obstructive pulmonary disease, unspecified: Secondary | ICD-10-CM | POA: Diagnosis not present

## 2020-01-30 ENCOUNTER — Other Ambulatory Visit (HOSPITAL_COMMUNITY): Payer: Self-pay | Admitting: Internal Medicine

## 2020-01-30 DIAGNOSIS — M353 Polymyalgia rheumatica: Secondary | ICD-10-CM | POA: Diagnosis not present

## 2020-01-30 DIAGNOSIS — R109 Unspecified abdominal pain: Secondary | ICD-10-CM | POA: Diagnosis not present

## 2020-01-30 DIAGNOSIS — Z6831 Body mass index (BMI) 31.0-31.9, adult: Secondary | ICD-10-CM | POA: Diagnosis not present

## 2020-01-30 DIAGNOSIS — G894 Chronic pain syndrome: Secondary | ICD-10-CM | POA: Diagnosis not present

## 2020-01-31 DIAGNOSIS — Z79891 Long term (current) use of opiate analgesic: Secondary | ICD-10-CM | POA: Diagnosis not present

## 2020-02-21 ENCOUNTER — Ambulatory Visit (HOSPITAL_COMMUNITY)
Admission: RE | Admit: 2020-02-21 | Discharge: 2020-02-21 | Disposition: A | Discharge status: Home / Self Care | Payer: Medicare Other | Source: Ambulatory Visit | Attending: Internal Medicine | Admitting: Internal Medicine

## 2020-02-21 DIAGNOSIS — R109 Unspecified abdominal pain: Secondary | ICD-10-CM | POA: Diagnosis not present

## 2020-02-21 LAB — POCT I-STAT CREATININE: Creatinine, Ser: 1.3 mg/dL — ABNORMAL HIGH (ref 0.61–1.24)

## 2020-02-21 MED ORDER — IOHEXOL 300 MG/ML  SOLN
100.0000 mL | Freq: Once | INTRAMUSCULAR | Status: AC | PRN
  Administered 2020-02-21: 100 mL via INTRAVENOUS

## 2020-02-28 DIAGNOSIS — Z6831 Body mass index (BMI) 31.0-31.9, adult: Secondary | ICD-10-CM | POA: Diagnosis not present

## 2020-02-28 DIAGNOSIS — R109 Unspecified abdominal pain: Secondary | ICD-10-CM | POA: Diagnosis not present

## 2020-02-28 DIAGNOSIS — G894 Chronic pain syndrome: Secondary | ICD-10-CM | POA: Diagnosis not present

## 2020-02-28 DIAGNOSIS — K219 Gastro-esophageal reflux disease without esophagitis: Secondary | ICD-10-CM | POA: Diagnosis not present

## 2020-03-02 ENCOUNTER — Encounter: Payer: Self-pay | Admitting: Nurse Practitioner

## 2020-03-09 ENCOUNTER — Ambulatory Visit: Payer: Medicare Other | Admitting: Nurse Practitioner

## 2020-03-21 DIAGNOSIS — E7849 Other hyperlipidemia: Secondary | ICD-10-CM | POA: Diagnosis not present

## 2020-03-21 DIAGNOSIS — K219 Gastro-esophageal reflux disease without esophagitis: Secondary | ICD-10-CM | POA: Diagnosis not present

## 2020-03-24 ENCOUNTER — Other Ambulatory Visit: Payer: Self-pay | Admitting: Nurse Practitioner

## 2020-03-24 ENCOUNTER — Ambulatory Visit: Payer: Medicare Other | Admitting: Nurse Practitioner

## 2020-03-24 ENCOUNTER — Encounter: Payer: Self-pay | Admitting: Nurse Practitioner

## 2020-03-24 ENCOUNTER — Telehealth: Payer: Self-pay | Admitting: *Deleted

## 2020-03-24 ENCOUNTER — Other Ambulatory Visit: Payer: Self-pay

## 2020-03-24 VITALS — BP 141/92 | HR 65 | Temp 97.3°F | Ht 67.0 in | Wt 199.0 lb

## 2020-03-24 DIAGNOSIS — R109 Unspecified abdominal pain: Secondary | ICD-10-CM | POA: Diagnosis not present

## 2020-03-24 DIAGNOSIS — K219 Gastro-esophageal reflux disease without esophagitis: Secondary | ICD-10-CM

## 2020-03-24 DIAGNOSIS — Z86010 Personal history of colonic polyps: Secondary | ICD-10-CM | POA: Diagnosis not present

## 2020-03-24 NOTE — Telephone Encounter (Signed)
[place] scheduled for 11/9 at 8:30am, arrival 8:15am, npo midnight Patient also needs TCS w/ propofol, Dr. Gala Romney, ASA 2 ---  Called pt, he did not have good reception. WCB

## 2020-03-24 NOTE — Patient Instructions (Signed)
Your health issues we discussed today were:   GERD (heartburn/reflux): 1. Glad your medication is working well for you 2. As we discussed, you are on a very low-dose of acid blocker 3. I recommend he continue taking this as you tend to have pretty bad symptoms when you stop 4. Call for any worsening or severe symptoms  History of polyps: 1. You are due for your updated colonoscopy 2. We will schedule your colonoscopy for you 3. This will also help shed light on your abdominal pain give [place] more information to help 4. Further recommendations will follow your colonoscopy  Right-sided abdominal pain: 1. I have put in basic labs.  Please have these done when you are able to 2. I have also put in to schedule an ultrasound of your right side to check for any abnormalities 3. Call if you have any worsening or severe symptoms 4. As we discussed, I feel overall this is musculoskeletal in nature.  However we will make sure that there is nothing concerning going on  Overall I recommend:  1. Continue your other current medications 2. Return for follow-up in 2 months 3. Call us for any questions or concerns   ---------------------------------------------------------------  I am glad you have gotten your COVID-19 vaccination!  Make sure you keep your appointment for your second dose.  When you are fully vaccinated you should continue to follow CDC and state/local guidelines.  ---------------------------------------------------------------   At [facility] we value your feedback. You may receive a survey about your visit today. Please share your experience as we strive to create trusting relationships with our patients to provide genuine, compassionate, quality care.  We appreciate your understanding and patience as we review any laboratory studies, imaging, and other diagnostic tests that are ordered as we care for you. Our office policy is 5 business days for review of these  results, and any emergent or urgent results are addressed in a timely manner for your best interest. If you do not hear from our office in 1 week, please contact us.   We also encourage the use of MyChart, which contains your medical information for your review as well. If you are not enrolled in this feature, an access code is on this after visit summary for your convenience. Thank you for allowing [place] to be involved in your care.  It was great to see you today!  I hope you have a great Fall and happy Thanksgiving!!

## 2020-03-24 NOTE — H&P (View-Only) (Signed)
Primary Care Physician:  Redmond School, MD Primary Gastroenterologist:  Dr. Gala Romney  Chief Complaint  Patient presents with  . Abdominal Pain    x 1 year R side comes and goes    HPI:   [name] is a 69 y.o. male who presents on referral from primary care to schedule colonoscopy.  Nurse/phone triage was deferred office visit due to increased sedation needs at previous colonoscopy.  The patient has not been seen in our office since 11/03/2015 which was for GERD and abdominal pain.  At that time GERD well managed on PPI but having recurrent right mid abdominal pain felt to be musculoskeletal in nature.  History of small umbilical hernia on previous CT, although this was not appreciated on physical exam.  Recommended labs, UA, requested CT images for radiology review.  Labs were completed which found normal CBC, elevated creatinine 1.43, normal UA and lipase.  CT images reviewed with radiologist that showed diffusely thickened bladder wall with no obvious tumor.  Noted elevated creatinine and suspect pain is from urinary bladder and recommended referral to urology.  Previous colonoscopy completed 01/16/2015 which found normal colonoscopy with hemorrhoids likely source of hematochezia.  Recommended 5-year repeat exam and consideration of hemorrhoid banding.  Requirements for Versed 6 mg, Demerol 125 mg, Phenergan 12.5 mg.  Today he states he doing okay overall.  When he arrived here for visit he was referred for abdominal pain as well.  Reviewed secondary referral sent after request for colonoscopy which indicated seen by PCP in September for abdominal pain with CT findings no acute findings.  Recommended GI follow-up.  He notes pain began about 1 year prior. Pain is mid-right side; different than last office visit. Described as sharp, occurs intermittently. Last time it was bad was last week after doing work around the house (building a Dentist.) Has known back problems. No known  triggers. Sometimes he'll lay down to help, Tylenol/Ibuprofen do not help. He has a bowel movement about every day, soft and passes easily. Denies GERD symptoms, takes Prilosec daily which manages his symptoms well. Denies N/V, hematochezia, melena, fever, chills, unintentional weight loss. Denies URI or flu-like symptoms. Denies loss of sense of taste or smell. The patient has received COVID-19 vaccination(s). They are scheduled for second dose upcoming. Denies chest pain, dyspnea, dizziness, lightheadedness, syncope, near syncope. Denies any other upper or lower GI symptoms.  Past Medical History:  Diagnosis Date  . Allergic rhinitis   . Anxiety and depression   . Asthma   . CAD (coronary artery disease)    Mild to moderate nonobstructive LAD disease 2006  . Chronic low back pain   . COPD (chronic obstructive pulmonary disease) ([place])   . Dyslipidemia   . Essential hypertension   . GERD (gastroesophageal reflux disease)   . Hearing loss   . Hemorrhoids   . Hyperplastic colon polyp 12/28/10  . IBS (irritable bowel syndrome)   . 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 Mitral regurgitation   . OSA on CPAP   . Tubular adenoma 12/28/10    Past Surgical History:  Procedure Laterality Date  . CHOLECYSTECTOMY    . COLONOSCOPY  2007   friable anal canal, hyperplastic polyp  . COLONOSCOPY  12/28/2010   Dr. Gala Romney- tubular adenoma, hyperplastic polyp  . COLONOSCOPY N/A 01/16/2015   Rourk: anal canal and internal hemorrhois. next surveillance colonoscopy 12/2019  . EAR PINNA RECONSTRUCTION W/ RIB GRAFT     right  .  ESOPHAGOGASTRODUODENOSCOPY     multiple dilations, last EGD 2007 showed small hh, adenomatous appearing gastric mucosa in body but biopsies benign. SB bx negative for Celiac.  Marland Kitchen ESOPHAGOGASTRODUODENOSCOPY  12/28/2010   Dr. Gala Romney- normal esophagus s/p dilationpatchy erythema and erosions.  Marland Kitchen FOOT SURGERY     right  . INGUINAL HERNIA REPAIR     left  . INGUINAL HERNIA REPAIR Left  07/20/2018   Procedure: LEFT INDIRECT INGUINAL AND FEMORAL HERNIA REPAIR WITH MESH;  Surgeon: Virl Cagey, MD;  Location: [facility];  Service: General;  Laterality: Left;  . LUNG BIOPSY  2009   right  . MALONEY DILATION  12/28/2010   Procedure: Venia Minks DILATION;  Surgeon: Daneil Dolin, MD;  Location: [facility]ITE;  Service: Endoscopy;  Laterality: N/A;  . TONSILLECTOMY    . TRANSTHORACIC ECHOCARDIOGRAM  02/2011   EF =>55%, normal chamber size & function; mild mitral and aortic insuff, mild-mod tricuspid insuff; mild pulm htn with rsvp of 48mHg    Current Outpatient Medications  Medication Sig Dispense Refill  . albuterol (VENTOLIN HFA) 108 (90 Base) MCG/ACT inhaler Inhale 2 puffs into the lungs every 6 (six) hours as needed for wheezing or shortness of breath. 6.7 g 6  . ANORO ELLIPTA 62.5-25 MCG/INH AEPB Inhale 1 puff by mouth once daily 60 each 0  . aspirin 81 MG tablet Take 81 mg by mouth daily.      . fenofibrate (TRICOR) 145 MG tablet Take 145 mg by mouth at bedtime.     . fish oil-omega-3 fatty acids 1000 MG capsule Take 1 g by mouth 2 (two) times daily.     .Marland KitchenHYDROcodone-acetaminophen (NORCO) 10-325 MG per tablet Take 1 tablet by mouth 3 (three) times daily as needed for moderate pain.     .Marland Kitchenlosartan (COZAAR) 100 MG tablet Take 1 tablet by mouth once daily 90 tablet 1  . Multiple Vitamin (MULTIVITAMIN) capsule Take 1 capsule by mouth daily.      . nitroGLYCERIN (NITROSTAT) 0.4 MG SL tablet Place 1 tablet (0.4 mg total) under the tongue every 5 (five) minutes as needed for chest pain. 25 tablet 3  . omeprazole (PRILOSEC) 20 MG capsule Take 20 mg by mouth daily.   2  . simvastatin (ZOCOR) 20 MG tablet Take 1 tablet by mouth once daily 90 tablet 3   No current facility-administered medications for this visit.    Allergies as of 03/24/2020 - Review Complete 03/24/2020  Allergen Reaction Noted  . Bee venom Swelling 12/28/2010    Family History  Problem Relation Age of Onset    . Heart disease Father 470      etoh/breathing problmes  . COPD Father   . Heart attack Maternal Grandfather   . Diabetes Paternal Grandmother   . COPD Paternal Grandmother   . Heart Problems Paternal Grandfather   . GI problems Mother   . Hyperlipidemia Mother   . COPD Mother   . Heart attack Maternal Grandmother        also valvular problems  . Heart attack Brother        stenting x3, also cancer  . Skin cancer Brother   . Hypertension Brother   . Hypertension Sister   . Colon cancer Neg Hx   . Liver disease Neg Hx   . Inflammatory bowel disease Neg Hx     Social History   Socioeconomic History  . Marital status: Married    Spouse name: Not on file  .  Number of children: 1  . Years of education: Not on file  . Highest education level: Not on file  Occupational History  . Occupation: disability    Employer: UNEMPLOYED  Tobacco Use  . Smoking status: Current Every Day Smoker    Packs/day: 0.50    Years: 37.00    Pack years: 18.50    Types: Cigarettes    Start date: 05/23/1978  . Smokeless tobacco: Never Used  . Tobacco comment: down to 0.25ppd  Vaping Use  . Vaping Use: Some days  Substance and Sexual Activity  . Alcohol use: No    Alcohol/week: 0.0 standard drinks  . Drug use: No  . Sexual activity: Not on file  Other Topics Concern  . Not on file  Social History Narrative   Son, age 32, hit by truck.   Drinks about 2-3 cups of coffee a day. Occasionally drinks tea.    Social Determinants of Health   Financial Resource Strain:   . Difficulty of Paying Living Expenses: Not on file  Food Insecurity:   . Worried About Charity fundraiser in the Last Year: Not on file  . Ran Out of Food in the Last Year: Not on file  Transportation Needs:   . Lack of Transportation (Medical): Not on file  . Lack of Transportation (Non-Medical): Not on file  Physical Activity:   . Days of Exercise per Week: Not on file  . Minutes of Exercise per Session: Not on file   Stress:   . Feeling of Stress : Not on file  Social Connections:   . Frequency of Communication with Friends and Family: Not on file  . Frequency of Social Gatherings with Friends and Family: Not on file  . Attends Religious Services: Not on file  . Active Member of Clubs or Organizations: Not on file  . Attends Archivist Meetings: Not on file  . Marital Status: Not on file  Intimate Partner Violence:   . Fear of Current or Ex-Partner: Not on file  . Emotionally Abused: Not on file  . Physically Abused: Not on file  . Sexually Abused: Not on file    Subjective: Review of Systems  Constitutional: Negative for chills, fever, malaise/fatigue and weight loss.  HENT: Negative for congestion and sore throat.   Respiratory: Negative for cough and shortness of breath.   Cardiovascular: Negative for chest pain and palpitations.  Gastrointestinal: Positive for abdominal pain. Negative for blood in stool, constipation, diarrhea, heartburn, melena, nausea and vomiting.  Musculoskeletal: Negative for joint pain and myalgias.  Skin: Negative for rash.  Neurological: Negative for dizziness and weakness.  Endo/Heme/Allergies: Does not bruise/bleed easily.  Psychiatric/Behavioral: Negative for depression. The patient is not nervous/anxious.   All other systems reviewed and are negative.      Objective: BP (!) 141/92   Pulse 65   Temp (!) 97.3 F (36.3 C) (Temporal)   Ht _0  (1.702 m)   Wt 199 lb (90.3 kg)   BMI 31.17 kg/m  Physical Exam Vitals and nursing note reviewed.  Constitutional:      General: He is not in acute distress.    Appearance: Normal appearance. He is obese. He is not ill-appearing, toxic-appearing or diaphoretic.  HENT:     Head: Normocephalic and atraumatic.     Nose: No congestion or rhinorrhea.  Eyes:     General: No scleral icterus. Cardiovascular:     Rate and Rhythm: Normal rate and regular rhythm.  Heart sounds: Normal heart sounds.   Pulmonary:     Effort: Pulmonary effort is normal.     Breath sounds: Normal breath sounds.  Abdominal:     General: Bowel sounds are normal. There is no distension.     Palpations: Abdomen is soft. There is no hepatomegaly, splenomegaly or mass.     Tenderness: There is no abdominal tenderness. There is no guarding or rebound.     Hernia: No hernia is present.    Musculoskeletal:     Cervical back: Neck supple.  Skin:    General: Skin is warm and dry.     Coloration: Skin is not jaundiced.     Findings: No bruising or rash.  Neurological:     General: No focal deficit present.     Mental Status: He is alert and oriented to person, place, and time. Mental status is at baseline.  Psychiatric:        Mood and Affect: Mood normal.        Behavior: Behavior normal.        Thought Content: Thought content normal.      Assessment:  Pleasant 69 year old male presents on referral from primary care, initially to schedule colonoscopy but he is due for.  However there is a subsequent referral received a few weeks ago noting right-sided abdominal pain.  History of colon polyps: History of colon polyps as noted above in 2016 with a recommended 5-year repeat exam.  At this point we will schedule his colonoscopy.  No red flag/warning signs or symptoms.  GERD: He is doing well on low-dose Prilosec 20 mg once daily.  If he stops taking his medication he does have significant flares of his GERD symptoms.  His son expressed some concern to the patient about taking PPI ongoing.  We had a discussion about the risks versus benefits and have agreed to continue with Protonix 20 mg daily.  Call for worsening symptoms  Right-sided abdominal pain: As noted above, patient states this feels different than it did when he was last seen in our office around 2016 or 2017.  On description it seems to be more musculoskeletal in nature.  He does have a history of neck and musculoskeletal issues.  On exam the area of  tenderness seems to be a bit thickened or hypertrophied.  CT of the abdomen and pelvis was completed and found no acute findings.  Specifically, no mention of musculoskeletal findings.  Denies constipation.  GERD well managed.  At this point I will check a spot ultrasound of the area of hypertrophy to relate any concerns.  Overall, that this does feel like a musculoskeletal etiology.  However, he is due for colonoscopy and this will help confirm no significant pathology.  I will also check basic labs including CBC, CMP, lipase.  Proceed with colonoscopy by Dr. Gala Romney on propofol/MAC in near future: the risks, benefits, and alternatives have been discussed with the patient in detail. The patient states understanding and desires to proceed.  The patient is currently on hydrocodone. The patient is not on any other anticoagulants, anxiolytics, chronic pain medications, antidepressants, antidiabetics, or iron supplements.  We will plan for the procedure on propofol/MAC to promote adequate sedation based on increase sedation needed previous exam.  ASA II   Plan: 1. Right-sided abdominal ultrasound 2. CBC, CMP, lipase 3. Call for worsening symptoms 4. Follow-up in 2 months    Thank you for allowing [place] to participate in the care of Earlie Lou  Walden Field, DNP, AGNP-C Adult & Gerontological Nurse Practitioner [facility]   03/24/2020 9:26 AM   Disclaimer: This note was dictated with voice recognition software. Similar sounding words can inadvertently be transcribed and may not be corrected upon review.

## 2020-03-24 NOTE — Progress Notes (Signed)
Primary Care Physician:  Redmond School, MD Primary Gastroenterologist:  Dr. Gala Romney  Chief Complaint  Patient presents with  . Abdominal Pain    x 1 year R side comes and goes    HPI:   [name] is a 69 y.o. male who presents on referral from primary care to schedule colonoscopy.  Nurse/phone triage was deferred office visit due to increased sedation needs at previous colonoscopy.  The patient has not been seen in our office since 11/03/2015 which was for GERD and abdominal pain.  At that time GERD well managed on PPI but having recurrent right mid abdominal pain felt to be musculoskeletal in nature.  History of small umbilical hernia on previous CT, although this was not appreciated on physical exam.  Recommended labs, UA, requested CT images for radiology review.  Labs were completed which found normal CBC, elevated creatinine 1.43, normal UA and lipase.  CT images reviewed with radiologist that showed diffusely thickened bladder wall with no obvious tumor.  Noted elevated creatinine and suspect pain is from urinary bladder and recommended referral to urology.  Previous colonoscopy completed 01/16/2015 which found normal colonoscopy with hemorrhoids likely source of hematochezia.  Recommended 5-year repeat exam and consideration of hemorrhoid banding.  Requirements for Versed 6 mg, Demerol 125 mg, Phenergan 12.5 mg.  Today he states he doing okay overall.  When he arrived here for visit he was referred for abdominal pain as well.  Reviewed secondary referral sent after request for colonoscopy which indicated seen by PCP in September for abdominal pain with CT findings no acute findings.  Recommended GI follow-up.  He notes pain began about 1 year prior. Pain is mid-right side; different than last office visit. Described as sharp, occurs intermittently. Last time it was bad was last week after doing work around the house (building a Dentist.) Has known back problems. No known  triggers. Sometimes he'll lay down to help, Tylenol/Ibuprofen do not help. He has a bowel movement about every day, soft and passes easily. Denies GERD symptoms, takes Prilosec daily which manages his symptoms well. Denies N/V, hematochezia, melena, fever, chills, unintentional weight loss. Denies URI or flu-like symptoms. Denies loss of sense of taste or smell. The patient has received COVID-19 vaccination(s). They are scheduled for second dose upcoming. Denies chest pain, dyspnea, dizziness, lightheadedness, syncope, near syncope. Denies any other upper or lower GI symptoms.  Past Medical History:  Diagnosis Date  . Allergic rhinitis   . Anxiety and depression   . Asthma   . CAD (coronary artery disease)    Mild to moderate nonobstructive LAD disease 2006  . Chronic low back pain   . COPD (chronic obstructive pulmonary disease) ([place])   . Dyslipidemia   . Essential hypertension   . GERD (gastroesophageal reflux disease)   . Hearing loss   . Hemorrhoids   . Hyperplastic colon polyp 12/28/10  . IBS (irritable bowel syndrome)   . 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 Mitral regurgitation   . OSA on CPAP   . Tubular adenoma 12/28/10    Past Surgical History:  Procedure Laterality Date  . CHOLECYSTECTOMY    . COLONOSCOPY  2007   friable anal canal, hyperplastic polyp  . COLONOSCOPY  12/28/2010   Dr. Gala Romney- tubular adenoma, hyperplastic polyp  . COLONOSCOPY N/A 01/16/2015   Rourk: anal canal and internal hemorrhois. next surveillance colonoscopy 12/2019  . EAR PINNA RECONSTRUCTION W/ RIB GRAFT     right  .  ESOPHAGOGASTRODUODENOSCOPY     multiple dilations, last EGD 2007 showed small hh, adenomatous appearing gastric mucosa in body but biopsies benign. SB bx negative for Celiac.  Marland Kitchen ESOPHAGOGASTRODUODENOSCOPY  12/28/2010   Dr. Gala Romney- normal esophagus s/p dilationpatchy erythema and erosions.  Marland Kitchen FOOT SURGERY     right  . INGUINAL HERNIA REPAIR     left  . INGUINAL HERNIA REPAIR Left  07/20/2018   Procedure: LEFT INDIRECT INGUINAL AND FEMORAL HERNIA REPAIR WITH MESH;  Surgeon: Virl Cagey, MD;  Location: [facility];  Service: General;  Laterality: Left;  . LUNG BIOPSY  2009   right  . MALONEY DILATION  12/28/2010   Procedure: Venia Minks DILATION;  Surgeon: Daneil Dolin, MD;  Location: [facility] SUITE;  Service: Endoscopy;  Laterality: N/A;  . TONSILLECTOMY    . TRANSTHORACIC ECHOCARDIOGRAM  02/2011   EF =>55%, normal chamber size & function; mild mitral and aortic insuff, mild-mod tricuspid insuff; mild pulm htn with rsvp of 29mHg    Current Outpatient Medications  Medication Sig Dispense Refill  . albuterol (VENTOLIN HFA) 108 (90 Base) MCG/ACT inhaler Inhale 2 puffs into the lungs every 6 (six) hours as needed for wheezing or shortness of breath. 6.7 g 6  . ANORO ELLIPTA 62.5-25 MCG/INH AEPB Inhale 1 puff by mouth once daily 60 each 0  . aspirin 81 MG tablet Take 81 mg by mouth daily.      . fenofibrate (TRICOR) 145 MG tablet Take 145 mg by mouth at bedtime.     . fish oil-omega-3 fatty acids 1000 MG capsule Take 1 g by mouth 2 (two) times daily.     .Marland KitchenHYDROcodone-acetaminophen (NORCO) 10-325 MG per tablet Take 1 tablet by mouth 3 (three) times daily as needed for moderate pain.     .Marland Kitchenlosartan (COZAAR) 100 MG tablet Take 1 tablet by mouth once daily 90 tablet 1  . Multiple Vitamin (MULTIVITAMIN) capsule Take 1 capsule by mouth daily.      . nitroGLYCERIN (NITROSTAT) 0.4 MG SL tablet Place 1 tablet (0.4 mg total) under the tongue every 5 (five) minutes as needed for chest pain. 25 tablet 3  . omeprazole (PRILOSEC) 20 MG capsule Take 20 mg by mouth daily.   2  . simvastatin (ZOCOR) 20 MG tablet Take 1 tablet by mouth once daily 90 tablet 3   No current facility-administered medications for this visit.    Allergies as of 03/24/2020 - Review Complete 03/24/2020  Allergen Reaction Noted  . Bee venom Swelling 12/28/2010    Family History  Problem Relation Age of Onset    . Heart disease Father 453      etoh/breathing problmes  . COPD Father   . Heart attack Maternal Grandfather   . Diabetes Paternal Grandmother   . COPD Paternal Grandmother   . Heart Problems Paternal Grandfather   . GI problems Mother   . Hyperlipidemia Mother   . COPD Mother   . Heart attack Maternal Grandmother        also valvular problems  . Heart attack Brother        stenting x3, also cancer  . Skin cancer Brother   . Hypertension Brother   . Hypertension Sister   . Colon cancer Neg Hx   . Liver disease Neg Hx   . Inflammatory bowel disease Neg Hx     Social History   Socioeconomic History  . Marital status: Married    Spouse name: Not on file  .  Number of children: 1  . Years of education: Not on file  . Highest education level: Not on file  Occupational History  . Occupation: disability    Employer: UNEMPLOYED  Tobacco Use  . Smoking status: Current Every Day Smoker    Packs/day: 0.50    Years: 37.00    Pack years: 18.50    Types: Cigarettes    Start date: 05/23/1978  . Smokeless tobacco: Never Used  . Tobacco comment: down to 0.25ppd  Vaping Use  . Vaping Use: Some days  Substance and Sexual Activity  . Alcohol use: No    Alcohol/week: 0.0 standard drinks  . Drug use: No  . Sexual activity: Not on file  Other Topics Concern  . Not on file  Social History Narrative   Son, age 32, hit by truck.   Drinks about 2-3 cups of coffee a day. Occasionally drinks tea.    Social Determinants of Health   Financial Resource Strain:   . Difficulty of Paying Living Expenses: Not on file  Food Insecurity:   . Worried About Charity fundraiser in the Last Year: Not on file  . Ran Out of Food in the Last Year: Not on file  Transportation Needs:   . Lack of Transportation (Medical): Not on file  . Lack of Transportation (Non-Medical): Not on file  Physical Activity:   . Days of Exercise per Week: Not on file  . Minutes of Exercise per Session: Not on file   Stress:   . Feeling of Stress : Not on file  Social Connections:   . Frequency of Communication with Friends and Family: Not on file  . Frequency of Social Gatherings with Friends and Family: Not on file  . Attends Religious Services: Not on file  . Active Member of Clubs or Organizations: Not on file  . Attends Archivist Meetings: Not on file  . Marital Status: Not on file  Intimate Partner Violence:   . Fear of Current or Ex-Partner: Not on file  . Emotionally Abused: Not on file  . Physically Abused: Not on file  . Sexually Abused: Not on file    Subjective: Review of Systems  Constitutional: Negative for chills, fever, malaise/fatigue and weight loss.  HENT: Negative for congestion and sore throat.   Respiratory: Negative for cough and shortness of breath.   Cardiovascular: Negative for chest pain and palpitations.  Gastrointestinal: Positive for abdominal pain. Negative for blood in stool, constipation, diarrhea, heartburn, melena, nausea and vomiting.  Musculoskeletal: Negative for joint pain and myalgias.  Skin: Negative for rash.  Neurological: Negative for dizziness and weakness.  Endo/Heme/Allergies: Does not bruise/bleed easily.  Psychiatric/Behavioral: Negative for depression. The patient is not nervous/anxious.   All other systems reviewed and are negative.      Objective: BP (!) 141/92   Pulse 65   Temp (!) 97.3 F (36.3 C) (Temporal)   Ht _0  (1.702 m)   Wt 199 lb (90.3 kg)   BMI 31.17 kg/m  Physical Exam Vitals and nursing note reviewed.  Constitutional:      General: He is not in acute distress.    Appearance: Normal appearance. He is obese. He is not ill-appearing, toxic-appearing or diaphoretic.  HENT:     Head: Normocephalic and atraumatic.     Nose: No congestion or rhinorrhea.  Eyes:     General: No scleral icterus. Cardiovascular:     Rate and Rhythm: Normal rate and regular rhythm.  Heart sounds: Normal heart sounds.   Pulmonary:     Effort: Pulmonary effort is normal.     Breath sounds: Normal breath sounds.  Abdominal:     General: Bowel sounds are normal. There is no distension.     Palpations: Abdomen is soft. There is no hepatomegaly, splenomegaly or mass.     Tenderness: There is no abdominal tenderness. There is no guarding or rebound.     Hernia: No hernia is present.    Musculoskeletal:     Cervical back: Neck supple.  Skin:    General: Skin is warm and dry.     Coloration: Skin is not jaundiced.     Findings: No bruising or rash.  Neurological:     General: No focal deficit present.     Mental Status: He is alert and oriented to person, place, and time. Mental status is at baseline.  Psychiatric:        Mood and Affect: Mood normal.        Behavior: Behavior normal.        Thought Content: Thought content normal.      Assessment:  Pleasant 69 year old male presents on referral from primary care, initially to schedule colonoscopy but he is due for.  However there is a subsequent referral received a few weeks ago noting right-sided abdominal pain.  History of colon polyps: History of colon polyps as noted above in 2016 with a recommended 5-year repeat exam.  At this point we will schedule his colonoscopy.  No red flag/warning signs or symptoms.  GERD: He is doing well on low-dose Prilosec 20 mg once daily.  If he stops taking his medication he does have significant flares of his GERD symptoms.  His son expressed some concern to the patient about taking PPI ongoing.  We had a discussion about the risks versus benefits and have agreed to continue with Protonix 20 mg daily.  Call for worsening symptoms  Right-sided abdominal pain: As noted above, patient states this feels different than it did when he was last seen in our office around 2016 or 2017.  On description it seems to be more musculoskeletal in nature.  He does have a history of neck and musculoskeletal issues.  On exam the area of  tenderness seems to be a bit thickened or hypertrophied.  CT of the abdomen and pelvis was completed and found no acute findings.  Specifically, no mention of musculoskeletal findings.  Denies constipation.  GERD well managed.  At this point I will check a spot ultrasound of the area of hypertrophy to relate any concerns.  Overall, that this does feel like a musculoskeletal etiology.  However, he is due for colonoscopy and this will help confirm no significant pathology.  I will also check basic labs including CBC, CMP, lipase.  Proceed with colonoscopy by Dr. Gala Romney on propofol/MAC in near future: the risks, benefits, and alternatives have been discussed with the patient in detail. The patient states understanding and desires to proceed.  The patient is currently on hydrocodone. The patient is not on any other anticoagulants, anxiolytics, chronic pain medications, antidepressants, antidiabetics, or iron supplements.  We will plan for the procedure on propofol/MAC to promote adequate sedation based on increase sedation needed previous exam.  ASA II   Plan: 1. Right-sided abdominal ultrasound 2. CBC, CMP, lipase 3. Call for worsening symptoms 4. Follow-up in 2 months    Thank you for allowing [place] to participate in the care of [name]  Walden Field, DNP, AGNP-C Adult & Gerontological Nurse Practitioner [facility]   03/24/2020 9:26 AM   Disclaimer: This note was dictated with voice recognition software. Similar sounding words can inadvertently be transcribed and may not be corrected upon review.

## 2020-03-24 NOTE — Telephone Encounter (Signed)
Patient returned call. He is aware of his ultrasound appt details. He voiced understanding He has been scheduled for procedure on 11/18 at 11:15am. Aware will mail instructions with covid test appt. Confirmed mailing address.

## 2020-03-25 LAB — COMPREHENSIVE METABOLIC PANEL
ALT: 17 IU/L (ref 0–44)
AST: 16 IU/L (ref 0–40)
Albumin/Globulin Ratio: 1.6 (ref 1.2–2.2)
Albumin: 4.3 g/dL (ref 3.8–4.8)
Alkaline Phosphatase: 89 IU/L (ref 44–121)
BUN/Creatinine Ratio: 10 (ref 10–24)
BUN: 11 mg/dL (ref 8–27)
Bilirubin Total: 0.4 mg/dL (ref 0.0–1.2)
CO2: 23 mmol/L (ref 20–29)
Calcium: 10.3 mg/dL — ABNORMAL HIGH (ref 8.6–10.2)
Chloride: 102 mmol/L (ref 96–106)
Creatinine, Ser: 1.09 mg/dL (ref 0.76–1.27)
GFR calc Af Amer: 81 mL/min/{1.73_m2} (ref >59)
GFR calc non Af Amer: 70 mL/min/{1.73_m2} (ref >59)
Globulin, Total: 2.7 g/dL (ref 1.5–4.5)
Glucose: 92 mg/dL (ref 65–99)
Potassium: 4.7 mmol/L (ref 3.5–5.2)
Sodium: 141 mmol/L (ref 134–144)
Total Protein: 7 g/dL (ref 6.0–8.5)

## 2020-03-25 LAB — CBC/DIFF AMBIGUOUS DEFAULT
Basophils Absolute: 0.1 10*3/uL (ref 0.0–0.2)
Basos: 1 %
EOS (ABSOLUTE): 0.4 10*3/uL (ref 0.0–0.4)
Eos: 3 %
Hematocrit: 47.5 % (ref 37.5–51.0)
Hemoglobin: 15.8 g/dL (ref 13.0–17.7)
Immature Grans (Abs): 0 10*3/uL (ref 0.0–0.1)
Immature Granulocytes: 0 %
Lymphocytes Absolute: 3.2 10*3/uL — ABNORMAL HIGH (ref 0.7–3.1)
Lymphs: 24 %
MCH: 30.9 pg (ref 26.6–33.0)
MCHC: 33.3 g/dL (ref 31.5–35.7)
MCV: 93 fL (ref 79–97)
Monocytes Absolute: 1.1 10*3/uL — ABNORMAL HIGH (ref 0.1–0.9)
Monocytes: 8 %
Neutrophils Absolute: 8.7 10*3/uL — ABNORMAL HIGH (ref 1.4–7.0)
Neutrophils: 64 %
Platelets: 285 10*3/uL (ref 150–450)
RBC: 5.12 x10E6/uL (ref 4.14–5.80)
RDW: 12.6 % (ref 11.6–15.4)
WBC: 13.5 10*3/uL — ABNORMAL HIGH (ref 3.4–10.8)

## 2020-03-25 LAB — LIPASE: Lipase: 20 U/L (ref 13–78)

## 2020-03-25 LAB — SPECIMEN STATUS REPORT

## 2020-03-30 DIAGNOSIS — G894 Chronic pain syndrome: Secondary | ICD-10-CM | POA: Diagnosis not present

## 2020-03-30 DIAGNOSIS — E7849 Other hyperlipidemia: Secondary | ICD-10-CM | POA: Diagnosis not present

## 2020-03-30 DIAGNOSIS — Z6831 Body mass index (BMI) 31.0-31.9, adult: Secondary | ICD-10-CM | POA: Diagnosis not present

## 2020-03-30 DIAGNOSIS — E6609 Other obesity due to excess calories: Secondary | ICD-10-CM | POA: Diagnosis not present

## 2020-03-31 ENCOUNTER — Other Ambulatory Visit: Payer: Self-pay

## 2020-03-31 ENCOUNTER — Ambulatory Visit (HOSPITAL_COMMUNITY)
Admission: RE | Admit: 2020-03-31 | Discharge: 2020-03-31 | Disposition: A | Discharge status: Home / Self Care | Payer: Medicare Other | Source: Ambulatory Visit | Attending: Nurse Practitioner | Admitting: Nurse Practitioner

## 2020-03-31 DIAGNOSIS — Z86010 Personal history of colonic polyps: Secondary | ICD-10-CM | POA: Insufficient documentation

## 2020-03-31 DIAGNOSIS — R109 Unspecified abdominal pain: Secondary | ICD-10-CM | POA: Diagnosis not present

## 2020-03-31 DIAGNOSIS — K219 Gastro-esophageal reflux disease without esophagitis: Secondary | ICD-10-CM | POA: Insufficient documentation

## 2020-04-01 ENCOUNTER — Other Ambulatory Visit: Payer: Self-pay | Admitting: Nurse Practitioner

## 2020-04-01 DIAGNOSIS — D72829 Elevated white blood cell count, unspecified: Secondary | ICD-10-CM

## 2020-04-02 ENCOUNTER — Other Ambulatory Visit: Payer: Self-pay

## 2020-04-07 ENCOUNTER — Encounter (HOSPITAL_COMMUNITY): Payer: Self-pay | Admitting: Internal Medicine

## 2020-04-08 ENCOUNTER — Other Ambulatory Visit: Payer: Self-pay

## 2020-04-08 ENCOUNTER — Other Ambulatory Visit (HOSPITAL_COMMUNITY)
Admission: RE | Admit: 2020-04-08 | Discharge: 2020-04-08 | Disposition: A | Discharge status: Home / Self Care | Payer: Medicare Other | Source: Ambulatory Visit | Attending: Internal Medicine | Admitting: Internal Medicine

## 2020-04-08 DIAGNOSIS — Z20822 Contact with and (suspected) exposure to covid-19: Secondary | ICD-10-CM | POA: Insufficient documentation

## 2020-04-08 DIAGNOSIS — Z01812 Encounter for preprocedural laboratory examination: Secondary | ICD-10-CM | POA: Diagnosis not present

## 2020-04-08 LAB — SARS CORONAVIRUS 2 (TAT 6-24 HRS): SARS Coronavirus 2: NEGATIVE

## 2020-04-09 ENCOUNTER — Encounter (HOSPITAL_COMMUNITY): Payer: Self-pay | Admitting: Internal Medicine

## 2020-04-09 ENCOUNTER — Ambulatory Visit (HOSPITAL_COMMUNITY): Payer: Medicare Other | Admitting: Anesthesiology

## 2020-04-09 ENCOUNTER — Ambulatory Visit (HOSPITAL_COMMUNITY)
Admission: RE | Admit: 2020-04-09 | Discharge: 2020-04-09 | Disposition: A | Discharge status: Home / Self Care | Payer: Medicare Other | Attending: Internal Medicine | Admitting: Internal Medicine

## 2020-04-09 ENCOUNTER — Encounter (HOSPITAL_COMMUNITY)
Admission: RE | Disposition: A | Discharge status: Home / Self Care | Payer: Self-pay | Source: Home / Self Care | Attending: Internal Medicine

## 2020-04-09 ENCOUNTER — Other Ambulatory Visit: Payer: Self-pay

## 2020-04-09 DIAGNOSIS — Z79899 Other long term (current) drug therapy: Secondary | ICD-10-CM | POA: Diagnosis not present

## 2020-04-09 DIAGNOSIS — Z8719 Personal history of other diseases of the digestive system: Secondary | ICD-10-CM | POA: Diagnosis not present

## 2020-04-09 DIAGNOSIS — Z7982 Long term (current) use of aspirin: Secondary | ICD-10-CM | POA: Insufficient documentation

## 2020-04-09 DIAGNOSIS — Z86010 Personal history of colonic polyps: Secondary | ICD-10-CM | POA: Diagnosis not present

## 2020-04-09 DIAGNOSIS — R109 Unspecified abdominal pain: Secondary | ICD-10-CM | POA: Diagnosis not present

## 2020-04-09 DIAGNOSIS — Z09 Encounter for follow-up examination after completed treatment for conditions other than malignant neoplasm: Secondary | ICD-10-CM | POA: Diagnosis not present

## 2020-04-09 DIAGNOSIS — I10 Essential (primary) hypertension: Secondary | ICD-10-CM | POA: Diagnosis not present

## 2020-04-09 DIAGNOSIS — J449 Chronic obstructive pulmonary disease, unspecified: Secondary | ICD-10-CM | POA: Diagnosis not present

## 2020-04-09 HISTORY — PX: COLONOSCOPY WITH PROPOFOL: SHX5780

## 2020-04-09 SURGERY — COLONOSCOPY WITH PROPOFOL
Anesthesia: General

## 2020-04-09 MED ORDER — STERILE WATER FOR IRRIGATION IR SOLN
Status: DC | PRN
  Administered 2020-04-09: 1.5 mL

## 2020-04-09 MED ORDER — PROPOFOL 500 MG/50ML IV EMUL
INTRAVENOUS | Status: DC | PRN
  Administered 2020-04-09: 125 ug/kg/min via INTRAVENOUS

## 2020-04-09 MED ORDER — LIDOCAINE HCL (CARDIAC) PF 100 MG/5ML IV SOSY
PREFILLED_SYRINGE | INTRAVENOUS | Status: DC | PRN
  Administered 2020-04-09: 60 mg via INTRAVENOUS

## 2020-04-09 MED ORDER — LACTATED RINGERS IV SOLN: INTRAVENOUS | Status: DC | PRN

## 2020-04-09 MED ORDER — LACTATED RINGERS IV SOLN: Freq: Once | INTRAVENOUS | Status: AC

## 2020-04-09 MED ORDER — PROPOFOL 10 MG/ML IV BOLUS
INTRAVENOUS | Status: DC | PRN
  Administered 2020-04-09: 20 mg via INTRAVENOUS
  Administered 2020-04-09: 80 mg via INTRAVENOUS

## 2020-04-09 NOTE — Discharge Instructions (Signed)
Colonoscopy Discharge Instructions  Read the instructions outlined below and refer to this sheet in the next few weeks. These discharge instructions provide you with general information on caring for yourself after you leave the hospital. Your doctor may also give you specific instructions. While your treatment has been planned according to the most current medical practices available, unavoidable complications occasionally occur. If you have any problems or questions after discharge, call Dr. Gala Romney at [phone]. ACTIVITY  You may resume your regular activity, but move at a slower pace for the next 24 hours.   Take frequent rest periods for the next 24 hours.   Walking will help get rid of the air and reduce the bloated feeling in your belly (abdomen).   No driving for 24 hours (because of the medicine (anesthesia) used during the test).    Do not sign any important legal documents or operate any machinery for 24 hours (because of the anesthesia used during the test).  NUTRITION  Drink plenty of fluids.   You may resume your normal diet as instructed by your doctor.   Begin with a light meal and progress to your normal diet. Heavy or fried foods are harder to digest and may make you feel sick to your stomach (nauseated).   Avoid alcoholic beverages for 24 hours or as instructed.  MEDICATIONS  You may resume your normal medications unless your doctor tells you otherwise.  WHAT YOU CAN EXPECT TODAY  Some feelings of bloating in the abdomen.   Passage of more gas than usual.   Spotting of blood in your stool or on the toilet paper.  IF YOU HAD POLYPS REMOVED DURING THE COLONOSCOPY:  No aspirin products for 7 days or as instructed.   No alcohol for 7 days or as instructed.   Eat a soft diet for the next 24 hours.  FINDING OUT THE RESULTS OF YOUR TEST Not all test results are available during your visit. If your test results are not back during the visit, make an appointment  with your caregiver to find out the results. Do not assume everything is normal if you have not heard from your caregiver or the medical facility. It is important for you to follow up on all of your test results.  SEEK IMMEDIATE MEDICAL ATTENTION IF:  You have more than a spotting of blood in your stool.   Your belly is swollen (abdominal distention).   You are nauseated or vomiting.   You have a temperature over 101.   You have abdominal pain or discomfort that is severe or gets worse throughout the day.   No polyps found today  Return for 1 more colonoscopy in 7 years  Keep your upcoming office visit with [place]  At patient request, I called Ann at [phone]  -left message with findings and recommendations on answering service      Monitored Anesthesia Care, Care After These instructions provide you with information about caring for yourself after your procedure. Your health care provider may also give you more specific instructions. Your treatment has been planned according to current medical practices, but problems sometimes occur. Call your health care provider if you have any problems or questions after your procedure. What can I expect after the procedure? After your procedure, you may:  Feel sleepy for several hours.  Feel clumsy and have poor balance for several hours.  Feel forgetful about what happened after the procedure.  Have poor judgment for several hours.  Feel nauseous or  vomit.  Have a sore throat if you had a breathing tube during the procedure. Follow these instructions at home: For at least 24 hours after the procedure:      Have a responsible adult stay with you. It is important to have someone help care for you until you are awake and alert.  Rest as needed.  Do not: ? Participate in activities in which you could fall or become injured. ? Drive. ? Use heavy machinery. ? Drink alcohol. ? Take sleeping pills or medicines that cause  drowsiness. ? Make important decisions or sign legal documents. ? Take care of children on your own. Eating and drinking  Follow the diet that is recommended by your health care provider.  If you vomit, drink water, juice, or soup when you can drink without vomiting.  Make sure you have little or no nausea before eating solid foods. General instructions  Take over-the-counter and prescription medicines only as told by your health care provider.  If you have sleep apnea, surgery and certain medicines can increase your risk for breathing problems. Follow instructions from your health care provider about wearing your sleep device: ? Anytime you are sleeping, including during daytime naps. ? While taking prescription pain medicines, sleeping medicines, or medicines that make you drowsy.  If you smoke, do not smoke without supervision.  Keep all follow-up visits as told by your health care provider. This is important. Contact a health care provider if:  You keep feeling nauseous or you keep vomiting.  You feel light-headed.  You develop a rash.  You have a fever. Get help right away if:  You have trouble breathing. Summary  For several hours after your procedure, you may feel sleepy and have poor judgment.  Have a responsible adult stay with you for at least 24 hours or until you are awake and alert. This information is not intended to replace advice given to you by your health care provider. Make sure you discuss any questions you have with your health care provider. Document Revised: 08/07/2017 Document Reviewed: 08/30/2015 Elsevier Patient Education  [place].

## 2020-04-09 NOTE — Interval H&P Note (Signed)
History and Physical Interval Note:  04/09/2020 12:20 PM  [name]  has presented today for surgery, with the diagnosis of hx polyps, abd pain.  The various methods of treatment have been discussed with the patient and family. After consideration of risks, benefits and other options for treatment, the patient has consented to  Procedure(s) with comments: COLONOSCOPY WITH PROPOFOL (N/A) - 11:15am as a surgical intervention.  The patient's history has been reviewed, patient examined, no change in status, stable for surgery.  I have reviewed the patient's chart and labs.  Questions were answered to the patient's satisfaction.     [name]  No change.  Surveillance colonoscopy today per plan.The risks, benefits, limitations, alternatives and imponderables have been reviewed with the patient. Questions have been answered. All parties are agreeable.

## 2020-04-09 NOTE — Anesthesia Preprocedure Evaluation (Addendum)
Anesthesia Evaluation  Patient identified by MRN, date of birth, ID band Patient awake    Reviewed: Allergy & Precautions, NPO status , Patient's Chart, lab work & pertinent test results  History of Anesthesia Complications Negative for: history of anesthetic complications  Airway Mallampati: II  TM Distance: >3 FB Neck ROM: Full    Dental  (+) Edentulous Upper, Edentulous Lower   Pulmonary asthma , sleep apnea , COPD,  COPD inhaler, Current SmokerPatient did not abstain from smoking.,    Pulmonary exam normal breath sounds clear to auscultation       Cardiovascular METS: 3 - Mets hypertension, Pt. on medications + angina (sometimes gets angina with exertion) with exertion + CAD (nonobstructive)  Normal cardiovascular exam Rhythm:Regular Rate:Normal     Neuro/Psych PSYCHIATRIC DISORDERS Anxiety Depression  Neuromuscular disease    GI/Hepatic Neg liver ROS, Bowel prep,GERD  Medicated and Controlled,  Endo/Other  negative endocrine ROS  Renal/GU negative Renal ROS     Musculoskeletal  (+) Arthritis  (back pain),   Abdominal   Peds  Hematology negative hematology ROS (+)   Anesthesia Other Findings   Reproductive/Obstetrics negative OB ROS                            Anesthesia Physical Anesthesia Plan  ASA: III  Anesthesia Plan: General   Post-op Pain Management:    Induction: Intravenous  PONV Risk Score and Plan: TIVA  Airway Management Planned: Nasal Cannula and Natural Airway  Additional Equipment:   Intra-op Plan:   Post-operative Plan:   Informed Consent: I have reviewed the patients History and Physical, chart, labs and discussed the procedure including the risks, benefits and alternatives for the proposed anesthesia with the patient or authorized representative who has indicated his/her understanding and acceptance.     Dental advisory given  Plan Discussed with:  CRNA and Surgeon  Anesthesia Plan Comments:         Anesthesia Quick Evaluation

## 2020-04-09 NOTE — Anesthesia Postprocedure Evaluation (Signed)
Anesthesia Post Note  Patient: [name]  Procedure(s) Performed: COLONOSCOPY WITH PROPOFOL (N/A )  Patient location during evaluation: PACU Anesthesia Type: General Level of consciousness: awake and alert and oriented Pain management: pain level controlled Vital Signs Assessment: post-procedure vital signs reviewed and stable Respiratory status: spontaneous breathing Cardiovascular status: stable Postop Assessment: no apparent nausea or vomiting Anesthetic complications: no   No complications documented.   Last Vitals:  Vitals:   04/09/20 0956  BP: 132/78  Resp: 16  Temp: 37 C  SpO2: 96%    Last Pain:  Vitals:   04/09/20 1223  TempSrc:   PainSc: Eugene Jt [place]

## 2020-04-09 NOTE — Transfer of Care (Signed)
Immediate Anesthesia Transfer of Care Note  Patient: [name]  Procedure(s) Performed: COLONOSCOPY WITH PROPOFOL (N/A )  Patient Location: PACU  Anesthesia Type:General  Level of Consciousness: awake, alert  and oriented  Airway & Oxygen Therapy: Patient Spontanous Breathing  Post-op Assessment: Report given to RN and Post -op Vital signs reviewed and stable  Post vital signs: Reviewed and stable  Last Vitals:  Vitals Value Taken Time  BP    Temp    Pulse    Resp    SpO2      Last Pain:  Vitals:   04/09/20 1223  TempSrc:   PainSc: 5       Patients Stated Pain Goal: 6 (23/36/12 2449)  Complications: No complications documented.

## 2020-04-09 NOTE — Op Note (Signed)
[facility] Patient Name: [name] Procedure Date: 04/09/2020 11:35 AM MRN: [id number] Date of Birth: 1950/09/07 Attending MD: Norvel Richards , MD CSN: [id number] Age: 69 Admit Type: Outpatient Procedure:                Colonoscopy Indications:              High risk colon cancer surveillance: Personal                            history of colonic polyps Providers:                Norvel Richards, MD, Caprice Kluver, Kristine L.                            Risa Grill, Technician, Raphael Gibney, Technician Referring MD:              Medicines:                Propofol per Anesthesia Complications:            No immediate complications. Estimated Blood Loss:     Estimated blood loss: none. Procedure:                Pre-Anesthesia Assessment:                           - Prior to the procedure, a History and Physical                            was performed, and patient medications and                            allergies were reviewed. The patient's tolerance of                            previous anesthesia was also reviewed. The risks                            and benefits of the procedure and the sedation                            options and risks were discussed with the patient.                            All questions were answered, and informed consent                            was obtained. Prior Anticoagulants: The patient has                            taken no previous anticoagulant or antiplatelet                            agents. ASA Grade Assessment: II - A patient with  mild systemic disease. After reviewing the risks                            and benefits, the patient was deemed in                            satisfactory condition to undergo the procedure.                           After obtaining informed consent, the colonoscope                            was passed under direct vision. Throughout the                             procedure, the patient's blood pressure, pulse, and                            oxygen saturations were monitored continuously. The                            CF-HQ190L ([id number]) scope was introduced through                            the anus and advanced to the the cecum, identified                            by appendiceal orifice and ileocecal valve. The                            colonoscopy was performed without difficulty. The                            patient tolerated the procedure well. The quality                            of the bowel preparation was adequate. Scope In: 12:30:21 PM Scope Out: 12:45:31 PM Scope Withdrawal Time: 0 hours 10 minutes 52 seconds  Total Procedure Duration: 0 hours 15 minutes 10 seconds  Findings:      The perianal and digital rectal examinations were normal.      The colon (entire examined portion) appeared normal.      The entire examined colon appeared normal on direct and retroflexion       views. Impression:               - The entire examined colon is normal.                           - The entire examined colon is normal on direct and                            retroflexion views.                           -  No specimens collected. No explanation for right                            lower quadrant abdominal pain found today. Recent                            CT scan negative. Query underlying chronic                            appendicitis. Moderate Sedation:      Moderate (conscious) sedation was personally administered by an       anesthesia professional. The following parameters were monitored: oxygen       saturation, heart rate, blood pressure, respiratory rate, EKG, adequacy       of pulmonary ventilation, and response to care.      Moderate (conscious) sedation was administered by the endoscopy nurse       and supervised by the endoscopist. The following parameters were       monitored: oxygen saturation, heart rate, blood  pressure, respiratory       rate, EKG, adequacy of pulmonary ventilation, and response to care. Recommendation:           - Patient has a contact number available for                            emergencies. The signs and symptoms of potential                            delayed complications were discussed with the                            patient. Return to normal activities tomorrow.                            Written discharge instructions were provided to the                            patient.                           - Resume previous diet.                           - Continue present medications.                           - Repeat colonoscopy in 7 years for surveillance.                           - Return to GI office (date not yet determined).                            Reassess right lower quadrant abdominal pain at                            follow-up visit. Consider surgical consultation for  diagnostic laparoscopy with incidental appendectomy. Procedure Code(s):        --- Professional ---                           [phone], Colonoscopy, flexible; diagnostic, including                            collection of specimen(s) by brushing or washing,                            when performed (separate procedure) Diagnosis Code(s):        --- Professional ---                           Z86.010, Personal history of colonic polyps CPT copyright 2019 American Medical Association. All rights reserved. The codes documented in this report are preliminary and upon coder review may  be revised to meet current compliance requirements. Cristopher Estimable. Tenika Keeran, MD Norvel Richards, MD 04/09/2020 12:54:43 PM This report has been signed electronically. Number of Addenda: 0

## 2020-04-14 ENCOUNTER — Encounter (HOSPITAL_COMMUNITY): Payer: Self-pay | Admitting: Internal Medicine

## 2020-04-17 ENCOUNTER — Other Ambulatory Visit: Payer: Self-pay | Admitting: Nurse Practitioner

## 2020-04-17 DIAGNOSIS — D72829 Elevated white blood cell count, unspecified: Secondary | ICD-10-CM | POA: Diagnosis not present

## 2020-04-18 LAB — CBC WITH DIFFERENTIAL/PLATELET
Basophils Absolute: 0.1 10*3/uL (ref 0.0–0.2)
Basos: 1 %
EOS (ABSOLUTE): 0.4 10*3/uL (ref 0.0–0.4)
Eos: 4 %
Hematocrit: 43.6 % (ref 37.5–51.0)
Hemoglobin: 14.9 g/dL (ref 13.0–17.7)
Immature Grans (Abs): 0.1 10*3/uL (ref 0.0–0.1)
Immature Granulocytes: 1 %
Lymphocytes Absolute: 3.2 10*3/uL — ABNORMAL HIGH (ref 0.7–3.1)
Lymphs: 27 %
MCH: 31.7 pg (ref 26.6–33.0)
MCHC: 34.2 g/dL (ref 31.5–35.7)
MCV: 93 fL (ref 79–97)
Monocytes Absolute: 0.9 10*3/uL (ref 0.1–0.9)
Monocytes: 8 %
Neutrophils Absolute: 6.9 10*3/uL (ref 1.4–7.0)
Neutrophils: 59 %
Platelets: 280 10*3/uL (ref 150–450)
RBC: 4.7 x10E6/uL (ref 4.14–5.80)
RDW: 12.7 % (ref 11.6–15.4)
WBC: 11.5 10*3/uL — ABNORMAL HIGH (ref 3.4–10.8)

## 2020-04-20 ENCOUNTER — Telehealth: Payer: Self-pay

## 2020-04-20 NOTE — Telephone Encounter (Signed)
Received CBC results from [facility]. Lab orders are on Walden Field, NP's desk for review.

## 2020-04-21 DIAGNOSIS — E7849 Other hyperlipidemia: Secondary | ICD-10-CM | POA: Diagnosis not present

## 2020-04-21 DIAGNOSIS — K219 Gastro-esophageal reflux disease without esophagitis: Secondary | ICD-10-CM | POA: Diagnosis not present

## 2020-04-22 NOTE — Telephone Encounter (Signed)
See result note for details.

## 2020-05-07 ENCOUNTER — Ambulatory Visit: Payer: Medicare Other | Admitting: General Surgery

## 2020-05-07 DIAGNOSIS — J069 Acute upper respiratory infection, unspecified: Secondary | ICD-10-CM | POA: Diagnosis not present

## 2020-05-07 DIAGNOSIS — Z681 Body mass index (BMI) 19 or less, adult: Secondary | ICD-10-CM | POA: Diagnosis not present

## 2020-05-21 DIAGNOSIS — Z6831 Body mass index (BMI) 31.0-31.9, adult: Secondary | ICD-10-CM | POA: Diagnosis not present

## 2020-05-21 DIAGNOSIS — S20219A Contusion of unspecified front wall of thorax, initial encounter: Secondary | ICD-10-CM | POA: Diagnosis not present

## 2020-05-21 DIAGNOSIS — I10 Essential (primary) hypertension: Secondary | ICD-10-CM | POA: Diagnosis not present

## 2020-05-21 DIAGNOSIS — J449 Chronic obstructive pulmonary disease, unspecified: Secondary | ICD-10-CM | POA: Diagnosis not present

## 2020-05-26 ENCOUNTER — Ambulatory Visit: Payer: Medicare Other | Admitting: Nurse Practitioner

## 2020-05-27 ENCOUNTER — Ambulatory Visit: Payer: Medicare Other | Admitting: Nurse Practitioner

## 2020-05-27 ENCOUNTER — Other Ambulatory Visit: Payer: Self-pay

## 2020-05-27 ENCOUNTER — Encounter: Payer: Self-pay | Admitting: Nurse Practitioner

## 2020-05-27 VITALS — BP 137/86 | HR 68 | Temp 97.1°F | Ht 66.0 in | Wt 202.2 lb

## 2020-05-27 DIAGNOSIS — K219 Gastro-esophageal reflux disease without esophagitis: Secondary | ICD-10-CM

## 2020-05-27 DIAGNOSIS — R1031 Right lower quadrant pain: Secondary | ICD-10-CM | POA: Diagnosis not present

## 2020-05-27 DIAGNOSIS — K4090 Unilateral inguinal hernia, without obstruction or gangrene, not specified as recurrent: Secondary | ICD-10-CM | POA: Diagnosis not present

## 2020-05-27 NOTE — Progress Notes (Signed)
Cc'ed to pcp °

## 2020-05-27 NOTE — Patient Instructions (Signed)
Your health issues we discussed today were:   Abdominal pain with known right-sided inguinal hernia: 1. As we discussed, your ultrasound, CT scan, and colonoscopy did not find a cause for your pain 2. There is a question of possible chronic, mild appendicitis 3. At your appointment at the end of the month with the surgeon to discuss hernia repair, discuss the abdominal pain you are having and see if your inguinal hernia could be contributing to this 4. Call us for any worsening or severe symptoms  Overall I recommend:  1. Continue other current medications 2. Return for follow-up 6 months 3. Call us for any questions or concerns   ---------------------------------------------------------------  I am glad you have gotten your COVID-19 vaccination!  Even though you are fully vaccinated you should continue to follow CDC and state/local guidelines.  ---------------------------------------------------------------   At [facility] we value your feedback. You may receive a survey about your visit today. Please share your experience as we strive to create trusting relationships with our patients to provide genuine, compassionate, quality care.  We appreciate your understanding and patience as we review any laboratory studies, imaging, and other diagnostic tests that are ordered as we care for you. Our office policy is 5 business days for review of these results, and any emergent or urgent results are addressed in a timely manner for your best interest. If you do not hear from our office in 1 week, please contact us.   We also encourage the use of MyChart, which contains your medical information for your review as well. If you are not enrolled in this feature, an access code is on this after visit summary for your convenience. Thank you for allowing [place] to be involved in your care.  It was great to see you today!  I hope you have a Happy New Year!!

## 2020-05-27 NOTE — Progress Notes (Signed)
Referring Provider: Elfredia Nevins, MD Primary Care Physician:  Elfredia Nevins, MD Primary GI:  Dr. Jena Gauss  Chief Complaint  Patient presents with  . Gastroesophageal Reflux    Doing okay on the omeprazole  . Abdominal Pain    Comes/goes    HPI:   [name] is a 70 y.o. male who presents for follow-up on GERD and abdominal pain.  The patient was last seen in our office 03/24/2020 for the same as well as history of colon polyps.  His last visit was also to schedule colonoscopy.  History of colon polyps found on previous colonoscopy and recommended 5-year repeat, was due at his last visit.  At his last visit he noted abdominal pain with CT findings of no acute findings, primary care recommended GI follow-up.  Pain began about 1 year prior in the mid to right side described as sharp and intermittent.  It was particularly bad the previous week when he was doing outdoor activities like [employer].  He does have known back problems.  Lying down sometimes will help.  Tylenol and ibuprofen do not make a difference.  Has daily bowel movements, no GERD symptoms on Prilosec daily.  No other overt GI complaints.  Recommended CBC, CMP, lipase, right-sided abdominal ultrasound.  Overall his pain was felt to be more musculoskeletal in nature.  Labs are completed 03/24/2020 and CBC found mild leukocytosis with white blood cell count of 13.5 (although it seems he chronically has leukocytosis) but otherwise normal.  CMP essentially normal other than some hyper calcemia.  Lipase normal.  Limited abdominal ultrasound completed 03/31/2020 with no abdominal wall abnormality identified in the area of clinical concern.  Colonoscopy was completed 04/09/2020 which found normal colon, no explanation for right lower quadrant pain.  Query underlying chronic appendicitis.  Recommended repeat colonoscopy in 7 years.  Follow-up in office for evaluation of abdominal pain consider surgical consultation for  diagnostic laparoscopy with incidental appendectomy.  Today states he is doing okay overall.  His GERD is doing well on PPI; if he misses his medication will have a significant flare.  He still has abdominal pain that is intermittent, occurs about every day; pain typically mild, tolerable. Overall it's about the same as it was previously. Denies N/V, hematochezia, melena, fever, chills, unintentional weight loss. Denies URI or flu-like symptoms. Denies loss of sense of taste or smell. The patient has received COVID-19 vaccination(s). Denies chest pain, dyspnea, dizziness, lightheadedness, syncope, near syncope. Denies any other upper or lower GI symptoms.  We discussed seeing a surgeon for surgical opinion. He states he's seeing a surgeon (Dr. Henreitta Leber) end of this month to discuss right inguinal hernia.  Past Medical History:  Diagnosis Date  . Allergic rhinitis   . Anxiety and depression   . Asthma   . CAD (coronary artery disease)    Mild to moderate nonobstructive LAD disease 2006  . Chronic low back pain   . COPD (chronic obstructive pulmonary disease) (HCC)   . Dyslipidemia   . Essential hypertension   . GERD (gastroesophageal reflux disease)   . Hearing loss   . Hemorrhoids   . Hyperplastic colon polyp 12/28/10  . IBS (irritable bowel syndrome)   . Langerhan's cell histiocytosis (HCC)   . Mitral regurgitation   . OSA on CPAP   . Tubular adenoma 12/28/10    Past Surgical History:  Procedure Laterality Date  . CHOLECYSTECTOMY    . COLONOSCOPY  2007   friable anal canal, hyperplastic polyp  .  COLONOSCOPY  12/28/2010   Dr. Gala Romney- tubular adenoma, hyperplastic polyp  . COLONOSCOPY N/A 01/16/2015   Rourk: anal canal and internal hemorrhois. next surveillance colonoscopy 12/2019  . COLONOSCOPY WITH PROPOFOL N/A 04/09/2020   Procedure: COLONOSCOPY WITH PROPOFOL;  Surgeon: Daneil Dolin, MD;  Location: [facility];  Service: Endoscopy;  Laterality: N/A;  11:15am  . EAR PINNA  RECONSTRUCTION W/ RIB GRAFT     right  . ESOPHAGOGASTRODUODENOSCOPY     multiple dilations, last EGD 2007 showed small hh, adenomatous appearing gastric mucosa in body but biopsies benign. SB bx negative for Celiac.  Marland Kitchen ESOPHAGOGASTRODUODENOSCOPY  12/28/2010   Dr. Gala Romney- normal esophagus s/p dilationpatchy erythema and erosions.  Marland Kitchen FOOT SURGERY     right  . INGUINAL HERNIA REPAIR     left  . INGUINAL HERNIA REPAIR Left 07/20/2018   Procedure: LEFT INDIRECT INGUINAL AND FEMORAL HERNIA REPAIR WITH MESH;  Surgeon: Virl Cagey, MD;  Location: [facility];  Service: General;  Laterality: Left;  . LUNG BIOPSY  2009   right  . MALONEY DILATION  12/28/2010   Procedure: Venia Minks DILATION;  Surgeon: Daneil Dolin, MD;  Location: [facility];  Service: Endoscopy;  Laterality: N/A;  . TONSILLECTOMY    . TRANSTHORACIC ECHOCARDIOGRAM  02/2011   EF =>55%, normal chamber size & function; mild mitral and aortic insuff, mild-mod tricuspid insuff; mild pulm htn with rsvp of 76mmHg    Current Outpatient Medications  Medication Sig Dispense Refill  . albuterol (VENTOLIN HFA) 108 (90 Base) MCG/ACT inhaler Inhale 2 puffs into the lungs every 6 (six) hours as needed for wheezing or shortness of breath. 6.7 g 6  . ANORO ELLIPTA 62.5-25 MCG/INH AEPB Inhale 1 puff by mouth once daily (Patient taking differently: Inhale 1 puff into the lungs daily.) 60 each 0  . aspirin 81 MG tablet Take 81 mg by mouth daily.    . fenofibrate (TRICOR) 145 MG tablet Take 145 mg by mouth at bedtime.    . fish oil-omega-3 fatty acids 1000 MG capsule Take 1 g by mouth daily.    . fluticasone (FLONASE) 50 MCG/ACT nasal spray Place 2 sprays into both nostrils daily as needed for allergies.    Marland Kitchen HYDROcodone-acetaminophen (NORCO) 10-325 MG per tablet Take 0.5 tablets by mouth 3 (three) times daily as needed for moderate pain.     Marland Kitchen losartan (COZAAR) 100 MG tablet Take 1 tablet by mouth once daily 90 tablet 1  . Multiple Vitamin  (MULTIVITAMIN) capsule Take 1 capsule by mouth daily.    . nitroGLYCERIN (NITROSTAT) 0.4 MG SL tablet Place 1 tablet (0.4 mg total) under the tongue every 5 (five) minutes as needed for chest pain. 25 tablet 3  . omeprazole (PRILOSEC) 20 MG capsule Take 20 mg by mouth daily.   2  . simvastatin (ZOCOR) 20 MG tablet Take 1 tablet by mouth once daily (Patient taking differently: Take 20 mg by mouth at bedtime.) 90 tablet 3   No current facility-administered medications for this visit.    Allergies as of 05/27/2020 - Review Complete 05/27/2020  Allergen Reaction Noted  . Bee venom Swelling 12/28/2010    Family History  Problem Relation Age of Onset  . Heart disease Father 73       etoh/breathing problmes  . COPD Father   . Heart attack Maternal Grandfather   . Diabetes Paternal Grandmother   . COPD Paternal Grandmother   . Heart Problems Paternal Grandfather   . GI  problems Mother   . Hyperlipidemia Mother   . COPD Mother   . Heart attack Maternal Grandmother        also valvular problems  . Heart attack Brother        stenting x3, also cancer  . Skin cancer Brother   . Hypertension Brother   . Hypertension Sister   . Colon cancer Neg Hx   . Liver disease Neg Hx   . Inflammatory bowel disease Neg Hx     Social History   Socioeconomic History  . Marital status: Married    Spouse name: Not on file  . Number of children: 1  . Years of education: Not on file  . Highest education level: Not on file  Occupational History  . Occupation: disability    Employer: UNEMPLOYED  Tobacco Use  . Smoking status: Current Every Day Smoker    Packs/day: 0.50    Years: 37.00    Pack years: 18.50    Types: Cigarettes    Start date: 05/23/1978  . Smokeless tobacco: Never Used  . Tobacco comment: down to 0.25ppd  Vaping Use  . Vaping Use: Some days  Substance and Sexual Activity  . Alcohol use: No    Alcohol/week: 0.0 standard drinks  . Drug use: No  . Sexual activity: Not on file   Other Topics Concern  . Not on file  Social History Narrative   Son, age 13, hit by truck.   Drinks about 2-3 cups of coffee a day. Occasionally drinks tea.    Social Determinants of Health   Financial Resource Strain: Not on file  Food Insecurity: Not on file  Transportation Needs: Not on file  Physical Activity: Not on file  Stress: Not on file  Social Connections: Not on file    Subjective: Review of Systems  Constitutional: Negative for chills, fever, malaise/fatigue and weight loss.  HENT: Negative for congestion and sore throat.   Respiratory: Negative for cough and shortness of breath.   Cardiovascular: Negative for chest pain and palpitations.  Gastrointestinal: Positive for abdominal pain. Negative for blood in stool, diarrhea, heartburn, melena, nausea and vomiting.  Musculoskeletal: Negative for joint pain and myalgias.  Skin: Negative for rash.  Neurological: Negative for dizziness and weakness.  Endo/Heme/Allergies: Does not bruise/bleed easily.  Psychiatric/Behavioral: Negative for depression. The patient is not nervous/anxious.   All other systems reviewed and are negative.    Objective: BP 137/86   Pulse 68   Temp (!) 97.1 F (36.2 C)   Ht 5\' 6"  (1.676 m)   Wt 202 lb 3.2 oz (91.7 kg)   BMI 32.64 kg/m  Physical Exam Vitals and nursing note reviewed.  Constitutional:      General: He is not in acute distress.    Appearance: Normal appearance. He is not ill-appearing, toxic-appearing or diaphoretic.  HENT:     Head: Normocephalic and atraumatic.     Nose: No congestion or rhinorrhea.  Eyes:     General: No scleral icterus. Cardiovascular:     Rate and Rhythm: Normal rate and regular rhythm.     Heart sounds: Normal heart sounds.  Pulmonary:     Effort: Pulmonary effort is normal.     Breath sounds: Examination of the right-lower field reveals wheezing. Examination of the left-lower field reveals wheezing. Wheezing present.  Abdominal:      General: Bowel sounds are normal. There is no distension.     Palpations: Abdomen is soft. There is no hepatomegaly, splenomegaly  or mass.     Tenderness: There is no abdominal tenderness. There is no guarding or rebound.     Hernia: No hernia is present.  Musculoskeletal:     Cervical back: Neck supple.  Skin:    General: Skin is warm and dry.     Coloration: Skin is not jaundiced.     Findings: No bruising or rash.  Neurological:     General: No focal deficit present.     Mental Status: He is alert and oriented to person, place, and time. Mental status is at baseline.  Psychiatric:        Mood and Affect: Mood normal.        Behavior: Behavior normal.        Thought Content: Thought content normal.      Assessment:  Very pleasant 70 year old male presents for follow-up on abdominal pain.  His pain is about the same, and generally mild and tolerable.  Located right lower quadrant.  CT, abdominal ultrasound, colonoscopy unrevealing.  There is a sentiment of possible chronic appendicitis and recommended follow-up with the surgeon.  Upon discussing with the patient he has a surgical appointment upcoming for right inguinal hernia.  This certainly could be causing his abdominal pain release contributing to it.  I have recommended that when he sees a Psychologist, sport and exercise he discusses his right lower quadrant abdominal pain to see if they feel an exploratory laparoscopy is warranted at the time of his inguinal hernia repair.  I will have him follow-up with [place] in about 6 months just to touch base and see how his pain is doing after surgical evaluation and treatment.   Plan: 1. Continue current medications 2. Follow-up with the surgeon for inguinal hernia repair and discuss abdominal pain at that time 3. Follow-up in 6 months in our office    Thank you for allowing [place] to participate in the care of [facility]  Walden Field, DNP, AGNP-C Adult & Gerontological Nurse Practitioner [facility]   05/27/2020 3:14 PM   Disclaimer: This note was dictated with voice recognition software. Similar sounding words can inadvertently be transcribed and may not be corrected upon review.

## 2020-06-11 ENCOUNTER — Other Ambulatory Visit: Payer: Self-pay | Admitting: Cardiology

## 2020-06-18 ENCOUNTER — Ambulatory Visit: Payer: Medicare Other | Admitting: General Surgery

## 2020-06-20 DIAGNOSIS — E7849 Other hyperlipidemia: Secondary | ICD-10-CM | POA: Diagnosis not present

## 2020-06-20 DIAGNOSIS — K219 Gastro-esophageal reflux disease without esophagitis: Secondary | ICD-10-CM | POA: Diagnosis not present

## 2020-07-16 DIAGNOSIS — G894 Chronic pain syndrome: Secondary | ICD-10-CM | POA: Diagnosis not present

## 2020-07-16 DIAGNOSIS — D72829 Elevated white blood cell count, unspecified: Secondary | ICD-10-CM | POA: Diagnosis not present

## 2020-07-16 DIAGNOSIS — F330 Major depressive disorder, recurrent, mild: Secondary | ICD-10-CM | POA: Diagnosis not present

## 2020-07-16 DIAGNOSIS — Z6831 Body mass index (BMI) 31.0-31.9, adult: Secondary | ICD-10-CM | POA: Diagnosis not present

## 2020-07-16 DIAGNOSIS — J449 Chronic obstructive pulmonary disease, unspecified: Secondary | ICD-10-CM | POA: Diagnosis not present

## 2020-07-16 DIAGNOSIS — Z1389 Encounter for screening for other disorder: Secondary | ICD-10-CM | POA: Diagnosis not present

## 2020-07-27 ENCOUNTER — Other Ambulatory Visit: Payer: Self-pay | Admitting: Cardiology

## 2020-08-13 DIAGNOSIS — E6609 Other obesity due to excess calories: Secondary | ICD-10-CM | POA: Diagnosis not present

## 2020-08-13 DIAGNOSIS — G8929 Other chronic pain: Secondary | ICD-10-CM | POA: Diagnosis not present

## 2020-08-13 DIAGNOSIS — Z6831 Body mass index (BMI) 31.0-31.9, adult: Secondary | ICD-10-CM | POA: Diagnosis not present

## 2020-08-13 DIAGNOSIS — E7849 Other hyperlipidemia: Secondary | ICD-10-CM | POA: Diagnosis not present

## 2020-08-19 DIAGNOSIS — K219 Gastro-esophageal reflux disease without esophagitis: Secondary | ICD-10-CM | POA: Diagnosis not present

## 2020-08-19 DIAGNOSIS — E7849 Other hyperlipidemia: Secondary | ICD-10-CM | POA: Diagnosis not present

## 2020-08-31 DIAGNOSIS — D225 Melanocytic nevi of trunk: Secondary | ICD-10-CM | POA: Diagnosis not present

## 2020-08-31 DIAGNOSIS — D485 Neoplasm of uncertain behavior of skin: Secondary | ICD-10-CM | POA: Diagnosis not present

## 2020-08-31 DIAGNOSIS — L570 Actinic keratosis: Secondary | ICD-10-CM | POA: Diagnosis not present

## 2020-08-31 DIAGNOSIS — X32XXXD Exposure to sunlight, subsequent encounter: Secondary | ICD-10-CM | POA: Diagnosis not present

## 2020-09-17 DIAGNOSIS — D485 Neoplasm of uncertain behavior of skin: Secondary | ICD-10-CM | POA: Diagnosis not present

## 2020-10-01 DIAGNOSIS — Z6831 Body mass index (BMI) 31.0-31.9, adult: Secondary | ICD-10-CM | POA: Diagnosis not present

## 2020-10-01 DIAGNOSIS — G8929 Other chronic pain: Secondary | ICD-10-CM | POA: Diagnosis not present

## 2020-10-01 DIAGNOSIS — F1120 Opioid dependence, uncomplicated: Secondary | ICD-10-CM | POA: Diagnosis not present

## 2020-10-01 DIAGNOSIS — E6609 Other obesity due to excess calories: Secondary | ICD-10-CM | POA: Diagnosis not present

## 2020-10-20 DIAGNOSIS — K219 Gastro-esophageal reflux disease without esophagitis: Secondary | ICD-10-CM | POA: Diagnosis not present

## 2020-10-20 DIAGNOSIS — E7849 Other hyperlipidemia: Secondary | ICD-10-CM | POA: Diagnosis not present

## 2020-10-21 DIAGNOSIS — D72829 Elevated white blood cell count, unspecified: Secondary | ICD-10-CM | POA: Diagnosis not present

## 2020-11-24 ENCOUNTER — Encounter: Payer: Self-pay | Admitting: Gastroenterology

## 2020-11-24 ENCOUNTER — Encounter: Payer: Self-pay | Admitting: Internal Medicine

## 2020-11-24 ENCOUNTER — Other Ambulatory Visit: Payer: Self-pay

## 2020-11-24 ENCOUNTER — Telehealth: Payer: Self-pay

## 2020-11-24 ENCOUNTER — Ambulatory Visit: Payer: Medicare Other | Admitting: Gastroenterology

## 2020-11-24 ENCOUNTER — Ambulatory Visit: Payer: Medicare Other | Admitting: Nurse Practitioner

## 2020-11-24 VITALS — BP 127/84 | HR 62 | Temp 97.8°F | Ht 65.0 in | Wt 192.4 lb

## 2020-11-24 DIAGNOSIS — R109 Unspecified abdominal pain: Secondary | ICD-10-CM | POA: Diagnosis not present

## 2020-11-24 DIAGNOSIS — R1013 Epigastric pain: Secondary | ICD-10-CM | POA: Diagnosis not present

## 2020-11-24 DIAGNOSIS — K219 Gastro-esophageal reflux disease without esophagitis: Secondary | ICD-10-CM

## 2020-11-24 NOTE — Telephone Encounter (Signed)
Will call pt to scheduled EGD/-/+DIL w/Propofol ASA 2 with Dr. Gala Romney when future schedule is available.

## 2020-11-24 NOTE — Progress Notes (Signed)
Referring Provider: Redmond School, MD Primary Care Physician:  Redmond School, MD Primary GI Physician: [place]  Chief Complaint  Patient presents with   Gastroesophageal Reflux    Doing ok as long as takes med   HPI:   ELLIJAH LEFFEL is a 70 y.o. male presenting today with a history of abdominal pain and GERD and new symptoms of epigastric pain.   GERD: Prilosec 20mg  Daily. Doing well on this. States he occasionally misses a dose and has symptoms but otherwise well controlled on medication without breakthrough reflux.  Abdominal Pain: RLQ. Ongoing, CT 02/21/20 was unremarkable, Colonoscopy 03/2020 did not reveal significant findings to explain pain, however chronic appendicitis was queried. States that he has some intermittent pain occasionally.  Denies any precipitating factors. Denies n/v/d or constipation, no melena or hematochezia.  Epigastric Pain: Does endorses some burning, epigastric pain, but states not when eating. States that it occurs 1-2x week. Episodes last 15-30 mins. Also endorses some early satiety 3-4x/month.  Last Colonoscopy:04/09/20 normal colon. Query chronic appendicitis as etiology of ongoing RLQ abdominal pain.  Last Endoscopy:12/28/10 normal appearing tubular esophagus, prominent vascular pattern in stomach. Patchy erythema and erosions. No ulcer or infiltrating process. Patent pylorus. D1 and D2 appeared normal.  Recommendations:  Repeat colonoscopy 7 years   Past Medical History:  Diagnosis Date   Allergic rhinitis    Anxiety and depression    Asthma    CAD (coronary artery disease)    Mild to moderate nonobstructive LAD disease 2006   Chronic low back pain    COPD (chronic obstructive pulmonary disease) (HCC)    Dyslipidemia    Essential hypertension    GERD (gastroesophageal reflux disease)    Hearing loss    Hemorrhoids    Hyperplastic colon polyp 12/28/10   IBS (irritable bowel syndrome)    Langerhan's cell histiocytosis (HCC)    Mitral  regurgitation    OSA on CPAP    Tubular adenoma 12/28/10    Past Surgical History:  Procedure Laterality Date   CHOLECYSTECTOMY     COLONOSCOPY  2007   friable anal canal, hyperplastic polyp   COLONOSCOPY  12/28/2010   Dr. Gala Romney- tubular adenoma, hyperplastic polyp   COLONOSCOPY N/A 01/16/2015   Rourk: anal canal and internal hemorrhois. next surveillance colonoscopy 12/2019   COLONOSCOPY WITH PROPOFOL N/A 04/09/2020   Procedure: COLONOSCOPY WITH PROPOFOL;  Surgeon: Daneil Dolin, MD;  Location: [facility];  Service: Endoscopy;  Laterality: N/A;  11:15am   EAR PINNA RECONSTRUCTION W/ RIB GRAFT     right   ESOPHAGOGASTRODUODENOSCOPY     multiple dilations, last EGD 2007 showed small hh, adenomatous appearing gastric mucosa in body but biopsies benign. SB bx negative for Celiac.   ESOPHAGOGASTRODUODENOSCOPY  12/28/2010   Dr. Gala Romney- normal esophagus s/p dilationpatchy erythema and erosions.   FOOT SURGERY     right   INGUINAL HERNIA REPAIR     left   INGUINAL HERNIA REPAIR Left 07/20/2018   Procedure: LEFT INDIRECT INGUINAL AND FEMORAL HERNIA REPAIR WITH MESH;  Surgeon: Virl Cagey, MD;  Location: [facility];  Service: General;  Laterality: Left;   LUNG BIOPSY  2009   right   MALONEY DILATION  12/28/2010   Procedure: MALONEY DILATION;  Surgeon: Daneil Dolin, MD;  Location: [facility];  Service: Endoscopy;  Laterality: N/A;   TONSILLECTOMY     TRANSTHORACIC ECHOCARDIOGRAM  02/2011   EF =>55%, normal chamber size & function; mild mitral and aortic  insuff, mild-mod tricuspid insuff; mild pulm htn with rsvp of 45mmHg    Current Outpatient Medications  Medication Sig Dispense Refill   albuterol (VENTOLIN HFA) 108 (90 Base) MCG/ACT inhaler Inhale 2 puffs into the lungs every 6 (six) hours as needed for wheezing or shortness of breath. 6.7 g 6   ANORO ELLIPTA 62.5-25 MCG/INH AEPB Inhale 1 puff by mouth once daily (Patient taking differently: Inhale 1 puff into the lungs daily.) 60  each 0   aspirin 81 MG tablet Take 81 mg by mouth daily.     fenofibrate (TRICOR) 145 MG tablet Take 145 mg by mouth at bedtime.     fish oil-omega-3 fatty acids 1000 MG capsule Take 1 g by mouth daily.     fluticasone (FLONASE) 50 MCG/ACT nasal spray Place 2 sprays into both nostrils daily as needed for allergies.     HYDROcodone-acetaminophen (NORCO) 10-325 MG per tablet Take 0.5-1 tablets by mouth 3 (three) times daily as needed for moderate pain.     losartan (COZAAR) 100 MG tablet Take 1 tablet by mouth once daily 90 tablet 1   Multiple Vitamin (MULTIVITAMIN) capsule Take 1 capsule by mouth daily.     nitroGLYCERIN (NITROSTAT) 0.4 MG SL tablet Place 1 tablet (0.4 mg total) under the tongue every 5 (five) minutes as needed for chest pain. 25 tablet 3   omeprazole (PRILOSEC) 20 MG capsule Take 20 mg by mouth daily.   2   simvastatin (ZOCOR) 20 MG tablet TAKE 1 TABLET BY MOUTH AT BEDTIME . APPOINTMENT REQUIRED FOR FUTURE REFILLS 15 tablet 0   No current facility-administered medications for this visit.    Allergies as of 11/24/2020 - Review Complete 11/24/2020  Allergen Reaction Noted   Bee venom Swelling 12/28/2010    Family History  Problem Relation Age of Onset   Heart disease Father 30       etoh/breathing problmes   COPD Father    Heart attack Maternal Grandfather    Diabetes Paternal Grandmother    COPD Paternal Grandmother    Heart Problems Paternal Grandfather    GI problems Mother    Hyperlipidemia Mother    COPD Mother    Heart attack Maternal Grandmother        also valvular problems   Heart attack Brother        stenting x3, also cancer   Skin cancer Brother    Hypertension Brother    Hypertension Sister    Colon cancer Neg Hx    Liver disease Neg Hx    Inflammatory bowel disease Neg Hx     Social History   Socioeconomic History   Marital status: Married    Spouse name: Not on file   Number of children: 1   Years of education: Not on file   Highest  education level: Not on file  Occupational History   Occupation: disability    Employer: UNEMPLOYED  Tobacco Use   Smoking status: Every Day    Packs/day: 0.50    Years: 37.00    Pack years: 18.50    Types: Cigarettes    Start date: 05/23/1978   Smokeless tobacco: Never   Tobacco comments:    down to 0.25ppd  Vaping Use   Vaping Use: Some days  Substance and Sexual Activity   Alcohol use: No    Alcohol/week: 0.0 standard drinks   Drug use: No   Sexual activity: Not on file  Other Topics Concern   Not on file  Social History Narrative   Son, age 64, hit by truck.   Drinks about 2-3 cups of coffee a day. Occasionally drinks tea.    Social Determinants of Health   Financial Resource Strain: Not on file  Food Insecurity: Not on file  Transportation Needs: Not on file  Physical Activity: Not on file  Stress: Not on file  Social Connections: Not on file    Review of Systems: Gen: Denies fever, chills, anorexia. Denies fatigue, weakness, weight loss.  CV: Denies chest pain, palpitations, syncope, peripheral edema, and claudication. Resp: Denies dyspnea at rest, cough, wheezing, coughing up blood, and pleurisy. GI: Denies vomiting blood, jaundice, and fecal incontinence. Denies dysphagia or odynophagia. RLQ pain that is ongoing as well as new Epigastric pain. Derm: Denies rash, itching, dry skin Psych: Denies depression, anxiety, memory loss, confusion. No homicidal or suicidal ideation.  Heme: Denies bruising, bleeding, and enlarged lymph nodes.  Physical Exam: BP 127/84   Pulse 62   Temp 97.8 F (36.6 C) (Temporal)   Ht 5\' 5"  (1.651 m)   Wt 192 lb 6.4 oz (87.3 kg)   BMI 32.02 kg/m  General:   Alert and oriented. No distress noted. Pleasant and cooperative.  Heart: Normal rate and rhythm, s1 and s2 heart sounds present.  Lungs: Clear lung sounds in all lobes. Respirations equal and unlabored. Abdomen:  +BS, soft, non-distended, TTP RLQ, Ongoing. No rebound or  guarding. No HSM or masses noted. Derm: No palmar erythema or jaundice Msk:  Symmetrical without gross deformities. Normal posture. Extremities:  Without edema. Neurologic:  Alert and  oriented x4 Psych:  Alert and cooperative. Normal mood and affect.  ASSESSMENT: GIOVONI BUNCH is a 70 y.o. male presenting today with history of GERD, RLQ abdominal pain and new symptoms of epigastric pain.  GERD is well controlled on Omeprazole 20mg  daily without breakthrough symptoms. No dysphagia or odynophagia  RLQ Abdominal pain is ongoing, has had workup for symptoms previously to include CT abdomen without significant findings and Colonoscopy fairly unremarkable, queried possible chronic appendicitis etiology of symptoms.   Patient endorses some epigastric pain that occurs without precipitating factors, not related to eating. States that pain lasts from 15-30 minutes each episode and occurs 1-2x/week as well as some early satiety 3-4x/month, however, he states that he eats very fast and questions if this could be the cause of early satiety. Denies melena or hematochezia, no nausea or vomiting. Differentials include gastritis, unable to r/o peptic ulcer disease, less likely malignancy. Recommend EGD for further evaluation of symptoms.   PLAN:  Continue omeprazole 20mg  daily with good result 2. Schedule EGD with propofol with Dr. Gala Romney r/t epigastric pain and occasional early satiety. Risks and benefits discussed with patient with stated understanding.  3. Attempt to eat meals slower to avoid early satiety.  <ASA II> Not currently on anticoagulants, antiplatelet therapy, antidiabetic agents or full dose aspirin.   Follow Up: 6 months  Chelsea L. Alver Sorrow, MSN, APRN, AGNP-C Adult-Gerontology Nurse Practitioner [facility]  Addendum: patient seen in conjunction with Scherrie Gerlach, MSN, APRN, AGNP-C. Regarding RLQ pain, this has dated back almost 10 years. Appendix normal Oct  2021. Epigastric pain is new. Consider CT if no findings on EGD. Gallbladder absent. Weight fluctuating and no significant weight loss. 81 mg aspirin daily, and he denies any other significant NSAID use. EGD as planned.   Annitta Needs, PhD, ANP-BC [facility]

## 2020-11-24 NOTE — Patient Instructions (Addendum)
Continue Omeprazole daily. We will schedule an EGD with Dr. Gala Romney for upper abdominal pain. Recommend having this done within the next month.  Follow up in 6 months  It was a pleasure caring for you today!

## 2020-11-27 DIAGNOSIS — Z6831 Body mass index (BMI) 31.0-31.9, adult: Secondary | ICD-10-CM | POA: Diagnosis not present

## 2020-11-27 DIAGNOSIS — J449 Chronic obstructive pulmonary disease, unspecified: Secondary | ICD-10-CM | POA: Diagnosis not present

## 2020-11-27 DIAGNOSIS — G8929 Other chronic pain: Secondary | ICD-10-CM | POA: Diagnosis not present

## 2020-11-27 DIAGNOSIS — E6609 Other obesity due to excess calories: Secondary | ICD-10-CM | POA: Diagnosis not present

## 2020-12-09 DIAGNOSIS — J449 Chronic obstructive pulmonary disease, unspecified: Secondary | ICD-10-CM | POA: Diagnosis not present

## 2020-12-09 DIAGNOSIS — T50905A Adverse effect of unspecified drugs, medicaments and biological substances, initial encounter: Secondary | ICD-10-CM | POA: Diagnosis not present

## 2020-12-09 DIAGNOSIS — E6609 Other obesity due to excess calories: Secondary | ICD-10-CM | POA: Diagnosis not present

## 2020-12-09 DIAGNOSIS — Z6830 Body mass index (BMI) 30.0-30.9, adult: Secondary | ICD-10-CM | POA: Diagnosis not present

## 2020-12-18 NOTE — Telephone Encounter (Signed)
Called pt. He has been scheduled for 9/26 at 8:15am. Aware will mail prep instructions to him

## 2020-12-25 ENCOUNTER — Other Ambulatory Visit: Payer: Self-pay

## 2020-12-25 ENCOUNTER — Ambulatory Visit (HOSPITAL_COMMUNITY)
Admission: RE | Admit: 2020-12-25 | Discharge: 2020-12-25 | Disposition: A | Discharge status: Home / Self Care | Payer: Medicare Other | Source: Ambulatory Visit | Attending: Acute Care | Admitting: Acute Care

## 2020-12-25 DIAGNOSIS — F17210 Nicotine dependence, cigarettes, uncomplicated: Secondary | ICD-10-CM | POA: Diagnosis not present

## 2020-12-25 DIAGNOSIS — J449 Chronic obstructive pulmonary disease, unspecified: Secondary | ICD-10-CM | POA: Diagnosis not present

## 2020-12-25 DIAGNOSIS — G4730 Sleep apnea, unspecified: Secondary | ICD-10-CM | POA: Diagnosis not present

## 2020-12-25 DIAGNOSIS — Z0001 Encounter for general adult medical examination with abnormal findings: Secondary | ICD-10-CM | POA: Diagnosis not present

## 2020-12-25 DIAGNOSIS — F330 Major depressive disorder, recurrent, mild: Secondary | ICD-10-CM | POA: Diagnosis not present

## 2021-01-07 NOTE — Progress Notes (Signed)
Please call patient and let them  know their  low dose Ct was read as a Lung RADS 2: nodules that are benign in appearance and behavior with a very low likelihood of becoming a clinically active cancer due to size or lack of growth. Recommendation per radiology is for a repeat LDCT in 12 months. .Please let them  know we will order and schedule their  annual screening scan for 12/2021. Please let them  know there was notation of CAD on their  scan.  Please remind the patient  that this is a non-gated exam therefore degree or severity of disease  cannot be determined. Please have them  follow up with their PCP regarding potential risk factor modification, dietary therapy or pharmacologic therapy if clinically indicated. Pt.  is  currently on statin therapy. Please place order for annual  screening scan for  12/2021 and fax results to PCP. Thanks so much.  Pt. Is followed by cards

## 2021-01-08 DIAGNOSIS — G8929 Other chronic pain: Secondary | ICD-10-CM | POA: Diagnosis not present

## 2021-01-08 DIAGNOSIS — U071 COVID-19: Secondary | ICD-10-CM | POA: Diagnosis not present

## 2021-01-11 ENCOUNTER — Other Ambulatory Visit: Payer: Self-pay | Admitting: *Deleted

## 2021-01-11 DIAGNOSIS — F17210 Nicotine dependence, cigarettes, uncomplicated: Secondary | ICD-10-CM

## 2021-01-11 DIAGNOSIS — Z87891 Personal history of nicotine dependence: Secondary | ICD-10-CM

## 2021-02-08 ENCOUNTER — Telehealth: Payer: Self-pay | Admitting: Internal Medicine

## 2021-02-08 NOTE — Telephone Encounter (Signed)
Pt needs to reschedule his colonoscopy that's on 9/26. [phone]

## 2021-02-09 NOTE — Telephone Encounter (Signed)
Called pt, he wants to reschedule EGD/-/+DIL d/t he will be taking his mother home from nursing home. Prefers to wait until November schedule is available to reschedule. Endo scheduler informed.

## 2021-02-15 ENCOUNTER — Ambulatory Visit (HOSPITAL_COMMUNITY): Admission: RE | Admit: 2021-02-15 | Payer: Medicare Other | Source: Home / Self Care | Admitting: Internal Medicine

## 2021-02-15 ENCOUNTER — Encounter (HOSPITAL_COMMUNITY): Admission: RE | Payer: Self-pay | Source: Home / Self Care

## 2021-02-15 SURGERY — ESOPHAGOGASTRODUODENOSCOPY (EGD) WITH PROPOFOL
Anesthesia: Monitor Anesthesia Care

## 2021-03-01 DIAGNOSIS — S335XXA Sprain of ligaments of lumbar spine, initial encounter: Secondary | ICD-10-CM | POA: Diagnosis not present

## 2021-03-01 DIAGNOSIS — Z6830 Body mass index (BMI) 30.0-30.9, adult: Secondary | ICD-10-CM | POA: Diagnosis not present

## 2021-03-01 DIAGNOSIS — E6609 Other obesity due to excess calories: Secondary | ICD-10-CM | POA: Diagnosis not present

## 2021-03-03 NOTE — Telephone Encounter (Signed)
Called pt to reschedule. He states he can't right now. He didn't know when he could reschedule. He will call back when ready

## 2021-03-19 DIAGNOSIS — I10 Essential (primary) hypertension: Secondary | ICD-10-CM | POA: Diagnosis not present

## 2021-03-19 DIAGNOSIS — Z6830 Body mass index (BMI) 30.0-30.9, adult: Secondary | ICD-10-CM | POA: Diagnosis not present

## 2021-03-19 DIAGNOSIS — G8929 Other chronic pain: Secondary | ICD-10-CM | POA: Diagnosis not present

## 2021-03-19 DIAGNOSIS — F330 Major depressive disorder, recurrent, mild: Secondary | ICD-10-CM | POA: Diagnosis not present

## 2021-04-20 ENCOUNTER — Ambulatory Visit (HOSPITAL_COMMUNITY)
Admission: RE | Admit: 2021-04-20 | Discharge: 2021-04-20 | Disposition: A | Discharge status: Home / Self Care | Payer: Medicare Other | Source: Ambulatory Visit | Attending: Internal Medicine | Admitting: Internal Medicine

## 2021-04-20 ENCOUNTER — Other Ambulatory Visit: Payer: Self-pay

## 2021-04-20 ENCOUNTER — Other Ambulatory Visit (HOSPITAL_COMMUNITY): Payer: Self-pay | Admitting: Internal Medicine

## 2021-04-20 DIAGNOSIS — I10 Essential (primary) hypertension: Secondary | ICD-10-CM | POA: Diagnosis not present

## 2021-04-20 DIAGNOSIS — M4696 Unspecified inflammatory spondylopathy, lumbar region: Secondary | ICD-10-CM | POA: Diagnosis not present

## 2021-04-20 DIAGNOSIS — S335XXA Sprain of ligaments of lumbar spine, initial encounter: Secondary | ICD-10-CM | POA: Diagnosis not present

## 2021-04-20 DIAGNOSIS — G894 Chronic pain syndrome: Secondary | ICD-10-CM | POA: Diagnosis not present

## 2021-04-20 DIAGNOSIS — J449 Chronic obstructive pulmonary disease, unspecified: Secondary | ICD-10-CM | POA: Diagnosis not present

## 2021-04-20 DIAGNOSIS — M4316 Spondylolisthesis, lumbar region: Secondary | ICD-10-CM | POA: Diagnosis not present

## 2021-04-20 DIAGNOSIS — M47816 Spondylosis without myelopathy or radiculopathy, lumbar region: Secondary | ICD-10-CM | POA: Diagnosis not present

## 2021-05-19 DIAGNOSIS — E669 Obesity, unspecified: Secondary | ICD-10-CM | POA: Diagnosis not present

## 2021-05-19 DIAGNOSIS — Z6830 Body mass index (BMI) 30.0-30.9, adult: Secondary | ICD-10-CM | POA: Diagnosis not present

## 2021-05-19 DIAGNOSIS — M75102 Unspecified rotator cuff tear or rupture of left shoulder, not specified as traumatic: Secondary | ICD-10-CM | POA: Diagnosis not present

## 2021-06-01 DIAGNOSIS — Z1389 Encounter for screening for other disorder: Secondary | ICD-10-CM | POA: Diagnosis not present

## 2021-06-01 DIAGNOSIS — Z6830 Body mass index (BMI) 30.0-30.9, adult: Secondary | ICD-10-CM | POA: Diagnosis not present

## 2021-06-01 DIAGNOSIS — J329 Chronic sinusitis, unspecified: Secondary | ICD-10-CM | POA: Diagnosis not present

## 2021-06-01 DIAGNOSIS — J449 Chronic obstructive pulmonary disease, unspecified: Secondary | ICD-10-CM | POA: Diagnosis not present

## 2021-06-01 DIAGNOSIS — E6609 Other obesity due to excess calories: Secondary | ICD-10-CM | POA: Diagnosis not present

## 2021-06-01 DIAGNOSIS — G894 Chronic pain syndrome: Secondary | ICD-10-CM | POA: Diagnosis not present

## 2021-06-01 DIAGNOSIS — I10 Essential (primary) hypertension: Secondary | ICD-10-CM | POA: Diagnosis not present

## 2021-06-02 ENCOUNTER — Ambulatory Visit: Payer: Medicare Other | Admitting: Gastroenterology

## 2021-06-17 ENCOUNTER — Other Ambulatory Visit: Payer: Self-pay

## 2021-06-17 ENCOUNTER — Ambulatory Visit: Payer: Medicare Other | Admitting: Urology

## 2021-06-17 ENCOUNTER — Encounter: Payer: Self-pay | Admitting: Urology

## 2021-06-17 VITALS — BP 151/77 | HR 88

## 2021-06-17 DIAGNOSIS — N529 Male erectile dysfunction, unspecified: Secondary | ICD-10-CM | POA: Insufficient documentation

## 2021-06-17 MED ORDER — TADALAFIL 20 MG PO TABS: 20.0000 mg | ORAL_TABLET | Freq: Every day | ORAL | 11 refills | Status: AC | PRN

## 2021-06-17 NOTE — Progress Notes (Signed)
Urological Symptom Review  Patient is experiencing the following symptoms: Get up at night to urinate Trouble starting stream Erection problems (male only)   Review of Systems  Gastrointestinal (upper)  : Indigestion/heartburn  Gastrointestinal (lower) : Negative for lower GI symptoms  Constitutional : Night Sweats Fatigue  Skin: Negative for skin symptoms  Eyes: Negative for eye symptoms  Ear/Nose/Throat : Sinus problems  Hematologic/Lymphatic: Negative for Hematologic/Lymphatic symptoms  Cardiovascular : Negative for cardiovascular symptoms  Respiratory : Shortness of breath  Endocrine: Negative for endocrine symptoms  Musculoskeletal: Back pain  Neurological: Negative for neurological symptoms  Psychologic: Negative for psychiatric symptoms

## 2021-06-17 NOTE — Progress Notes (Signed)
Assessment: 1. Organic impotence     Plan: Today I had a long discussion with the patient spending a total of 20 minutes discussing ED.  I discussed the pathophysiology, etiology, and natural history of ED as well as management options using a goal-oriented approach.  We discussed the following options: Medical therapy, vacuum erection device, penile injections, intraurethral suppository therapy (MUSE), and penile prosthesis. Patient educational materials concerning these options were given to the patient.  I advised him that erectile dysfunction after hernia repair is unusual. Trial of tadalafil 20 mg prn.  Rx provided.  Use and side effects discussed. Patient instructed that he should not use nitroglycerin in combination with tadalafil. Return to office in 6 weeks   Chief Complaint:  Chief Complaint  Patient presents with   Erectile Dysfunction    History of Present Illness:  [name] is a 71 y.o. year old male who is seen in consultation from Redmond School, MD for evaluation of erectile dysfunction.  He has had symptoms for approximately 2 years.  He reports onset of symptoms after a left inguinal hernia repair.  He is able to achieve a partial erection with 30-40% rigidity.  Due to the decreased rigidity, he is unable to have intercourse.  He is able to ejaculate with some difficulty.  No pain or curvature with erections.  No early morning or nocturnal erections.  No decrease in his libido. He has been using sildenafil 100 mg as needed without improvement.  He has not tried any other therapy.  Risk factors for ED include CAD, hypertension, hypercholesterolemia, tobacco use.  He also reports a history of chronic low back pain and takes pain medication on a daily basis.  He has nocturia x1, occasional frequency and urgency.  No dysuria or gross hematuria. IPSS = 5 today.  He has a prescription for nitroglycerin but has never used the medication.   Past Medical  History:  Past Medical History:  Diagnosis Date   Allergic rhinitis    Anxiety and depression    Asthma    CAD (coronary artery disease)    Mild to moderate nonobstructive LAD disease 2006   Chronic low back pain    COPD (chronic obstructive pulmonary disease) (HCC)    Dyslipidemia    Essential hypertension    GERD (gastroesophageal reflux disease)    Hearing loss    Hemorrhoids    Hyperplastic colon polyp 12/28/10   IBS (irritable bowel syndrome)    Langerhan's cell histiocytosis (HCC)    Mitral regurgitation    OSA on CPAP    Tubular adenoma 12/28/10    Past Surgical History:  Past Surgical History:  Procedure Laterality Date   CHOLECYSTECTOMY     COLONOSCOPY  05/23/2005   friable anal canal, hyperplastic polyp   COLONOSCOPY  12/28/2010   Dr. Gala Romney- tubular adenoma, hyperplastic polyp   COLONOSCOPY N/A 01/16/2015   Rourk: anal canal and internal hemorrhois. next surveillance colonoscopy 12/2019   COLONOSCOPY WITH PROPOFOL N/A 04/09/2020   Rourk-normal, query chronic appendicitis as cause of ongoing RLQ abd pain   EAR PINNA RECONSTRUCTION W/ RIB GRAFT     right   ESOPHAGOGASTRODUODENOSCOPY     multiple dilations, last EGD 2007 showed small hh, adenomatous appearing gastric mucosa in body but biopsies benign. SB bx negative for Celiac.   ESOPHAGOGASTRODUODENOSCOPY  12/28/2010   Dr. Gala Romney- normal esophagus s/p dilationpatchy erythema and erosions.   FOOT SURGERY     right   INGUINAL HERNIA REPAIR  left   INGUINAL HERNIA REPAIR Left 07/20/2018   Procedure: LEFT INDIRECT INGUINAL AND FEMORAL HERNIA REPAIR WITH MESH;  Surgeon: Virl Cagey, MD;  Location: [facility];  Service: General;  Laterality: Left;   LUNG BIOPSY  05/24/2007   right   MALONEY DILATION  12/28/2010   Procedure: Venia Minks DILATION;  Surgeon: Daneil Dolin, MD;  Location: [facility];  Service: Endoscopy;  Laterality: N/A;   TONSILLECTOMY     TRANSTHORACIC ECHOCARDIOGRAM  02/21/2011   EF =>55%,  normal chamber size & function; mild mitral and aortic insuff, mild-mod tricuspid insuff; mild pulm htn with rsvp of 16mmHg    Allergies:  Allergies  Allergen Reactions   Bee Venom Swelling    Family History:  Family History  Problem Relation Age of Onset   Heart disease Father 38       etoh/breathing problmes   COPD Father    Heart attack Maternal Grandfather    Diabetes Paternal Grandmother    COPD Paternal Grandmother    Heart Problems Paternal Grandfather    GI problems Mother    Hyperlipidemia Mother    COPD Mother    Heart attack Maternal Grandmother        also valvular problems   Heart attack Brother        stenting x3, also cancer   Skin cancer Brother    Hypertension Brother    Hypertension Sister    Colon cancer Neg Hx    Liver disease Neg Hx    Inflammatory bowel disease Neg Hx     Social History:  Social History   Tobacco Use   Smoking status: Every Day    Packs/day: 0.50    Years: 37.00    Pack years: 18.50    Types: Cigarettes    Start date: 05/23/1978   Smokeless tobacco: Never   Tobacco comments:    down to 0.25ppd  Vaping Use   Vaping Use: Some days  Substance Use Topics   Alcohol use: No    Alcohol/week: 0.0 standard drinks   Drug use: No    Review of symptoms:  Constitutional:  Negative for unexplained weight loss, night sweats, fever, chills ENT:  Negative for nose bleeds, sinus pain, painful swallowing CV:  Negative for chest pain, shortness of breath, exercise intolerance, palpitations, loss of consciousness Resp:  Negative for cough, wheezing, shortness of breath GI:  Negative for nausea, vomiting, diarrhea, bloody stools GU:  Positives noted in HPI; otherwise negative for gross hematuria, dysuria, urinary incontinence Neuro:  Negative for seizures, poor balance, limb weakness, slurred speech Psych:  Negative for lack of energy, depression, anxiety Endocrine:  Negative for polydipsia, polyuria, symptoms of hypoglycemia (dizziness,  hunger, sweating) Hematologic:  Negative for anemia, purpura, petechia, prolonged or excessive bleeding, use of anticoagulants  Allergic:  Negative for difficulty breathing or choking as a result of exposure to anything; no shellfish allergy; no allergic response (rash/itch) to materials, foods  Physical exam: BP (!) 151/77    Pulse 88  GENERAL APPEARANCE:  Well appearing, well developed, well nourished, NAD HEENT: Atraumatic, Normocephalic, oropharynx clear. NECK: Supple without lymphadenopathy or thyromegaly. LUNGS: Clear to auscultation bilaterally. HEART: Regular Rate and Rhythm without murmurs, gallops, or rubs. ABDOMEN: Soft, non-tender, No Masses. EXTREMITIES: Moves all extremities well.  Without clubbing, cyanosis, or edema. NEUROLOGIC:  Alert and oriented x 3, normal gait, CN II-XII grossly intact.  MENTAL STATUS:  Appropriate. BACK:  Non-tender to palpation.  No CVAT SKIN:  Warm, dry  and intact.   GU: Penis:  circumcised Meatus: Normal Scrotum: normal, no masses Testis: normal without masses bilateral  Results: U/A dipstick negative

## 2021-07-06 DIAGNOSIS — G894 Chronic pain syndrome: Secondary | ICD-10-CM | POA: Diagnosis not present

## 2021-07-06 DIAGNOSIS — J329 Chronic sinusitis, unspecified: Secondary | ICD-10-CM | POA: Diagnosis not present

## 2021-07-06 DIAGNOSIS — G8929 Other chronic pain: Secondary | ICD-10-CM | POA: Diagnosis not present

## 2021-07-06 DIAGNOSIS — M12812 Other specific arthropathies, not elsewhere classified, left shoulder: Secondary | ICD-10-CM | POA: Diagnosis not present

## 2021-07-12 DIAGNOSIS — E782 Mixed hyperlipidemia: Secondary | ICD-10-CM | POA: Diagnosis not present

## 2021-07-12 DIAGNOSIS — I10 Essential (primary) hypertension: Secondary | ICD-10-CM | POA: Diagnosis not present

## 2021-07-12 DIAGNOSIS — G894 Chronic pain syndrome: Secondary | ICD-10-CM | POA: Diagnosis not present

## 2021-07-12 DIAGNOSIS — Z6830 Body mass index (BMI) 30.0-30.9, adult: Secondary | ICD-10-CM | POA: Diagnosis not present

## 2021-07-29 ENCOUNTER — Ambulatory Visit: Payer: Medicare Other | Admitting: Urology

## 2021-08-23 DIAGNOSIS — I10 Essential (primary) hypertension: Secondary | ICD-10-CM | POA: Diagnosis not present

## 2021-08-23 DIAGNOSIS — G894 Chronic pain syndrome: Secondary | ICD-10-CM | POA: Diagnosis not present

## 2021-08-23 DIAGNOSIS — F419 Anxiety disorder, unspecified: Secondary | ICD-10-CM | POA: Diagnosis not present

## 2021-08-23 DIAGNOSIS — Z6831 Body mass index (BMI) 31.0-31.9, adult: Secondary | ICD-10-CM | POA: Diagnosis not present

## 2021-09-09 DIAGNOSIS — S335XXA Sprain of ligaments of lumbar spine, initial encounter: Secondary | ICD-10-CM | POA: Diagnosis not present

## 2021-09-09 DIAGNOSIS — E6609 Other obesity due to excess calories: Secondary | ICD-10-CM | POA: Diagnosis not present

## 2021-09-09 DIAGNOSIS — I10 Essential (primary) hypertension: Secondary | ICD-10-CM | POA: Diagnosis not present

## 2021-09-09 DIAGNOSIS — J449 Chronic obstructive pulmonary disease, unspecified: Secondary | ICD-10-CM | POA: Diagnosis not present

## 2021-10-28 DIAGNOSIS — I700 Atherosclerosis of aorta: Secondary | ICD-10-CM | POA: Diagnosis not present

## 2021-10-28 DIAGNOSIS — J40 Bronchitis, not specified as acute or chronic: Secondary | ICD-10-CM | POA: Diagnosis not present

## 2021-10-28 DIAGNOSIS — G894 Chronic pain syndrome: Secondary | ICD-10-CM | POA: Diagnosis not present

## 2021-10-28 DIAGNOSIS — F419 Anxiety disorder, unspecified: Secondary | ICD-10-CM | POA: Diagnosis not present

## 2021-11-04 DIAGNOSIS — L920 Granuloma annulare: Secondary | ICD-10-CM | POA: Diagnosis not present

## 2021-11-04 DIAGNOSIS — D225 Melanocytic nevi of trunk: Secondary | ICD-10-CM | POA: Diagnosis not present

## 2021-11-04 DIAGNOSIS — L814 Other melanin hyperpigmentation: Secondary | ICD-10-CM | POA: Diagnosis not present

## 2021-11-04 DIAGNOSIS — L0212 Furuncle of neck: Secondary | ICD-10-CM | POA: Diagnosis not present

## 2021-11-04 DIAGNOSIS — B9689 Other specified bacterial agents as the cause of diseases classified elsewhere: Secondary | ICD-10-CM | POA: Diagnosis not present

## 2021-11-04 DIAGNOSIS — L570 Actinic keratosis: Secondary | ICD-10-CM | POA: Diagnosis not present

## 2021-12-01 DIAGNOSIS — M5416 Radiculopathy, lumbar region: Secondary | ICD-10-CM | POA: Diagnosis not present

## 2021-12-01 DIAGNOSIS — J449 Chronic obstructive pulmonary disease, unspecified: Secondary | ICD-10-CM | POA: Diagnosis not present

## 2021-12-01 DIAGNOSIS — I10 Essential (primary) hypertension: Secondary | ICD-10-CM | POA: Diagnosis not present

## 2021-12-01 DIAGNOSIS — G894 Chronic pain syndrome: Secondary | ICD-10-CM | POA: Diagnosis not present

## 2021-12-24 ENCOUNTER — Other Ambulatory Visit: Payer: Self-pay | Admitting: Internal Medicine

## 2021-12-24 ENCOUNTER — Other Ambulatory Visit (HOSPITAL_COMMUNITY): Payer: Self-pay | Admitting: Internal Medicine

## 2021-12-24 DIAGNOSIS — M5416 Radiculopathy, lumbar region: Secondary | ICD-10-CM | POA: Diagnosis not present

## 2021-12-27 ENCOUNTER — Ambulatory Visit (HOSPITAL_COMMUNITY): Payer: Medicare Other

## 2021-12-27 ENCOUNTER — Ambulatory Visit (HOSPITAL_COMMUNITY)
Admission: RE | Admit: 2021-12-27 | Discharge: 2021-12-27 | Disposition: A | Discharge status: Home / Self Care | Payer: Medicare Other | Source: Ambulatory Visit | Attending: Internal Medicine | Admitting: Internal Medicine

## 2021-12-27 ENCOUNTER — Telehealth: Payer: Self-pay

## 2021-12-27 DIAGNOSIS — Z87891 Personal history of nicotine dependence: Secondary | ICD-10-CM | POA: Insufficient documentation

## 2021-12-27 DIAGNOSIS — F17210 Nicotine dependence, cigarettes, uncomplicated: Secondary | ICD-10-CM | POA: Diagnosis not present

## 2021-12-27 NOTE — Telephone Encounter (Signed)
Call report: IMPRESSION: 1. Lung-RADS 4A, suspicious. Follow up low-dose chest CT without contrast in 3 months (please use the following order, "CT CHEST LCS NODULE FOLLOW-UP W/O CM") is recommended. Innumerable bilateral pulmonary nodules, including multiple new nodules as detailed above. Given multiplicity, these may all be inflammatory. 2. Aortic atherosclerosis (ICD10-I70.0), coronary artery atherosclerosis and emphysema (ICD10-J43.9). 3. Apparent gastric wall thickening could be due to underdistention. Correlate with symptoms of gastritis.   These results will be called to the ordering clinician or representative by the Radiologist Assistant, and communication documented in the PACS or [employer].    Sarah please advise

## 2021-12-29 ENCOUNTER — Telehealth: Payer: Self-pay | Admitting: Acute Care

## 2021-12-29 ENCOUNTER — Other Ambulatory Visit: Payer: Self-pay | Admitting: Acute Care

## 2021-12-29 DIAGNOSIS — R918 Other nonspecific abnormal finding of lung field: Secondary | ICD-10-CM

## 2021-12-29 NOTE — Telephone Encounter (Signed)
I have called the patient with the results of the Low Dose CT Chest. I explained that there has been the development of multiple pulmonary nodules since his previous scan. These are > 8 mm In size. I have reviewed the scan with Dr. Valeta Harms, who is in agreement with PET scan now with follow up within afew days of scan to review the resu;ts. Langley Gauss, I have ordered the PET scan.  He will need follow up with me or [facility] after the PET to review. PLease fax results to the PCP and let them know the plan. Thanks so much

## 2021-12-29 NOTE — Telephone Encounter (Signed)
Results of LDCT and plan faxed to PCP

## 2022-01-03 ENCOUNTER — Ambulatory Visit: Payer: Medicare Other

## 2022-01-04 ENCOUNTER — Ambulatory Visit (HOSPITAL_COMMUNITY)
Admission: RE | Admit: 2022-01-04 | Discharge: 2022-01-04 | Disposition: A | Discharge status: Home / Self Care | Payer: Medicare Other | Source: Ambulatory Visit | Attending: Internal Medicine | Admitting: Internal Medicine

## 2022-01-04 DIAGNOSIS — M5416 Radiculopathy, lumbar region: Secondary | ICD-10-CM

## 2022-01-06 ENCOUNTER — Ambulatory Visit (HOSPITAL_COMMUNITY)
Admission: RE | Admit: 2022-01-06 | Discharge: 2022-01-06 | Disposition: A | Discharge status: Home / Self Care | Payer: Medicare Other | Source: Ambulatory Visit | Attending: Acute Care | Admitting: Acute Care

## 2022-01-06 DIAGNOSIS — R918 Other nonspecific abnormal finding of lung field: Secondary | ICD-10-CM | POA: Diagnosis not present

## 2022-01-06 MED ORDER — FLUDEOXYGLUCOSE F - 18 (FDG) INJECTION
10.3400 | Freq: Once | INTRAVENOUS | Status: AC | PRN
  Administered 2022-01-06: 10.34 via INTRAVENOUS

## 2022-01-10 NOTE — Progress Notes (Unsigned)
History of Present Illness [name] is a 71 y.o. male current every day smoker followed through the lung cancer screening program. Most recent scan 12/27/2021 was notable for innumerable bilateral pulmonary nodules. Many of these are new, and largest which was 8.1 mm. Dr. Valeta Harms reviewed the scan and agreed PET was appropriate as follow up.He presents today for follow up, and review of PET imaging.    01/10/2022 Pt. Presents for follow up. He is a current every day smoker followed through the screening program.   Test Results: 01/06/2022 PET scan    LDCT Chest 12/27/2021  Innumerable bilateral pulmonary nodules. Many of these are new. Examples of new nodules include within the right apex at volume derived equivalent diameter 8.1 mm on image 55 of series 4, within the right lower lobe at volume derived equivalent diameter 9.6 mm on image 139, within the left apex at volume derived equivalent diameter 9.4 mm on image 58, within the right lower lobe at volume derived equivalent diameter 7.2 mm on image 191 and within the posterior right upper lobe at volume derived equivalent diameter 6.0 mm on image 85. Lung-RADS 4A, suspicious. Follow up low-dose chest CT without contrast in 3 months (please use the following order, "CT CHEST LCS NODULE FOLLOW-UP W/O CM") is recommended. Innumerable bilateral pulmonary nodules, including multiple new nodules as detailed above. Given multiplicity, these may all be inflammatory. 2. Aortic atherosclerosis (ICD10-I70.0), coronary artery atherosclerosis and emphysema (ICD10-J43.9). 3. Apparent gastric wall thickening could be due to underdistention. Correlate with symptoms of gastritis.     Latest Ref Rng & Units 04/17/2020   11:06 AM 03/24/2020   10:00 AM 08/03/2016   12:16 PM  CBC  WBC 3.4 - 10.8 x10E3/uL 11.5  13.5  10.6   Hemoglobin 13.0 - 17.7 g/dL 14.9  15.8  16.1   Hematocrit 37.5 - 51.0 % 43.6  47.5  46.9   Platelets 150 - 450 x10E3/uL  280  285  276.0        Latest Ref Rng & Units 03/24/2020   10:00 AM 02/21/2020    9:10 AM 07/05/2018   11:06 AM  BMP  Glucose 65 - 99 mg/dL 92   125   BUN 8 - 27 mg/dL 11   17   Creatinine 0.76 - 1.27 mg/dL 1.09  1.30  1.17   BUN/Creat Ratio 10 - 24 10     Sodium 134 - 144 mmol/L 141   140   Potassium 3.5 - 5.2 mmol/L 4.7   4.1   Chloride 96 - 106 mmol/L 102   106   CO2 20 - 29 mmol/L 23   26   Calcium 8.6 - 10.2 mg/dL 10.3   9.9     BNP No results found for: "BNP"  ProBNP No results found for: "PROBNP"  PFT    Component Value Date/Time   FEV1PRE 2.75 10/25/2013 1253   FEV1POST 3.00 10/25/2013 1253   FVCPRE 4.07 10/25/2013 1253   FVCPOST 4.69 10/25/2013 1253   TLC 7.37 10/25/2013 1253   DLCOUNC 27.29 10/25/2013 1253   PREFEV1FVCRT 68 10/25/2013 1253   PSTFEV1FVCRT 64 10/25/2013 1253    MR LUMBAR SPINE WO CONTRAST  Result Date: 01/05/2022 CLINICAL DATA:  Lumbar radiculitis EXAM: MRI LUMBAR SPINE WITHOUT CONTRAST TECHNIQUE: Multiplanar, multisequence MR imaging of the lumbar spine was performed. No intravenous contrast was administered. COMPARISON:  No recent imaging.  Prior is from 2010 FINDINGS: Segmentation:  Standard. Alignment:  Preserved. Vertebrae: Vertebral body  heights are maintained. Trace degenerative endplate marrow edema at a few levels. No suspicious osseous lesion. Conus medullaris and cauda equina: Conus extends to the L1-L2 level. Conus and cauda equina appear normal. Paraspinal and other soft tissues: Unremarkable. Disc levels: L1-L2:  No canal or foraminal stenosis. L2-L3:  No canal or foraminal stenosis. L3-L4:  Minimal disc bulge.  No canal or foraminal stenosis. L4-L5: Mild disc height loss. Disc bulge with endplate osteophytic ridging. Facet arthropathy with right larger than left joint effusions and ligamentum flavum thickening. No canal stenosis. Mild foraminal stenosis. L5-S1: Disc bulge. Facet arthropathy with ligamentum flavum thickening. No canal or  foraminal stenosis. IMPRESSION: Lower lumbar degenerative changes as detailed above. No high-grade stenosis. Facet arthropathy is greatest at L4-L5. Electronically Signed   By: Macy Mis M.D.   On: 01/05/2022 10:52   CT CHEST LUNG CA SCREEN LOW DOSE W/O CM  Result Date: 12/27/2021 CLINICAL DATA:  Fifty-two pack-year smoking history/current smoker EXAM: CT CHEST WITHOUT CONTRAST LOW-DOSE FOR LUNG CANCER SCREENING TECHNIQUE: Multidetector CT imaging of the chest was performed following the standard protocol without IV contrast. RADIATION DOSE REDUCTION: This exam was performed according to the departmental dose-optimization program which includes automated exposure control, adjustment of the mA and/or kV according to patient size and/or use of iterative reconstruction technique. COMPARISON:  12/25/2020 FINDINGS: Cardiovascular: Aortic atherosclerosis. Tortuous thoracic aorta. Mild cardiomegaly, without pericardial effusion. Left main and 3 vessel coronary artery calcification. Mediastinum/Nodes: No mediastinal or definite hilar adenopathy, given limitations of unenhanced CT. Lungs/Pleura: No pleural fluid. Mild to moderate centrilobular emphysema. Again identified are right-sided wedge resections. Again identified are innumerable bilateral pulmonary nodules. Many of these are new. Examples of new nodules include within the right apex at volume derived equivalent diameter 8.1 mm on image 55 of series 4, within the right lower lobe at volume derived equivalent diameter 9.6 mm on image 139, within the left apex at volume derived equivalent diameter 9.4 mm on image 58, within the right lower lobe at volume derived equivalent diameter 7.2 mm on image 191 and within the posterior right upper lobe at volume derived equivalent diameter 6.0 mm on image 85. Upper Abdomen: Normal imaged portions of the liver, spleen, pancreas, adrenal glands, kidneys. The previous left adrenal adenoma is not included. The proximal  stomach appears thick walled, but is underdistended including on 48/2. Cholecystectomy. Musculoskeletal: No acute osseous abnormality. IMPRESSION: 1. Lung-RADS 4A, suspicious. Follow up low-dose chest CT without contrast in 3 months (please use the following order, "CT CHEST LCS NODULE FOLLOW-UP W/O CM") is recommended. Innumerable bilateral pulmonary nodules, including multiple new nodules as detailed above. Given multiplicity, these may all be inflammatory. 2. Aortic atherosclerosis (ICD10-I70.0), coronary artery atherosclerosis and emphysema (ICD10-J43.9). 3. Apparent gastric wall thickening could be due to underdistention. Correlate with symptoms of gastritis. These results will be called to the ordering clinician or representative by the Radiologist Assistant, and communication documented in the PACS or [employer]. Electronically Signed   By: Abigail Miyamoto M.D.   On: 12/27/2021 10:47    Past medical hx Past Medical History:  Diagnosis Date   Allergic rhinitis    Anxiety and depression    Asthma    CAD (coronary artery disease)    Mild to moderate nonobstructive LAD disease 2006   Chronic low back pain    COPD (chronic obstructive pulmonary disease) (HCC)    Dyslipidemia    Essential hypertension    GERD (gastroesophageal reflux disease)    Hearing loss  Hemorrhoids    Hyperplastic colon polyp 12/28/10   IBS (irritable bowel syndrome)    Langerhan's cell histiocytosis (HCC)    Mitral regurgitation    OSA on CPAP    Tubular adenoma 12/28/10     Social History   Tobacco Use   Smoking status: Every Day    Packs/day: 0.50    Years: 37.00    Total pack years: 18.50    Types: Cigarettes    Start date: 05/23/1978   Smokeless tobacco: Never   Tobacco comments:    down to 0.25ppd  Vaping Use   Vaping Use: Some days  Substance Use Topics   Alcohol use: No    Alcohol/week: 0.0 standard drinks of alcohol   Drug use: No    Mr.Mcqueary reports that he has been smoking cigarettes.  He started smoking about 43 years ago. He has a 18.50 pack-year smoking history. He has never used smokeless tobacco. He reports that he does not drink alcohol and does not use drugs.  Tobacco Cessation: Current every day smoker with a 20 + pack year smoking history.  Past surgical hx, Family hx, Social hx all reviewed.  Current Outpatient Medications on File Prior to Visit  Medication Sig   albuterol (VENTOLIN HFA) 108 (90 Base) MCG/ACT inhaler Inhale 2 puffs into the lungs every 6 (six) hours as needed for wheezing or shortness of breath.   ANORO ELLIPTA 62.5-25 MCG/INH AEPB Inhale 1 puff by mouth once daily (Patient taking differently: Inhale 1 puff into the lungs daily.)   aspirin 81 MG tablet Take 81 mg by mouth daily.   cyclobenzaprine (FLEXERIL) 10 MG tablet Take 10 mg by mouth 3 (three) times daily.   fenofibrate (TRICOR) 145 MG tablet Take 145 mg by mouth at bedtime.   fish oil-omega-3 fatty acids 1000 MG capsule Take 1 g by mouth daily.   fluticasone (FLONASE) 50 MCG/ACT nasal spray Place 2 sprays into both nostrils daily as needed for allergies.   HYDROcodone-acetaminophen (NORCO) 10-325 MG per tablet Take 0.5-1 tablets by mouth 3 (three) times daily as needed for moderate pain.   losartan (COZAAR) 100 MG tablet Take 1 tablet by mouth once daily   Multiple Vitamin (MULTIVITAMIN) capsule Take 1 capsule by mouth daily.   nitroGLYCERIN (NITROSTAT) 0.4 MG SL tablet Place 1 tablet (0.4 mg total) under the tongue every 5 (five) minutes as needed for chest pain.   omeprazole (PRILOSEC) 20 MG capsule Take 20 mg by mouth daily.    simvastatin (ZOCOR) 20 MG tablet TAKE 1 TABLET BY MOUTH AT BEDTIME . APPOINTMENT REQUIRED FOR FUTURE REFILLS   tadalafil (CIALIS) 20 MG tablet Take 1 tablet (20 mg total) by mouth daily as needed for erectile dysfunction.   No current facility-administered medications on file prior to visit.     Allergies  Allergen Reactions   Bee Venom Swelling    Review  Of Systems:  Constitutional:   No  weight loss, night sweats,  Fevers, chills, fatigue, or  lassitude.  HEENT:   No headaches,  Difficulty swallowing,  Tooth/dental problems, or  Sore throat,                No sneezing, itching, ear ache, nasal congestion, post nasal drip,   CV:  No chest pain,  Orthopnea, PND, swelling in lower extremities, anasarca, dizziness, palpitations, syncope.   GI  No heartburn, indigestion, abdominal pain, nausea, vomiting, diarrhea, change in bowel habits, loss of appetite, bloody stools.   Resp: No  shortness of breath with exertion or at rest.  No excess mucus, no productive cough,  No non-productive cough,  No coughing up of blood.  No change in color of mucus.  No wheezing.  No chest wall deformity  Skin: no rash or lesions.  GU: no dysuria, change in color of urine, no urgency or frequency.  No flank pain, no hematuria   MS:  No joint pain or swelling.  No decreased range of motion.  No back pain.  Psych:  No change in mood or affect. No depression or anxiety.  No memory loss.   Vital Signs There were no vitals taken for this visit.   Physical Exam:  General- No distress,  A&Ox3 ENT: No sinus tenderness, TM clear, pale nasal mucosa, no oral exudate,no post nasal drip, no LAN Cardiac: S1, S2, regular rate and rhythm, no murmur Chest: No wheeze/ rales/ dullness; no accessory muscle use, no nasal flaring, no sternal retractions Abd.: Soft Non-tender Ext: No clubbing cyanosis, edema Neuro:  normal strength Skin: No rashes, warm and dry Psych: normal mood and behavior   Assessment/Plan  Lung RADS 4 A  Abnormal Imaging Chest CT Chest. Multiple nodules, largest 8.1 mm  PET  Plan     Magdalen Spatz, NP 01/10/2022  11:07 AM

## 2022-01-11 ENCOUNTER — Encounter: Payer: Self-pay | Admitting: Acute Care

## 2022-01-11 ENCOUNTER — Ambulatory Visit: Payer: Medicare Other | Admitting: Acute Care

## 2022-01-11 ENCOUNTER — Other Ambulatory Visit: Payer: Self-pay | Admitting: *Deleted

## 2022-01-11 VITALS — BP 128/78 | HR 79 | Temp 98.2°F | Ht 66.5 in | Wt 194.4 lb

## 2022-01-11 DIAGNOSIS — J432 Centrilobular emphysema: Secondary | ICD-10-CM

## 2022-01-11 DIAGNOSIS — R918 Other nonspecific abnormal finding of lung field: Secondary | ICD-10-CM | POA: Diagnosis not present

## 2022-01-11 DIAGNOSIS — Z122 Encounter for screening for malignant neoplasm of respiratory organs: Secondary | ICD-10-CM

## 2022-01-11 DIAGNOSIS — Z87891 Personal history of nicotine dependence: Secondary | ICD-10-CM

## 2022-01-11 DIAGNOSIS — F17210 Nicotine dependence, cigarettes, uncomplicated: Secondary | ICD-10-CM

## 2022-01-11 DIAGNOSIS — Z79899 Other long term (current) drug therapy: Secondary | ICD-10-CM | POA: Diagnosis not present

## 2022-01-11 DIAGNOSIS — J449 Chronic obstructive pulmonary disease, unspecified: Secondary | ICD-10-CM

## 2022-01-11 MED ORDER — UMECLIDINIUM-VILANTEROL 62.5-25 MCG/ACT IN AEPB: INHALATION_SPRAY | RESPIRATORY_TRACT | 0 refills | Status: DC

## 2022-01-11 NOTE — Progress Notes (Unsigned)
t

## 2022-01-11 NOTE — Patient Instructions (Addendum)
It is good to see you today. Your PET scan shows resolution , and decrease in size of the nodules of concern, Findings are likely infectious/inflammatory  This is good news.  Recommendation is for a 3 month follow up CT Chest to ensure complete resolution.  Please work on quitting smoking. Call 1-800- QUIT NOW for free nicotine patches, gum or mints.  We can see if you Qualify for financial assistance with the Anoro.  We will give you paperwork to complete.  Bring the paperwork back to the office when completed, and we will send it in to see if we can get you help in buying Anoro. I will give you some samples today.  Use Anoro, one puff once daily. Rinse mouth after use.  Use rescue inhaler as needed for shortness of breath or wheezing, no more than 4 times daily. If you are using your rescue inhaler more., please call to be seen.  Follow up in 3 months after repeat CT chest.  Please contact office for sooner follow up if symptoms do not improve or worsen or seek emergency care

## 2022-01-19 DIAGNOSIS — G894 Chronic pain syndrome: Secondary | ICD-10-CM | POA: Diagnosis not present

## 2022-01-19 DIAGNOSIS — F330 Major depressive disorder, recurrent, mild: Secondary | ICD-10-CM | POA: Diagnosis not present

## 2022-01-19 DIAGNOSIS — J449 Chronic obstructive pulmonary disease, unspecified: Secondary | ICD-10-CM | POA: Diagnosis not present

## 2022-01-19 DIAGNOSIS — I10 Essential (primary) hypertension: Secondary | ICD-10-CM | POA: Diagnosis not present

## 2022-01-21 NOTE — Progress Notes (Deleted)
Cardiology Office Note:    Date:  01/21/2022   ID:  [name], DOB 06/09/1950, MRN [id number]  PCP:  [facility], [place] [facility] Cardiologist:  Rozann Lesches, MD { Click to update primary MD,subspecialty MD or APP then REFRESH:1}  *** Referring MD: [facility], MD   Chief Complaint:  No chief complaint on file. {Click here for Visit Info    :1}    History of Present Illness:   [name] is a 71 y.o. male with  history of Nonobstructive CAD on cath 2006, atherosclerosis on CT 2020, HTN, HLD, tobacco abuse.  Patient last saw Dr. Domenic Polite 2020 and had some dizziness. Zio ordered but never done.      Past Medical History:  Diagnosis Date   Allergic rhinitis    Anxiety and depression    Asthma    CAD (coronary artery disease)    Mild to moderate nonobstructive LAD disease 2006   Chronic low back pain    COPD (chronic obstructive pulmonary disease) (HCC)    Dyslipidemia    Essential hypertension    GERD (gastroesophageal reflux disease)    Hearing loss    Hemorrhoids    Hyperplastic colon polyp 12/28/10   IBS (irritable bowel syndrome)    Langerhan's cell histiocytosis (HCC)    Mitral regurgitation    OSA on CPAP    Tubular adenoma 12/28/10   Current Medications: No outpatient medications have been marked as taking for the 01/25/22 encounter (Appointment) with Imogene Burn, PA-C.    Allergies:   Bee venom   Social History   Tobacco Use   Smoking status: Every Day    Packs/day: 0.50    Years: 37.00    Total pack years: 18.50    Types: Cigarettes    Start date: 05/23/1978   Smokeless tobacco: Never   Tobacco comments:    down to 0.25ppd  Vaping Use   Vaping Use: Some days  Substance Use Topics   Alcohol use: No    Alcohol/week: 0.0 standard drinks of alcohol   Drug use: No    Family Hx: The patient's family history includes COPD in his father, mother, and paternal grandmother; Diabetes in his paternal grandmother; GI  problems in his mother; Heart Problems in his paternal grandfather; Heart attack in his brother, maternal grandfather, and maternal grandmother; Heart disease (age of onset: 31) in his father; Hyperlipidemia in his mother; Hypertension in his brother and sister; Skin cancer in his brother. There is no history of Colon cancer, Liver disease, or Inflammatory bowel disease.  ROS   EKGs/Labs/Other Test Reviewed:    EKG:  EKG is *** ordered today.  The ekg ordered today demonstrates ***  Recent Labs: No results found for requested labs within last 365 days.   Recent Lipid Panel No results for input(s): "CHOL", "TRIG", "HDL", "VLDL", "LDLCALC", "LDLDIRECT" in the last 8760 hours.   Prior CV Studies: {Select studies to display:26339}   Lexiscan Myoview 04/28/2015: The left ventricular ejection fraction is normal (55-65%). Nuclear stress EF: 63%. There was no ST segment deviation noted during stress. The study is normal.   Normal stress nuclear study with a small, mild, fixed inferior septal defect consistent with thinning; no ischemia; EF 63 with normal wall motion.   Chest CT 12/04/2018: FINDINGS: Cardiovascular: The heart is normal in size. No pericardial effusion.   No evidence thoracic aortic aneurysm. Mild atherosclerotic calcification of the aortic arch.   Mild three-vessel coronary atherosclerosis.  Mediastinum/Nodes: No suspicious mediastinal lymphadenopathy.   Visualized thyroid is unremarkable.   Lungs/Pleura: Mild centrilobular emphysematous changes, upper lung predominant.   Mild subpleural reticulation/fibrosis in the lungs bilaterally, suggesting superimposed chronic interstitial lung disease.   No focal consolidation.   Numerous scattered bilateral pulmonary nodules, measuring up to 6.2 mm, most of which are unchanged. A few bilateral pulmonary nodules are no longer visualized. A few new pulmonary nodules measuring less than 4 mm were not conspicuous on the  prior. Overall, these findings are can be considered benign.   No pleural effusion or pneumothorax.   Upper Abdomen: Visualized upper abdomen is grossly unremarkable, noting prior cholecystectomy and a benign left adrenal adenoma.   Musculoskeletal: Degenerative changes of the visualized thoracolumbar spine.   IMPRESSION: Lung-RADS 2, benign appearance or behavior. Continue annual screening with low-dose chest CT without contrast in 12 months.   Aortic Atherosclerosis (ICD10-I70.0) and Emphysema (ICD10-J43.9).      Risk Assessment/Calculations/Metrics:   {Does this patient have ATRIAL FIBRILLATION?[phone]}     No BP recorded.  {Refresh Note OR Click here to enter BP  :1}***    Physical Exam:    VS:  There were no vitals taken for this visit.    Wt Readings from Last 3 Encounters:  01/11/22 194 lb 6.4 oz (88.2 kg)  11/24/20 192 lb 6.4 oz (87.3 kg)  05/27/20 202 lb 3.2 oz (91.7 kg)    Physical Exam ***       ASSESSMENT & PLAN:   No problem-specific Assessment & Plan notes found for this encounter.     Mild to moderate nonobstructive CAD by cardiac catheterization in 2006.  More recent screening chest CT 2020 described mild multivessel coronary atherosclerosis.  He does not report any obvious angina.  Continue medical therapy and observation.  He is on aspirin and statin   Intermittent episodes of lightheadedness/near syncope.  No obvious precipitants or associated palpitations/chest pain.  Zio ordered but never done.      Tobacco abuse, ongoing.  We have discussed smoking cessation strategies.     Essential hypertension, blood pressure is well controlled today.      {Are you ordering a CV Procedure (e.g. stress test, cath, DCCV, TEE, etc)?   Press F2        :[id number]}   Dispo:  No follow-ups on file.   Medication Adjustments/Labs and Tests Ordered: Current medicines are reviewed at length with the patient today.  Concerns regarding medicines are outlined  above.  Tests Ordered: No orders of the defined types were placed in this encounter.  Medication Changes: No orders of the defined types were placed in this encounter.  Signed, Ermalinda Barrios, PA-C  01/21/2022 8:36 AM    [facility] [place], [place], [place]  [address] Phone: [phone]; Fax: [phone]

## 2022-01-25 ENCOUNTER — Ambulatory Visit: Payer: Medicare Other | Admitting: Physician Assistant

## 2022-01-31 ENCOUNTER — Ambulatory Visit (HOSPITAL_COMMUNITY): Payer: Medicare Other | Attending: Neurosurgery | Admitting: Physical Therapy

## 2022-01-31 ENCOUNTER — Encounter (HOSPITAL_COMMUNITY): Payer: Self-pay | Admitting: Physical Therapy

## 2022-01-31 DIAGNOSIS — M5459 Other low back pain: Secondary | ICD-10-CM | POA: Insufficient documentation

## 2022-01-31 NOTE — Therapy (Signed)
OUTPATIENT PHYSICAL THERAPY THORACOLUMBAR EVALUATION   Patient Name: [name] MRN: [id number] DOB:December 11, 1950, 71 y.o., male Today's Date: 01/31/2022   PT End of Session - 01/31/22 1147     Visit Number 1    Number of Visits 12    Date for PT Re-Evaluation 03/14/22    Authorization Type [employer]BS Medicare    Progress Note Due on Visit 10    PT Start Time 1110    PT Stop Time 1150    PT Time Calculation (min) 40 min    Activity Tolerance Patient tolerated treatment well    Behavior During Therapy [facility] for tasks assessed/performed             Past Medical History:  Diagnosis Date   Allergic rhinitis    Anxiety and depression    Asthma    CAD (coronary artery disease)    Mild to moderate nonobstructive LAD disease 2006   Chronic low back pain    COPD (chronic obstructive pulmonary disease) (HCC)    Dyslipidemia    Essential hypertension    GERD (gastroesophageal reflux disease)    Hearing loss    Hemorrhoids    Hyperplastic colon polyp 12/28/10   IBS (irritable bowel syndrome)    Langerhan's cell histiocytosis (HCC)    Mitral regurgitation    OSA on CPAP    Tubular adenoma 12/28/10   Past Surgical History:  Procedure Laterality Date   CHOLECYSTECTOMY     COLONOSCOPY  05/23/2005   friable anal canal, hyperplastic polyp   COLONOSCOPY  12/28/2010   Dr. Gala Romney- tubular adenoma, hyperplastic polyp   COLONOSCOPY N/A 01/16/2015   Rourk: anal canal and internal hemorrhois. next surveillance colonoscopy 12/2019   COLONOSCOPY WITH PROPOFOL N/A 04/09/2020   Rourk-normal, query chronic appendicitis as cause of ongoing RLQ abd pain   EAR PINNA RECONSTRUCTION W/ RIB GRAFT     right   ESOPHAGOGASTRODUODENOSCOPY     multiple dilations, last EGD 2007 showed small hh, adenomatous appearing gastric mucosa in body but biopsies benign. SB bx negative for Celiac.   ESOPHAGOGASTRODUODENOSCOPY  12/28/2010   Dr. Gala Romney- normal esophagus s/p dilationpatchy erythema and erosions.   FOOT  SURGERY     right   INGUINAL HERNIA REPAIR     left   INGUINAL HERNIA REPAIR Left 07/20/2018   Procedure: LEFT INDIRECT INGUINAL AND FEMORAL HERNIA REPAIR WITH MESH;  Surgeon: Virl Cagey, MD;  Location: [facility];  Service: General;  Laterality: Left;   LUNG BIOPSY  05/24/2007   right   MALONEY DILATION  12/28/2010   Procedure: Venia Minks DILATION;  Surgeon: Daneil Dolin, MD;  Location: [facility];  Service: Endoscopy;  Laterality: N/A;   TONSILLECTOMY     TRANSTHORACIC ECHOCARDIOGRAM  02/21/2011   EF =>55%, normal chamber size & function; mild mitral and aortic insuff, mild-mod tricuspid insuff; mild pulm htn with rsvp of 27mHg   Patient Active Problem List   Diagnosis Date Noted   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   Abdominal pain 03/24/2020   History of colonic polyps 03/24/2020   Femoral hernia of left side without obstruction or gangrene    Recurrent left inguinal hernia 07/05/2018   Right inguinal hernia 07/05/2018   Fatigue 08/03/2016   Right sided abdominal pain 09/14/2015   CAD (coronary artery disease), native coronary artery 06/19/2015   Hemorrhoid    Heme + stool    Tobacco use disorder 11/07/2013   Other [place] 10/22[place]   [place]D with acute exacerbation ([place]  03/21/2013   Chest pain on exertion 02/27/2013   Aortic insufficiency 02/27/2013   Stiffness of knee joint 01/29/2013   Right leg weakness 01/29/2013   Difficulty walking 01/29/2013   Right lower quadrant abdominal pain 09/09/2011   Abdominal tenderness, epigastric 12/21/2010   ANXIETY 08/11/2008   MITRAL REGURGITATION 08/11/2008   COPD exacerbation ([place]) 08/11/2008   SCHATZKI'S RING 08/11/2008   BACK PAIN, CHRONIC 08/11/2008   BEE STING ALLERGY 08/11/2008   DEPRESSION 08/08/2008   GASTROESOPHAGEAL REFLUX DISEASE 08/08/2008   HIATAL HERNIA 08/08/2008   IBS 08/08/2008   ANOREXIA 08/08/2008   WEIGHT LOSS 08/08/2008   Esophageal dysphagia 08/08/2008   DIARRHEA 08/08/2008   EPIGASTRIC PAIN  08/08/2008   Hematochezia 08/08/2008   Mixed hyperlipidemia 10/22/2007   Obstructive sleep apnea 10/22/2007   Essential hypertension 10/22/2007   ALLERGIC RHINITIS 10/22/2007   ASTHMA 10/22/2007    PCP: Redmond School MD   REFERRING PROVIDER: Duffy Rhody MD   REFERRING DIAG: Pt Eval And Tx for Lumbar Back Pain with Radiculopathy affecting right lower extemity M54.16  Rationale for Evaluation and Treatment Rehabilitation  THERAPY DIAG:  Other low back pain - Plan: PT plan of care cert/re-cert  ONSET DATE: Chronic   SUBJECTIVE:                                                                                                                                                                                           SUBJECTIVE STATEMENT: Patient presents to therapy with complaint of LBP. This is chronic in nature. He reports recent decrease in activity last year while caring for his mother and notes that symptoms have gotten worse since then. He has a long standing history of problems with his back, sometimes into his RT leg as well. He takes pain medicate as needed for this.   PERTINENT HISTORY:  Chronic LBP, HTN  PAIN:  Are you having pain? Yes: NPRS scale: 5-6/10 Pain location: low back  Pain description: stabbing, burning, hurting pain  Aggravating factors: bending down, leaning forward, lifting  Relieving factors: meds, laying in fetal position    PRECAUTIONS: None  WEIGHT BEARING RESTRICTIONS No  FALLS:  Has patient fallen in last 6 months? No  LIVING ENVIRONMENT: Lives with: lives with their family and lives with their spouse Lives in: Mobile home Stairs: Yes: External: 3 steps; none Has following equipment at home: Single point cane  OCCUPATION: retired   PLOF: Independent  PATIENT GOALS "Where I can do stuff and it don't hurt so bad"    OBJECTIVE:   DIAGNOSTIC FINDINGS:  IMPRESSION: Lower lumbar degenerative changes as detailed above. No  high-grade  stenosis. Facet arthropathy is greatest at L4-L5.  PATIENT SURVEYS:  FOTO (complete next visit)  COGNITION:  Overall cognitive status: Within functional limits for tasks assessed     SENSATION: WFL  PALPATION: Min/ mod TTP about bilateral lumbar paraspinals (L3-5)   LUMBAR ROM:   Active  A/PROM  eval  Flexion 15% limited  Extension 30% limited  Right lateral flexion 25% limited  Left lateral flexion 25% limited  Right rotation full  Left rotation full   (Blank rows = not tested)   LOWER EXTREMITY MMT:    MMT Right eval Left eval  Hip flexion 4+ 5  Hip extension 4 4  Hip abduction 4 4  Hip adduction    Hip internal rotation    Hip external rotation    Knee flexion    Knee extension 4+ 5  Ankle dorsiflexion 5 5  Ankle plantarflexion    Ankle inversion    Ankle eversion     (Blank rows = not tested)  LUMBAR SPECIAL TESTS:  Straight leg raise test: Negative  FUNCTIONAL TESTS:  5 times sit to stand: 14.7 sec with no UE  2 minute walk test: 450 feet no AD  GAIT: Distance walked: 450 feet Assistive device utilized: None Level of assistance: Complete Independence Comments: decreased stance on RLE     TODAY'S TREATMENT  01/31/22 Eval Seated piriformis stretch Seated sidebend  Sit to stand   PATIENT EDUCATION:  Education details: on eval findings, POC and HEP  Person educated: Patient Education method: Explanation Education comprehension: verbalized understanding   HOME EXERCISE PROGRAM: Access Code: [email or web address] URL: [email or web address] Date: 01/31/2022 Prepared by: Josue Hector  Exercises - Seated Piriformis Stretch  - 3 x daily - 7 x weekly - 1 sets - 10 reps - 5 second hold - Seated Sidebending  - 3 x daily - 7 x weekly - 1 sets - 10 reps - 5 second hold - Sit to Stand Without Arm Support  - 3 x daily - 7 x weekly - 1 sets - 10 reps  ASSESSMENT:  CLINICAL IMPRESSION: Patient is a 71 y.o. male who presents to  physical therapy with complaint of LBP. Patient demonstrates muscle weakness, reduced ROM, and fascial restrictions which are likely contributing to symptoms of pain and are negatively impacting patient ability to perform ADLs and functional mobility tasks. Patient will benefit from skilled physical therapy services to address these deficits to reduce pain and improve level of function with ADLs and functional mobility tasks.    OBJECTIVE IMPAIRMENTS Abnormal gait, decreased activity tolerance, difficulty walking, decreased ROM, decreased strength, increased fascial restrictions, impaired flexibility, improper body mechanics, and pain.   ACTIVITY LIMITATIONS carrying, lifting, bending, standing, squatting, transfers, and locomotion level  PARTICIPATION LIMITATIONS: meal prep, cleaning, laundry, driving, shopping, community activity, and yard work  PERSONAL FACTORS Time since onset of injury/illness/exacerbation are also affecting patient's functional outcome.   REHAB POTENTIAL: Good  CLINICAL DECISION MAKING: Stable/uncomplicated  EVALUATION COMPLEXITY: Low   GOALS: SHORT TERM GOALS: Target date: 02/21/2022  Patient will be independent with initial HEP and self-management strategies to improve functional outcomes Baseline:  Goal status: INITIAL    LONG TERM GOALS: Target date: 03/14/2022  Patient will be independent with advanced HEP and self-management strategies to improve functional outcomes Baseline:  Goal status: INITIAL  2.  Patient will improve FOTO score to predicted value to indicate improvement in functional outcomes Baseline: Complete next visit Goal status: INITIAL  3.  Patient will report reduction of back  pain to <3/10 for improved quality of life and ability to perform ADLs  Baseline: 5-6/10 Goal status: INITIAL  4. Patient will have equal to or > 4+/5 MMT throughout BLE to improve ability to perform functional mobility, stair ambulation and ADLs.  Baseline:  See MMT Goal status: INITIAL  PLAN: PT FREQUENCY: 1-2x/week  PT DURATION: 6 weeks  PLANNED INTERVENTIONS: Therapeutic exercises, Therapeutic activity, Neuromuscular re-education, Balance training, Gait training, Patient/Family education, Joint manipulation, Joint mobilization, Stair training, Aquatic Therapy, Dry Needling, Electrical stimulation, Spinal manipulation, Spinal mobilization, Cryotherapy, Moist heat, scar mobilization, Taping, Traction, Ultrasound, Biofeedback, Ionotophoresis '4mg'$ /ml Dexamethasone, and Manual therapy. Marland Kitchen  PLAN FOR NEXT SESSION: Complete FOTO, progress lumbar mobility and core/ LE strengthening as tolerated.   11:50 AM, 01/31/22 Josue Hector PT DPT  Physical Therapist with [facility]  [phone]

## 2022-02-10 ENCOUNTER — Ambulatory Visit: Payer: Medicare Other | Admitting: Cardiology

## 2022-02-24 DIAGNOSIS — J449 Chronic obstructive pulmonary disease, unspecified: Secondary | ICD-10-CM | POA: Diagnosis not present

## 2022-02-24 DIAGNOSIS — Z0001 Encounter for general adult medical examination with abnormal findings: Secondary | ICD-10-CM | POA: Diagnosis not present

## 2022-02-24 DIAGNOSIS — G894 Chronic pain syndrome: Secondary | ICD-10-CM | POA: Diagnosis not present

## 2022-02-24 DIAGNOSIS — F330 Major depressive disorder, recurrent, mild: Secondary | ICD-10-CM | POA: Diagnosis not present

## 2022-02-24 DIAGNOSIS — I10 Essential (primary) hypertension: Secondary | ICD-10-CM | POA: Diagnosis not present

## 2022-03-29 ENCOUNTER — Other Ambulatory Visit: Payer: Self-pay | Admitting: *Deleted

## 2022-03-29 DIAGNOSIS — R918 Other nonspecific abnormal finding of lung field: Secondary | ICD-10-CM

## 2022-03-29 DIAGNOSIS — Z87891 Personal history of nicotine dependence: Secondary | ICD-10-CM

## 2022-03-30 ENCOUNTER — Ambulatory Visit (HOSPITAL_COMMUNITY)
Admission: RE | Admit: 2022-03-30 | Discharge: 2022-03-30 | Disposition: A | Discharge status: Home / Self Care | Payer: Medicare Other | Source: Ambulatory Visit | Attending: Acute Care | Admitting: Acute Care

## 2022-03-30 DIAGNOSIS — Z87891 Personal history of nicotine dependence: Secondary | ICD-10-CM | POA: Insufficient documentation

## 2022-03-30 DIAGNOSIS — R918 Other nonspecific abnormal finding of lung field: Secondary | ICD-10-CM | POA: Diagnosis not present

## 2022-04-02 IMAGING — CT CT CHEST LUNG CANCER SCREENING LOW DOSE W/O CM
1 of 2 series · 10 of 20 positions shown, 13 images · non-contrast
Comparison: 12/25/2019

CLINICAL DATA: Current smoker.  Fifty-one pack-year history.

EXAM:
CT CHEST WITHOUT CONTRAST LOW-DOSE FOR LUNG CANCER SCREENING
TECHNIQUE: Multidetector CT imaging of the chest was performed following the
standard protocol without IV contrast.

[ct lung segmentation data · axial · 0.70mm/px · z∈[+946,+946]mm · 10 of 314 frames shown]
[frame 1/314  mediastinal]
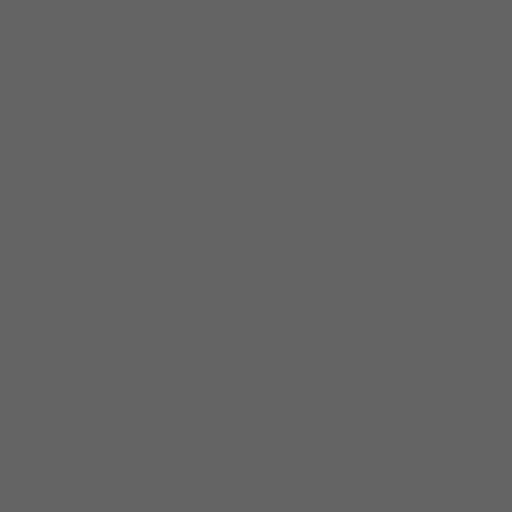
[frame 1/314  lung]
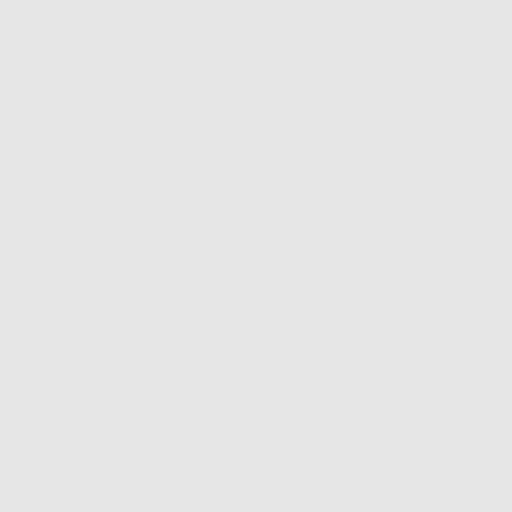
[frame 35/314  lung]
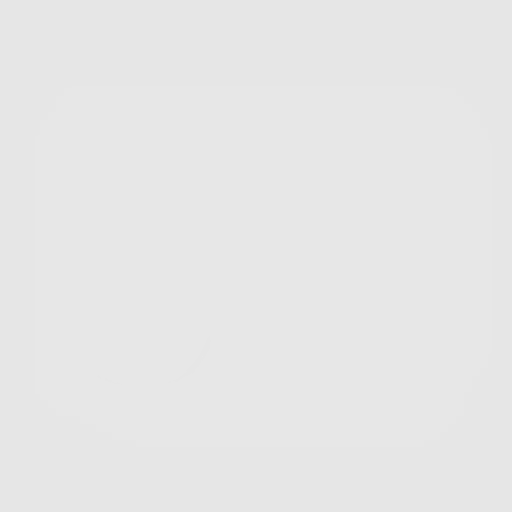
[frame 70/314  lung]
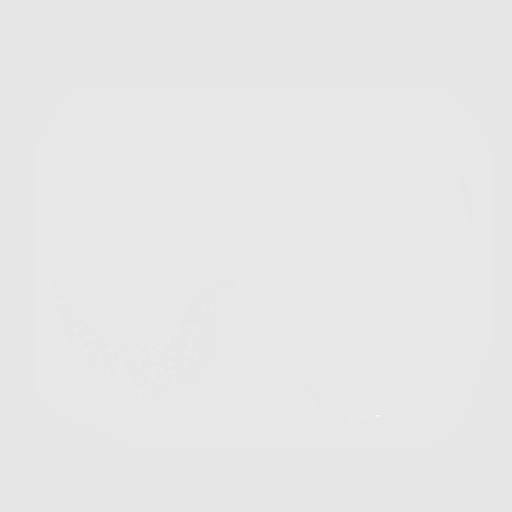
[frame 105/314  lung]
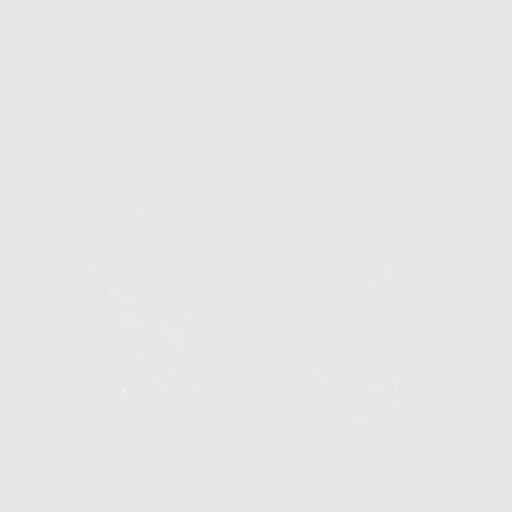
[frame 140/314  mediastinal]
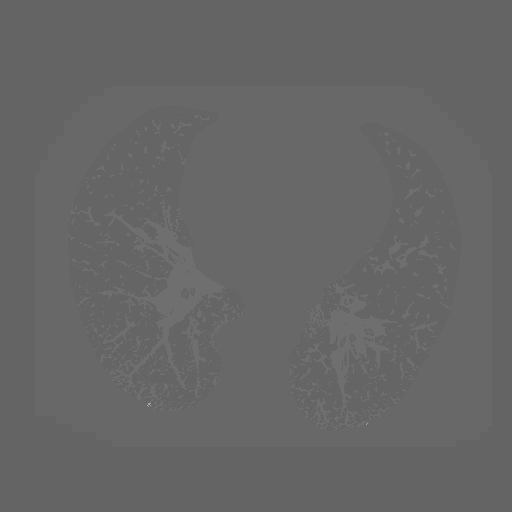
[frame 140/314  lung]
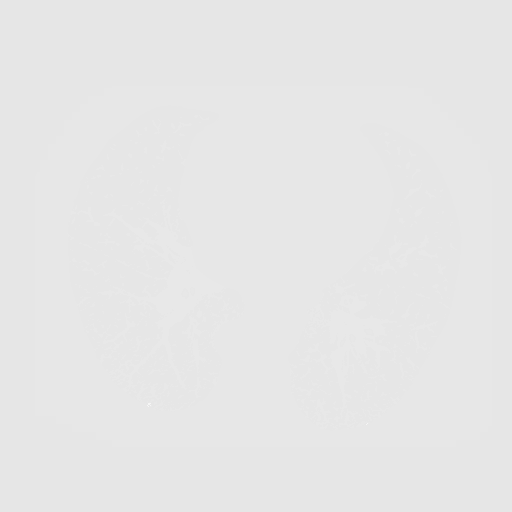
[frame 174/314  lung]
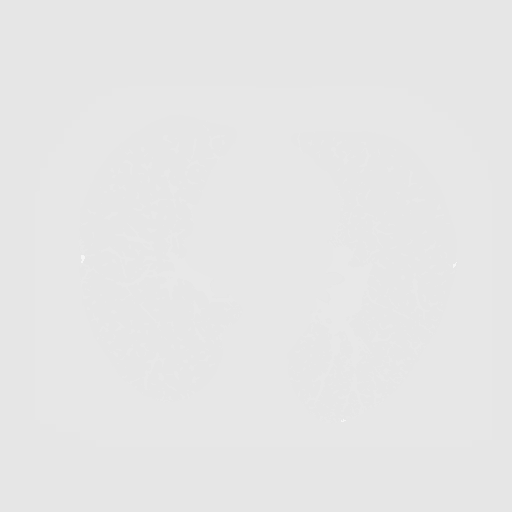
[frame 209/314  lung]
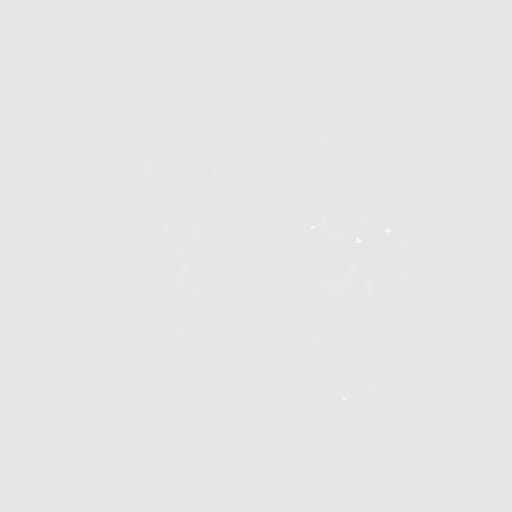
[frame 244/314  lung]
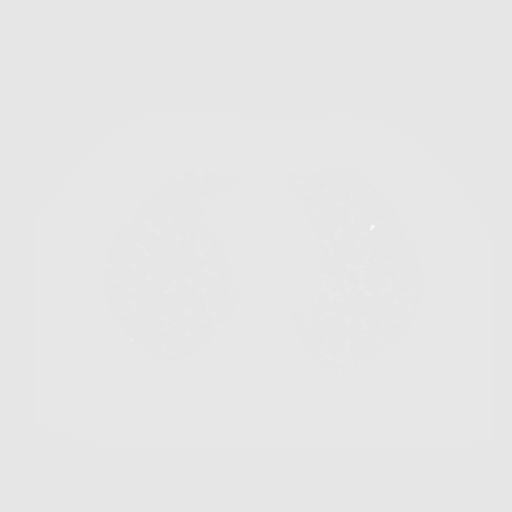
[frame 279/314  mediastinal]
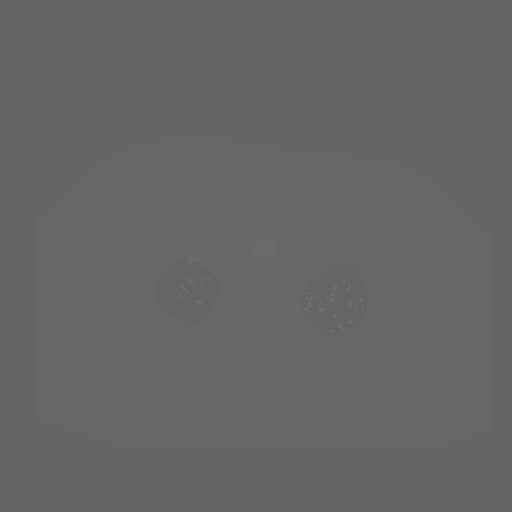
[frame 279/314  lung]
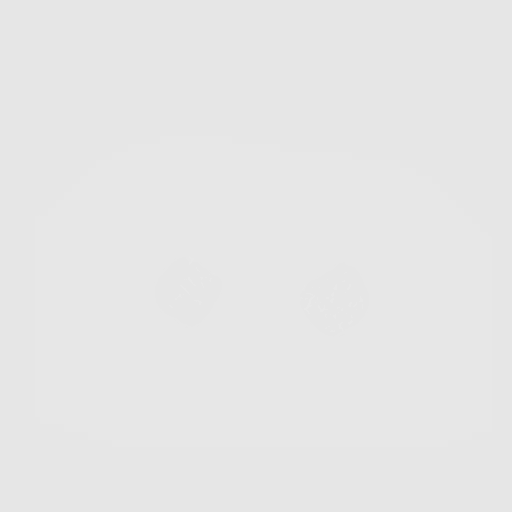
[frame 314/314  lung]
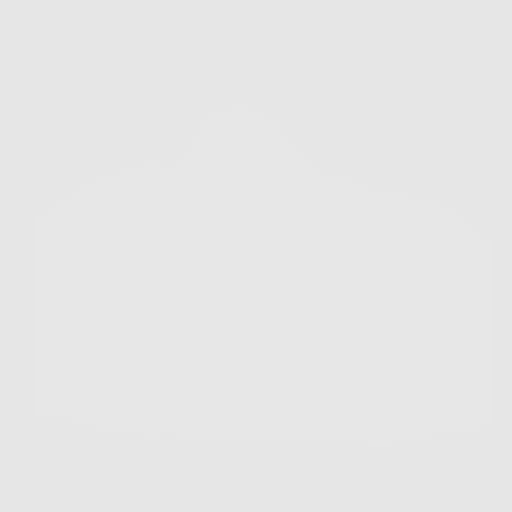

[10 of 20 positions shown; findings below may reference images not displayed]

FINDINGS: Cardiovascular: Aortic atherosclerosis. Tortuous thoracic aorta.
Mild cardiomegaly, without pericardial effusion. Three vessel
coronary artery calcification.

Mediastinum/Nodes: No mediastinal or definite hilar adenopathy,
given limitations of unenhanced CT.

Lungs/Pleura: No pleural fluid. Prior right sided lung wedge
resection or biopsies. Mild centrilobular emphysema. Innumerable
pulmonary nodules. These measure maximally volume derived equivalent
diameter 5.9 mm and are felt to be similar.

Upper Abdomen: Cholecystectomy. Normal imaged portions of the liver,
spleen, stomach, pancreas, right adrenal gland, kidneys. Low-density
left adrenal nodularity is similar at 1.9 cm, likely an adenoma.

Musculoskeletal: No acute osseous abnormality.
IMPRESSION: 1. Lung-RADS 2, benign appearance or behavior. Continue annual
screening with low-dose chest CT without contrast in 12 months.
2. Aortic Atherosclerosis (9J28M-AHM.M) and Emphysema ([id number]-YKT.S).
Coronary artery atherosclerosis.
3. Left adrenal adenoma.

## 2022-04-07 ENCOUNTER — Telehealth: Payer: Self-pay | Admitting: Acute Care

## 2022-04-07 DIAGNOSIS — I10 Essential (primary) hypertension: Secondary | ICD-10-CM | POA: Diagnosis not present

## 2022-04-07 DIAGNOSIS — M5416 Radiculopathy, lumbar region: Secondary | ICD-10-CM | POA: Diagnosis not present

## 2022-04-07 DIAGNOSIS — Z87891 Personal history of nicotine dependence: Secondary | ICD-10-CM

## 2022-04-07 DIAGNOSIS — J449 Chronic obstructive pulmonary disease, unspecified: Secondary | ICD-10-CM | POA: Diagnosis not present

## 2022-04-07 DIAGNOSIS — J329 Chronic sinusitis, unspecified: Secondary | ICD-10-CM | POA: Diagnosis not present

## 2022-04-07 DIAGNOSIS — Z122 Encounter for screening for malignant neoplasm of respiratory organs: Secondary | ICD-10-CM

## 2022-04-07 DIAGNOSIS — F17210 Nicotine dependence, cigarettes, uncomplicated: Secondary | ICD-10-CM

## 2022-04-07 NOTE — Progress Notes (Signed)
History of Present Illness [name] is a 71 y.o. male current every day smoker with a  with COPD, followed by the screening program.   12/2021 LR 4 A, Multiple new nodules > 8 mm  01/07/2022  PET>> Some Resolution of previously noted nodules>> plan 3 month follow up  04/01/2022 LDCT 3 month follow up>> Nodules are shrinking supporting infectious inflammatory findings>> LR 2, back to annual screening   04/12/2022 Pt. Presents for follow up after 3 month LDCT follow up.His scan was read as a LR 2, multiple nodules have continued to decrease in size supporting infectious inflammatory changes.We will do a 12 month follow up due 03/2023.   Pt. States he is doing well. He ran out of his Anoro, so we will send in a refill and we will also give him paperwork for financial assistance. He is also having a COPD flare. He was treated with prednisone , but is coughing up thick gray mucus.He also has had a fever.  We will send in a prescription for Augmentin x 1 week. I will have him follow up as needed.    Test Results: LDCT 04/01/2022>> 3 month follow up after PET Lung-RADS 2, benign appearance or behavior. Continue annual screening with low-dose chest CT without contrast in 12 months. 2. 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 3. Left adrenal adenoma. 4. Marked thickening of the greater curvature of stomach, as on 02/21/2020. Please correlate clinically for chronic gastritis. 5. Aortic atherosclerosis (ICD10-I70.0). Coronary artery calcification.  01/06/2022 PET scan  Upper lobe predominant pulmonary nodules measure up to approximately 7 mm in the right upper lobe (9/19), too small for PET resolution. Apical left upper lobe nodule measures 6 mm (9/20), previously 10 mm. No hypermetabolic lymph nodes Small bilateral pulmonary nodules are too small for PET resolution. At least 1, in  the left upper lobe, appears smaller. Findings are likely infectious/inflammatory etiology. Recommend follow-up CT chest without contrast in 3 months in further evaluation, as malignancy cannot be definitively excluded. 2. Left adrenal adenoma. 3. Thickened greater curvature of the stomach. Correlate for gastritis. 4.  Aortic atherosclerosis (ICD10-I70.0).     LDCT Chest 12/27/2021  Innumerable bilateral pulmonary nodules. Many of these are new. Examples of new nodules include within the right apex at volume derived equivalent diameter 8.1 mm on image 55 of series 4, within the right lower lobe at volume derived equivalent diameter 9.6 mm on image 139, within the left apex at volume derived equivalent diameter 9.4 mm on image 58, within the right lower lobe at volume derived equivalent diameter 7.2 mm on image 191 and within the posterior right upper lobe at volume derived equivalent diameter 6.0 mm on image 85. Lung-RADS 4A, suspicious. Follow up low-dose chest CT without contrast in 3 months (please use the following order, "CT CHEST LCS NODULE FOLLOW-UP W/O CM") is recommended. Innumerable bilateral pulmonary nodules, including multiple new nodules as detailed above. Given multiplicity, these may all be inflammatory. 2. Aortic atherosclerosis (ICD10-I70.0), coronary artery atherosclerosis and emphysema (ICD10-J43.9). 3. Apparent gastric wall thickening could be due to underdistention. Correlate with symptoms of gastritis.  Spirometry 2015           Latest Ref Rng & Units 04/17/2020   11:06 AM 03/24/2020   10:00 AM 08/03/2016   12:16 PM  CBC  WBC 3.4 - 10.8 x10E3/uL 11.5  13.5  10.6   Hemoglobin 13.0 -  17.7 g/dL 14.9  15.8  16.1   Hematocrit 37.5 - 51.0 % 43.6  47.5  46.9   Platelets 150 - 450 x10E3/uL 280  285  276.0        Latest Ref Rng & Units 03/24/2020   10:00 AM 02/21/2020    9:10 AM 07/05/2018   11:06 AM  BMP  Glucose 65 - 99 mg/dL 92   125   BUN 8 - 27  mg/dL 11   17   Creatinine 0.76 - 1.27 mg/dL 1.09  1.30  1.17   BUN/Creat Ratio 10 - 24 10     Sodium 134 - 144 mmol/L 141   140   Potassium 3.5 - 5.2 mmol/L 4.7   4.1   Chloride 96 - 106 mmol/L 102   106   CO2 20 - 29 mmol/L 23   26   Calcium 8.6 - 10.2 mg/dL 10.3   9.9     BNP No results found for: "BNP"  ProBNP No results found for: "PROBNP"  PFT    Component Value Date/Time   FEV1PRE 2.75 10/25/2013 1253   FEV1POST 3.00 10/25/2013 1253   FVCPRE 4.07 10/25/2013 1253   FVCPOST 4.69 10/25/2013 1253   TLC 7.37 10/25/2013 1253   DLCOUNC 27.29 10/25/2013 1253   PREFEV1FVCRT 68 10/25/2013 1253   PSTFEV1FVCRT 64 10/25/2013 1253    CT CHEST LCS NODULE F/U LOW DOSE WO CONTRAST  Result Date: 04/01/2022 CLINICAL DATA:  Current smoker, 52 pack-year history. EXAM: CT CHEST WITHOUT CONTRAST FOR LUNG CANCER SCREENING NODULE FOLLOW-UP TECHNIQUE: Multidetector CT imaging of the chest was performed following the standard protocol without IV contrast. RADIATION DOSE REDUCTION: This exam was performed according to the departmental dose-optimization program which includes automated exposure control, adjustment of the mA and/or kV according to patient size and/or use of iterative reconstruction technique. COMPARISON:  PET 01/06/2022, CT chest 12/27/2021, 12/25/2020 and CT abdomen pelvis 02/21/2020. FINDINGS: Cardiovascular: Atherosclerotic calcification of the aorta, aortic valve and coronary arteries. Heart is enlarged. No pericardial effusion. Mediastinum/Nodes: No pathologically enlarged mediastinal or axillary lymph nodes. Hilar regions are difficult to definitively evaluate without IV contrast. Esophagus is unremarkable. Lungs/Pleura: Postoperative scarring in the anterior posterior segments of the right upper lobe. Pulmonary nodules measure 8.2 mm or less in size, as before. No new pulmonary nodules no pleural fluid. Airway is unremarkable. Upper Abdomen: Visualized portions of the liver and right  adrenal gland are unremarkable. 2.1 cm nodule in the medial limb left adrenal gland measures 1 Hounsfield unit. No specific follow-up necessary. Visualized portions of the kidneys, spleen and pancreas are unremarkable. Marked thickening of the greater curvature of the stomach, as on 02/21/2020. Cholecystectomy. No upper abdominal adenopathy. Musculoskeletal: Degenerative changes in the spine. IMPRESSION: 1. Lung-RADS 2, benign appearance or behavior. Continue annual screening with low-dose chest CT without contrast in 12 months. 2. 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 3. Left adrenal adenoma. 4. Marked thickening of the greater curvature of stomach, as on 02/21/2020. Please correlate clinically for chronic gastritis. 5. Aortic atherosclerosis (ICD10-I70.0). Coronary artery calcification. Electronically Signed   By: Lorin Picket M.D.   On: 04/01/2022 12:01    Past medical hx Past Medical History:  Diagnosis Date   Allergic rhinitis    Anxiety and depression    Asthma    CAD (coronary artery disease)    Mild to moderate nonobstructive LAD disease 2006   Chronic  low back pain    COPD (chronic obstructive pulmonary disease) (HCC)    Dyslipidemia    Essential hypertension    GERD (gastroesophageal reflux disease)    Hearing loss    Hemorrhoids    Hyperplastic colon polyp 12/28/10   IBS (irritable bowel syndrome)    Langerhan's cell histiocytosis (HCC)    Mitral regurgitation    OSA on CPAP    Tubular adenoma 12/28/10     Social History   Tobacco Use   Smoking status: Every Day    Packs/day: 1.00    Years: 52.00    Total pack years: 52.00    Types: Cigarettes    Start date: 05/23/1978   Smokeless tobacco: Never   Tobacco comments:    down to 0.25ppd  Vaping Use   Vaping Use: Some days  Substance Use Topics   Alcohol use: No    Alcohol/week: 0.0 standard drinks of alcohol   Drug  use: No    Mr.Gasper reports that he has been smoking cigarettes. He started smoking about 43 years ago. He has a 52.00 pack-year smoking history. He has never used smokeless tobacco. He reports that he does not drink alcohol and does not use drugs.  Tobacco Cessation: Current every day smoker. Has cut down to 1/4 PPD. Pack year history is 52 pack years.   Past surgical hx, Family hx, Social hx all reviewed.  Current Outpatient Medications on File Prior to Visit  Medication Sig   albuterol (VENTOLIN HFA) 108 (90 Base) MCG/ACT inhaler Inhale 2 puffs into the lungs every 6 (six) hours as needed for wheezing or shortness of breath.   amLODipine (NORVASC) 5 MG tablet Take 5 mg by mouth. Take 2 tablets daily.   aspirin 81 MG tablet Take 81 mg by mouth daily.   cyclobenzaprine (FLEXERIL) 10 MG tablet Take 10 mg by mouth 3 (three) times daily.   fenofibrate (TRICOR) 145 MG tablet Take 145 mg by mouth at bedtime.   fish oil-omega-3 fatty acids 1000 MG capsule Take 1 g by mouth daily.   fluticasone (FLONASE) 50 MCG/ACT nasal spray Place 2 sprays into both nostrils daily as needed for allergies.   HYDROcodone-acetaminophen (NORCO) 10-325 MG per tablet Take 0.5-1 tablets by mouth 3 (three) times daily as needed for moderate pain.   losartan (COZAAR) 100 MG tablet Take 1 tablet by mouth once daily   Multiple Vitamin (MULTIVITAMIN) capsule Take 1 capsule by mouth daily.   nitroGLYCERIN (NITROSTAT) 0.4 MG SL tablet Place 1 tablet (0.4 mg total) under the tongue every 5 (five) minutes as needed for chest pain.   omeprazole (PRILOSEC) 20 MG capsule Take 20 mg by mouth daily.    simvastatin (ZOCOR) 20 MG tablet TAKE 1 TABLET BY MOUTH AT BEDTIME . APPOINTMENT REQUIRED FOR FUTURE REFILLS   tadalafil (CIALIS) 20 MG tablet Take 1 tablet (20 mg total) by mouth daily as needed for erectile dysfunction.   No current facility-administered medications on file prior to visit.     Allergies  Allergen Reactions    Bee Venom Swelling    Review Of Systems:  Constitutional:   No  weight loss, night sweats,  Fevers, chills, fatigue, or  lassitude.  HEENT:   No headaches,  Difficulty swallowing,  Tooth/dental problems, or  Sore throat,                No sneezing, itching, ear ache, nasal congestion, post nasal drip,   CV:  No chest pain,  Orthopnea,  PND, swelling in lower extremities, anasarca, dizziness, palpitations, syncope.   GI  No heartburn, indigestion, abdominal pain, nausea, vomiting, diarrhea, change in bowel habits, loss of appetite, bloody stools.   Resp: + shortness of breath with exertion less at rest.  + excess mucus, + productive cough,  No non-productive cough,  No coughing up of blood.  + change in color of mucus.  + wheezing.  No chest wall deformity  Skin: no rash or lesions.  GU: no dysuria, change in color of urine, no urgency or frequency.  No flank pain, no hematuria   MS:  No joint pain or swelling.  No decreased range of motion.  No back pain.  Psych:  No change in mood or affect. No depression or anxiety.  No memory loss.   Vital Signs BP (!) 140/82 (BP Location: Left Arm, Cuff Size: Normal)   Pulse 66   Temp 98.2 F (36.8 C) (Oral)   Ht '5\' 6"'$  (1.676 m)   Wt 197 lb 14.4 oz (89.8 kg)   SpO2 99%   BMI 31.94 kg/m    Physical Exam:  General- No distress,  A&Ox3, pleasant  ENT: No sinus tenderness, TM clear, pale nasal mucosa, no oral exudate,no post nasal drip, no LAN Cardiac: S1, S2, regular rate and rhythm, no murmur Chest: + wheeze/ No rales/ dullness; no accessory muscle use, no nasal flaring, no sternal retractions Abd.: Soft Non-tender, nondistended, bowel sounds positive, Body mass index is 31.94 kg/m.  Ext: No clubbing cyanosis, edema Neuro:  normal strength, moving all extremities x 4, alert and oriented x 3, appropriate Skin: No rashes, warm and dry, no lesions Psych: normal mood and behavior   Assessment/Plan Lung nodules on imaging Current  every day smoker COPD flare Plan Your CT chest looks good, annual scan due 03/2023. This is good news. We will send in a prescription for Anoro. One puff once daily. Rinse mouth after use.  We will also give you paperwork to complete for financial assistance through the drug company.  We will send in a prescription for Augmentin 875/125.  Take one tablet twice daily x 1 week.  Eat Activia Yogurt while on antibiotics.  Call if you are no better after treatment is completed.  Flu vaccine today. Please contact office for sooner follow up if symptoms do not improve or worsen or seek emergency care    I spent 40 minutes dedicated to the care of this patient on the date of this encounter to include pre-visit review of records, face-to-face time with the patient discussing conditions above, post visit ordering of testing, clinical documentation with the electronic health record, making appropriate referrals as documented, and communicating necessary information to the patient's healthcare team.    Magdalen Spatz, NP 04/12/2022  1:19 PM

## 2022-04-07 NOTE — Telephone Encounter (Signed)
CT Results faxed to PCP. Order placed for 12 mth follow up lung screening CT.

## 2022-04-07 NOTE — Telephone Encounter (Signed)
This was a 3 month follow up after an abnormal scan and PET. He has an appointment with me 04/12/2022, I will review the results with him then. Thanks so much. 12 month annual follow up due 03/2023.  Fax results to PCP. Thank You!!

## 2022-04-12 ENCOUNTER — Encounter: Payer: Self-pay | Admitting: Acute Care

## 2022-04-12 ENCOUNTER — Ambulatory Visit: Payer: Medicare Other | Admitting: Acute Care

## 2022-04-12 VITALS — BP 140/82 | HR 66 | Temp 98.2°F | Ht 66.0 in | Wt 197.9 lb

## 2022-04-12 DIAGNOSIS — J432 Centrilobular emphysema: Secondary | ICD-10-CM | POA: Diagnosis not present

## 2022-04-12 DIAGNOSIS — F17210 Nicotine dependence, cigarettes, uncomplicated: Secondary | ICD-10-CM

## 2022-04-12 DIAGNOSIS — J441 Chronic obstructive pulmonary disease with (acute) exacerbation: Secondary | ICD-10-CM | POA: Diagnosis not present

## 2022-04-12 DIAGNOSIS — J449 Chronic obstructive pulmonary disease, unspecified: Secondary | ICD-10-CM

## 2022-04-12 DIAGNOSIS — Z23 Encounter for immunization: Secondary | ICD-10-CM | POA: Diagnosis not present

## 2022-04-12 MED ORDER — AMOXICILLIN-POT CLAVULANATE 875-125 MG PO TABS: 1.0000 | ORAL_TABLET | Freq: Two times a day (BID) | ORAL | 0 refills | Status: AC

## 2022-04-12 MED ORDER — ANORO ELLIPTA 62.5-25 MCG/ACT IN AEPB: 1.0000 | INHALATION_SPRAY | Freq: Every day | RESPIRATORY_TRACT | 2 refills | Status: DC

## 2022-04-12 MED ORDER — UMECLIDINIUM-VILANTEROL 62.5-25 MCG/ACT IN AEPB: INHALATION_SPRAY | RESPIRATORY_TRACT | 0 refills | Status: DC

## 2022-04-12 NOTE — Patient Instructions (Addendum)
Your CT chest looks good, annual scan due 03/2023. This is good news. We will send in a prescription for Anoro. One puff once daily. Rinse mouth after use.  We will also give you paperwork to complete for financial assistance through the drug company.  We will send in a prescription for Augmentin 875/125.  Take one tablet twice daily x 1 week.  Eat Activia Yogurt while on antibiotics.  Call if you are no better after treatment is completed.  Flu vaccine today. Please contact office for sooner follow up if symptoms do not improve or worsen or seek emergency care

## 2022-04-28 DIAGNOSIS — H6981 Other specified disorders of Eustachian tube, right ear: Secondary | ICD-10-CM | POA: Diagnosis not present

## 2022-04-28 DIAGNOSIS — Z6831 Body mass index (BMI) 31.0-31.9, adult: Secondary | ICD-10-CM | POA: Diagnosis not present

## 2022-04-28 DIAGNOSIS — G894 Chronic pain syndrome: Secondary | ICD-10-CM | POA: Diagnosis not present

## 2022-04-28 DIAGNOSIS — H8081 Other otosclerosis, right ear: Secondary | ICD-10-CM | POA: Diagnosis not present

## 2022-06-16 DIAGNOSIS — E6609 Other obesity due to excess calories: Secondary | ICD-10-CM | POA: Diagnosis not present

## 2022-06-16 DIAGNOSIS — J449 Chronic obstructive pulmonary disease, unspecified: Secondary | ICD-10-CM | POA: Diagnosis not present

## 2022-06-16 DIAGNOSIS — I700 Atherosclerosis of aorta: Secondary | ICD-10-CM | POA: Diagnosis not present

## 2022-06-16 DIAGNOSIS — F330 Major depressive disorder, recurrent, mild: Secondary | ICD-10-CM | POA: Diagnosis not present

## 2022-06-16 DIAGNOSIS — I10 Essential (primary) hypertension: Secondary | ICD-10-CM | POA: Diagnosis not present

## 2022-06-16 DIAGNOSIS — N183 Chronic kidney disease, stage 3 unspecified: Secondary | ICD-10-CM | POA: Diagnosis not present

## 2022-06-16 DIAGNOSIS — M5416 Radiculopathy, lumbar region: Secondary | ICD-10-CM | POA: Diagnosis not present

## 2022-06-16 DIAGNOSIS — Z6831 Body mass index (BMI) 31.0-31.9, adult: Secondary | ICD-10-CM | POA: Diagnosis not present

## 2022-08-05 DIAGNOSIS — Z6831 Body mass index (BMI) 31.0-31.9, adult: Secondary | ICD-10-CM | POA: Diagnosis not present

## 2022-08-05 DIAGNOSIS — M12812 Other specific arthropathies, not elsewhere classified, left shoulder: Secondary | ICD-10-CM | POA: Diagnosis not present

## 2022-08-05 DIAGNOSIS — G894 Chronic pain syndrome: Secondary | ICD-10-CM | POA: Diagnosis not present

## 2022-08-05 DIAGNOSIS — J449 Chronic obstructive pulmonary disease, unspecified: Secondary | ICD-10-CM | POA: Diagnosis not present

## 2022-08-05 DIAGNOSIS — E6609 Other obesity due to excess calories: Secondary | ICD-10-CM | POA: Diagnosis not present

## 2022-08-05 DIAGNOSIS — R1030 Lower abdominal pain, unspecified: Secondary | ICD-10-CM | POA: Diagnosis not present

## 2022-08-05 DIAGNOSIS — I10 Essential (primary) hypertension: Secondary | ICD-10-CM | POA: Diagnosis not present

## 2022-08-05 DIAGNOSIS — N183 Chronic kidney disease, stage 3 unspecified: Secondary | ICD-10-CM | POA: Diagnosis not present

## 2022-08-05 DIAGNOSIS — M75102 Unspecified rotator cuff tear or rupture of left shoulder, not specified as traumatic: Secondary | ICD-10-CM | POA: Diagnosis not present

## 2022-08-05 DIAGNOSIS — I700 Atherosclerosis of aorta: Secondary | ICD-10-CM | POA: Diagnosis not present

## 2022-08-15 ENCOUNTER — Other Ambulatory Visit: Payer: Self-pay | Admitting: *Deleted

## 2022-08-15 DIAGNOSIS — K4091 Unilateral inguinal hernia, without obstruction or gangrene, recurrent: Secondary | ICD-10-CM

## 2022-08-18 ENCOUNTER — Ambulatory Visit: Payer: Medicare Other | Admitting: General Surgery

## 2022-08-21 DIAGNOSIS — J449 Chronic obstructive pulmonary disease, unspecified: Secondary | ICD-10-CM | POA: Diagnosis not present

## 2022-08-21 DIAGNOSIS — I10 Essential (primary) hypertension: Secondary | ICD-10-CM | POA: Diagnosis not present

## 2022-09-20 DIAGNOSIS — I10 Essential (primary) hypertension: Secondary | ICD-10-CM | POA: Diagnosis not present

## 2022-09-20 DIAGNOSIS — J449 Chronic obstructive pulmonary disease, unspecified: Secondary | ICD-10-CM | POA: Diagnosis not present

## 2022-09-29 DIAGNOSIS — L570 Actinic keratosis: Secondary | ICD-10-CM | POA: Diagnosis not present

## 2022-09-29 DIAGNOSIS — X32XXXD Exposure to sunlight, subsequent encounter: Secondary | ICD-10-CM | POA: Diagnosis not present

## 2022-10-04 ENCOUNTER — Encounter: Payer: Self-pay | Admitting: Cardiology

## 2022-10-04 ENCOUNTER — Ambulatory Visit: Payer: Medicare Other | Attending: Cardiology | Admitting: Cardiology

## 2022-10-04 VITALS — BP 138/72 | HR 65 | Ht 66.0 in | Wt 195.4 lb

## 2022-10-04 DIAGNOSIS — I080 Rheumatic disorders of both mitral and aortic valves: Secondary | ICD-10-CM

## 2022-10-04 DIAGNOSIS — R0602 Shortness of breath: Secondary | ICD-10-CM | POA: Diagnosis not present

## 2022-10-04 DIAGNOSIS — I10 Essential (primary) hypertension: Secondary | ICD-10-CM

## 2022-10-04 DIAGNOSIS — I2510 Atherosclerotic heart disease of native coronary artery without angina pectoris: Secondary | ICD-10-CM

## 2022-10-04 DIAGNOSIS — R0789 Other chest pain: Secondary | ICD-10-CM

## 2022-10-04 NOTE — Progress Notes (Signed)
Clinical Summary 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   - some tightness midchest, 2-3/10 in severity. Can occur at rest or with activity. Symptoms occur about 4 times a week. No other associated symptoms. Lasts 15-20 minutes. Not positional.Not related to food - can have some DOE with walking few blocks. - different from prior pains  CAD risk factors: tobacco x 50 years, HTN, HL     2. COPD - followed by pulmonary   3. Hyperlipidemia - 02/2022 TC 127 TG 143 HDL 44 LDL 58 - he is on simva 20 and fenofibrate   4. HTN - pcp recentlyt started new med - home bp's 110s-130s/70s-80s     5. 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 Reports being sedentary, has COPD as well.   6. OSA - he reports was told borderline on most recent check, not requiring cpap   Past Medical History:  Diagnosis Date   Allergic rhinitis    Anxiety and depression    Asthma    CAD (coronary artery disease)    Mild to moderate nonobstructive LAD disease 2006   Chronic low back pain    COPD (chronic obstructive pulmonary disease) (HCC)    Dyslipidemia    Essential hypertension    GERD (gastroesophageal reflux disease)    Hearing loss    Hemorrhoids    Hyperplastic colon polyp 12/28/10   IBS (irritable bowel syndrome)    Langerhan's cell histiocytosis (HCC)    Mitral regurgitation    OSA on CPAP    Tubular adenoma 12/28/10     Allergies  Allergen Reactions   Bee Venom Swelling     Current Outpatient Medications  Medication Sig Dispense Refill   HYDROcodone-acetaminophen (NORCO/VICODIN) 5-325 MG tablet Take 2 tablets by mouth every 6 (six) hours.     albuterol (VENTOLIN  HFA) 108 (90 Base) MCG/ACT inhaler Inhale 2 puffs into the lungs every 6 (six) hours as needed for wheezing or shortness of breath. 6.7 g 6   amLODipine (NORVASC) 5 MG tablet Take 5 mg by mouth. Take 2 tablets daily.     aspirin 81 MG tablet Take 81 mg by mouth daily.     cyclobenzaprine (FLEXERIL) 10 MG tablet Take 10 mg by mouth 3 (three) times daily.     fenofibrate (TRICOR) 145 MG tablet Take 145 mg by mouth at bedtime.     fish oil-omega-3 fatty acids 1000 MG capsule Take 1 g by mouth daily.     fluticasone (FLONASE) 50 MCG/ACT nasal spray Place 2 sprays into both nostrils daily as needed for allergies.     losartan (COZAAR) 100 MG tablet Take 1 tablet by mouth once daily 90 tablet 1   Multiple Vitamin (MULTIVITAMIN) capsule Take 1 capsule by mouth daily.     nitroGLYCERIN (NITROSTAT) 0.4 MG SL tablet Place 1 tablet (0.4 mg total) under the tongue every 5 (five) minutes as needed for chest pain. 25 tablet 3   omeprazole (PRILOSEC) 20 MG capsule Take 20 mg by mouth daily.   2   simvastatin (ZOCOR) 20 MG tablet TAKE 1 TABLET BY MOUTH AT BEDTIME .  APPOINTMENT REQUIRED FOR FUTURE REFILLS 15 tablet 0   tadalafil (CIALIS) 20 MG tablet Take 1 tablet (20 mg total) by mouth daily as needed for erectile dysfunction. 6 tablet 11   umeclidinium-vilanterol (ANORO ELLIPTA) 62.5-25 MCG/ACT AEPB Inhale 1 puff by mouth once daily 60 each 0   umeclidinium-vilanterol (ANORO ELLIPTA) 62.5-25 MCG/ACT AEPB Inhale 1 puff into the lungs daily. 14 each 2   No current facility-administered medications for this visit.     Past Surgical History:  Procedure Laterality Date   CHOLECYSTECTOMY     COLONOSCOPY  05/23/2005   friable anal canal, hyperplastic polyp   COLONOSCOPY  12/28/2010   Dr. Jena [name]- tubular adenoma, hyperplastic polyp   COLONOSCOPY N/A 01/16/2015   [name]: anal canal and internal hemorrhois. next surveillance colonoscopy 12/2019   COLONOSCOPY WITH PROPOFOL N/A 04/09/2020   [name]-normal, query  chronic appendicitis as cause of ongoing RLQ abd pain   EAR PINNA RECONSTRUCTION W/ RIB GRAFT     right   ESOPHAGOGASTRODUODENOSCOPY     multiple dilations, last EGD 2007 showed small hh, adenomatous appearing gastric mucosa in body but biopsies benign. SB bx negative for Celiac.   ESOPHAGOGASTRODUODENOSCOPY  12/28/2010   Dr. Jena [name]- normal esophagus s/p dilationpatchy erythema and erosions.   FOOT SURGERY     right   INGUINAL HERNIA REPAIR     left   INGUINAL HERNIA REPAIR Left 07/20/2018   Procedure: LEFT INDIRECT INGUINAL AND FEMORAL HERNIA REPAIR WITH MESH;  Surgeon: Robert Roers, MD;  Location: [facility];  Service: General;  Laterality: Left;   LUNG BIOPSY  05/24/2007   right   MALONEY DILATION  12/28/2010   Procedure: [name] DILATION;  Surgeon: Robert Ade, MD;  Location: [facility];  Service: Endoscopy;  Laterality: N/A;   TONSILLECTOMY     TRANSTHORACIC ECHOCARDIOGRAM  02/21/2011   EF =>55%, normal chamber size & function; mild mitral and aortic insuff, mild-mod tricuspid insuff; mild pulm htn with rsvp of     Allergies  Allergen Reactions   Bee Venom Swelling      Family History  Problem Relation Age of Onset   Heart disease Father 17       etoh/breathing problmes   COPD Father    Heart attack Maternal Grandfather    Diabetes Paternal Grandmother    COPD Paternal Grandmother    Heart Problems Paternal Grandfather    GI problems Mother    Hyperlipidemia Mother    COPD Mother    Heart attack Maternal Grandmother        also valvular problems   Heart attack Brother        stenting x3, also cancer   Skin cancer Brother    Hypertension Brother    Hypertension Sister    Colon cancer Neg Hx    Liver disease Neg Hx    Inflammatory bowel disease Neg Hx      Social History [name] reports that he has been smoking cigarettes. He started smoking about 44 years ago. He has a 52.00 pack-year smoking history. He has never used smokeless  tobacco. [name] reports no history of alcohol use.   Review of Systems CONSTITUTIONAL: No weight loss, fever, chills, weakness or fatigue.  HEENT: Eyes: No visual loss, blurred vision, double vision or yellow sclerae.No hearing loss, sneezing, congestion, runny nose or sore throat.  SKIN: No rash or itching.  CARDIOVASCULAR: per hpi RESPIRATORY: per hpi  GASTROINTESTINAL: No anorexia, nausea, vomiting or diarrhea. No abdominal  pain or blood.  GENITOURINARY: No burning on urination, no polyuria NEUROLOGICAL: No headache, dizziness, syncope, paralysis, ataxia, numbness or tingling in the extremities. No change in bowel or bladder control.  MUSCULOSKELETAL: No muscle, back pain, joint pain or stiffness.  LYMPHATICS: No enlarged nodes. No history of splenectomy.  PSYCHIATRIC: No history of depression or anxiety.  ENDOCRINOLOGIC: No reports of sweating, cold or heat intolerance. No polyuria or polydipsia.  Marland Kitchen   Physical Examination Today's Vitals   10/04/22 1034  BP: 138/72  Pulse: 65  SpO2: 97%  Weight: 195 lb 6.4 oz (88.6 kg)  Height: 5\' 6"  (1.676 m)   Body mass index is 31.54 kg/m.  Gen: resting comfortably, no acute distress HEENT: no scleral icterus, pupils equal round and reactive, no palptable cervical adenopathy,  CV: RRR, no m/rg, no jvd Resp: Clear to auscultation bilaterally GI: abdomen is soft, non-tender, non-distended, normal bowel sounds, no hepatosplenomegaly MSK: extremities are warm, no edema.  Skin: warm, no rash Neuro:  no focal deficits Psych: appropriate affect   Diagnostic Studies  Lexiscan Myoview 04/28/2015: The left ventricular ejection fraction is normal (55-65%). Nuclear stress EF: 63%. There was no ST segment deviation noted during stress. The study is normal.   Normal stress nuclear study with a small, mild, fixed inferior septal defect consistent with thinning; no ischemia; EF 63 with normal wall motion.   Chest CT  12/04/2018: FINDINGS: Cardiovascular: The heart is normal in size. No pericardial effusion.   No evidence thoracic aortic aneurysm. Mild atherosclerotic calcification of the aortic arch.   Mild three-vessel coronary atherosclerosis.   Mediastinum/Nodes: No suspicious mediastinal lymphadenopathy.   Visualized thyroid is unremarkable.   Lungs/Pleura: Mild centrilobular emphysematous changes, upper lung predominant.   Mild subpleural reticulation/fibrosis in the lungs bilaterally, suggesting superimposed chronic interstitial lung disease.   No focal consolidation.   Numerous scattered bilateral pulmonary nodules, measuring up to 6.2 mm, most of which are unchanged. A few bilateral pulmonary nodules are no longer visualized. A few new pulmonary nodules measuring less than 4 mm were not conspicuous on the prior. Overall, these findings are can be considered benign.   No pleural effusion or pneumothorax.   Upper Abdomen: Visualized upper abdomen is grossly unremarkable, noting prior cholecystectomy and a benign left adrenal adenoma.   Musculoskeletal: Degenerative changes of the visualized thoracolumbar spine.   IMPRESSION: Lung-RADS 2, benign appearance or behavior. Continue annual screening with low-dose chest CT without contrast in 12 months.   Aortic Atherosclerosis (ICD10-I70.0) and Emphysema (ICD10-J43.9).     Assessment and Plan  1.CAD/Chest pain - mild CAD by prior cath in 2006 - recent chest pain symptoms - plan for exercise nuclear stress after echo obtained  2. SOB - echo 2012 mild AI, MR, mild to mod TR - repeat echo for valve surveillance and also in setting of SOB  3. HTN - closely followed by pcp, home numbers at goal -continue current meds  4.Hyperlipidemia - continue current meds, he is at goal    Plan for exercise nuclear stress after echo is complete.   F/u 6 months  Antoine Poche, M.D.

## 2022-10-04 NOTE — Patient Instructions (Signed)
Medication Instructions:  Continue all current medications.  Labwork: none  Testing/Procedures: Your physician has requested that you have an echocardiogram. Echocardiography is a painless test that uses sound waves to create images of your heart. It provides your doctor with information about the size and shape of your heart and how well your heart's chambers and valves are working. This procedure takes approximately one hour. There are no restrictions for this procedure. Please do NOT wear cologne, perfume, aftershave, or lotions (deodorant is allowed). Please arrive 15 minutes prior to your appointment time. Office will contact with results via phone, letter or mychart.     Follow-Up: 6 months   Any Other Special Instructions Will Be Listed Below (If Applicable).   If you need a refill on your cardiac medications before your next appointment, please call your pharmacy.  

## 2022-10-06 DIAGNOSIS — E782 Mixed hyperlipidemia: Secondary | ICD-10-CM | POA: Diagnosis not present

## 2022-10-06 DIAGNOSIS — J449 Chronic obstructive pulmonary disease, unspecified: Secondary | ICD-10-CM | POA: Diagnosis not present

## 2022-10-06 DIAGNOSIS — K219 Gastro-esophageal reflux disease without esophagitis: Secondary | ICD-10-CM | POA: Diagnosis not present

## 2022-10-06 DIAGNOSIS — E6609 Other obesity due to excess calories: Secondary | ICD-10-CM | POA: Diagnosis not present

## 2022-10-06 DIAGNOSIS — I10 Essential (primary) hypertension: Secondary | ICD-10-CM | POA: Diagnosis not present

## 2022-10-06 DIAGNOSIS — Z6831 Body mass index (BMI) 31.0-31.9, adult: Secondary | ICD-10-CM | POA: Diagnosis not present

## 2022-10-06 DIAGNOSIS — I700 Atherosclerosis of aorta: Secondary | ICD-10-CM | POA: Diagnosis not present

## 2022-10-19 DIAGNOSIS — Z6831 Body mass index (BMI) 31.0-31.9, adult: Secondary | ICD-10-CM | POA: Diagnosis not present

## 2022-10-19 DIAGNOSIS — E6609 Other obesity due to excess calories: Secondary | ICD-10-CM | POA: Diagnosis not present

## 2022-10-19 DIAGNOSIS — I700 Atherosclerosis of aorta: Secondary | ICD-10-CM | POA: Diagnosis not present

## 2022-10-19 DIAGNOSIS — M5416 Radiculopathy, lumbar region: Secondary | ICD-10-CM | POA: Diagnosis not present

## 2022-10-19 DIAGNOSIS — G894 Chronic pain syndrome: Secondary | ICD-10-CM | POA: Diagnosis not present

## 2022-10-19 DIAGNOSIS — K219 Gastro-esophageal reflux disease without esophagitis: Secondary | ICD-10-CM | POA: Diagnosis not present

## 2022-10-19 DIAGNOSIS — J449 Chronic obstructive pulmonary disease, unspecified: Secondary | ICD-10-CM | POA: Diagnosis not present

## 2022-10-19 DIAGNOSIS — M75102 Unspecified rotator cuff tear or rupture of left shoulder, not specified as traumatic: Secondary | ICD-10-CM | POA: Diagnosis not present

## 2022-10-19 DIAGNOSIS — I10 Essential (primary) hypertension: Secondary | ICD-10-CM | POA: Diagnosis not present

## 2022-11-03 ENCOUNTER — Ambulatory Visit: Payer: Medicare Other | Attending: Cardiology

## 2022-11-03 DIAGNOSIS — I080 Rheumatic disorders of both mitral and aortic valves: Secondary | ICD-10-CM | POA: Diagnosis not present

## 2022-11-03 DIAGNOSIS — R0602 Shortness of breath: Secondary | ICD-10-CM

## 2022-11-09 LAB — ECHOCARDIOGRAM COMPLETE
AR max vel: 2.12 cm2
AV Area VTI: 2.09 cm2
AV Area mean vel: 2.19 cm2
AV Mean grad: 8 mmHg
AV Peak grad: 15.2 mmHg
Ao pk vel: 1.95 m/s
Area-P 1/2: 2.36 cm2
S' Lateral: 3.6 cm

## 2022-11-20 DIAGNOSIS — I700 Atherosclerosis of aorta: Secondary | ICD-10-CM | POA: Diagnosis not present

## 2022-11-20 DIAGNOSIS — J449 Chronic obstructive pulmonary disease, unspecified: Secondary | ICD-10-CM | POA: Diagnosis not present

## 2022-11-20 DIAGNOSIS — I10 Essential (primary) hypertension: Secondary | ICD-10-CM | POA: Diagnosis not present

## 2022-11-28 DIAGNOSIS — Z6832 Body mass index (BMI) 32.0-32.9, adult: Secondary | ICD-10-CM | POA: Diagnosis not present

## 2022-11-28 DIAGNOSIS — I700 Atherosclerosis of aorta: Secondary | ICD-10-CM | POA: Diagnosis not present

## 2022-11-28 DIAGNOSIS — K219 Gastro-esophageal reflux disease without esophagitis: Secondary | ICD-10-CM | POA: Diagnosis not present

## 2022-11-28 DIAGNOSIS — N183 Chronic kidney disease, stage 3 unspecified: Secondary | ICD-10-CM | POA: Diagnosis not present

## 2022-11-28 DIAGNOSIS — W5503XA Scratched by cat, initial encounter: Secondary | ICD-10-CM | POA: Diagnosis not present

## 2022-11-28 DIAGNOSIS — M461 Sacroiliitis, not elsewhere classified: Secondary | ICD-10-CM | POA: Diagnosis not present

## 2022-11-28 DIAGNOSIS — E6609 Other obesity due to excess calories: Secondary | ICD-10-CM | POA: Diagnosis not present

## 2022-11-28 DIAGNOSIS — M75102 Unspecified rotator cuff tear or rupture of left shoulder, not specified as traumatic: Secondary | ICD-10-CM | POA: Diagnosis not present

## 2022-11-28 DIAGNOSIS — I10 Essential (primary) hypertension: Secondary | ICD-10-CM | POA: Diagnosis not present

## 2022-11-28 DIAGNOSIS — J449 Chronic obstructive pulmonary disease, unspecified: Secondary | ICD-10-CM | POA: Diagnosis not present

## 2022-11-28 DIAGNOSIS — M5416 Radiculopathy, lumbar region: Secondary | ICD-10-CM | POA: Diagnosis not present

## 2022-11-29 ENCOUNTER — Telehealth: Payer: Self-pay | Admitting: *Deleted

## 2022-11-29 ENCOUNTER — Telehealth: Payer: Self-pay | Admitting: Cardiology

## 2022-11-29 DIAGNOSIS — R0602 Shortness of breath: Secondary | ICD-10-CM

## 2022-11-29 NOTE — Telephone Encounter (Signed)
Already addressed

## 2022-11-29 NOTE — Telephone Encounter (Signed)
-----   Message from Antoine Poche, MD sent at 11/28/2022 10:32 AM EDT ----- Echo looks good, normal heart function. Can he get an exercise nuclear stress test for SOB please   Dominga Ferry MD

## 2022-11-29 NOTE — Telephone Encounter (Signed)
Patient calling in about his results. Please advise 

## 2022-11-29 NOTE — Telephone Encounter (Signed)
[name], LPN 05/28/1094  0:45 PM EDT Back to Top    Notified, copy to pcp.  He agrees to doing the stress test.

## 2022-12-01 ENCOUNTER — Telehealth: Payer: Self-pay | Admitting: Cardiology

## 2022-12-01 NOTE — Telephone Encounter (Signed)
Fluttering would suggest perhaps some extra heart beats or an abnormal heart rhythm, can he come for an EKG and vitals check. Pending findings may need to consider home heart monitor  Dominga Ferry MD

## 2022-12-01 NOTE — Telephone Encounter (Signed)
Called patient back with recommendations from Dr. Wyline Mood. Patient coming in tomorrow for Nurse Visit to have an EKG and vitals checked. Scheduled 12/02/2022 @ 9:00 patient verbalized understanding.

## 2022-12-01 NOTE — Telephone Encounter (Signed)
Pt states he's having fluttering all day long

## 2022-12-01 NOTE — Telephone Encounter (Signed)
Patient experiencing fluttering: Spoke to patient stated that this has been going on for 2 weeks now. Asking if he was or has had any chest pain or SOB he reports none. Has been taking his BP medications as well morning and evening. Checked his BP and HR while on the phone with him 158/90 HR-68 @ 2:30. Waited about about 10 minutes for him to take again 2:40 BP 138/82 HR 63. He reported before I called him back did have some fluttering. Patient is scheduled for myoview stress test on 12/08/2022.  Please Advise

## 2022-12-02 ENCOUNTER — Ambulatory Visit: Payer: Medicare Other | Attending: Internal Medicine

## 2022-12-02 VITALS — BP 142/82 | HR 65 | Ht 66.0 in | Wt 202.0 lb

## 2022-12-02 DIAGNOSIS — I498 Other specified cardiac arrhythmias: Secondary | ICD-10-CM

## 2022-12-02 NOTE — Progress Notes (Signed)
Normal EKG. Is there a way to check if insurance would cover a monitor for him  Dominga Ferry MD

## 2022-12-02 NOTE — Progress Notes (Signed)
Pt here for EKG and vital check from previous call of heart fluttering on 12/01/2022.  Pt reported some fluttering before visit today. Stated that it can happen when he is doing nothing at all. Checked vitals 142/82 HR-65.  EKG completed and medications reviewed. He has taken morning medications. Reported that he wasn't taking Indapamide anymore. Advised that provider recommended that he may order heart monitor. Patient stated that his insurance will not cover it and that one was ordered years ago and he sent it back.  Provider notified of nurse vist

## 2022-12-05 ENCOUNTER — Telehealth: Payer: Self-pay

## 2022-12-05 NOTE — Telephone Encounter (Signed)
Called patient to let him know his EKG was normal and that Dr. Wyline Mood advised him to wear a ZIO XT monitor for 7 days. Called [employer] to verify that his insurance would cover-Spoke with Aurther Loft she stated that patient has a Medicare Advantage plan and that he would have an estimated co-pay of $25 [phone].  Patient agreed to wear monitor and will come by the office on Friday to have placed and enrolled

## 2022-12-08 ENCOUNTER — Encounter (HOSPITAL_COMMUNITY)
Admission: RE | Admit: 2022-12-08 | Discharge: 2022-12-08 | Disposition: A | Discharge status: Home / Self Care | Payer: Medicare Other | Source: Ambulatory Visit | Attending: Cardiology | Admitting: Cardiology

## 2022-12-08 ENCOUNTER — Ambulatory Visit (HOSPITAL_COMMUNITY)
Admission: RE | Admit: 2022-12-08 | Discharge: 2022-12-08 | Disposition: A | Discharge status: Home / Self Care | Payer: Medicare Other | Source: Ambulatory Visit | Attending: Cardiology | Admitting: Cardiology

## 2022-12-08 ENCOUNTER — Encounter (HOSPITAL_COMMUNITY): Payer: Self-pay

## 2022-12-08 DIAGNOSIS — R0602 Shortness of breath: Secondary | ICD-10-CM | POA: Diagnosis not present

## 2022-12-08 LAB — NM MYOCAR MULTI W/SPECT W/WALL MOTION / EF
Angina Index: 0
Base ST Depression (mm): 0 mm
Duke Treadmill Score: 4
Estimated workload: 6.6
Exercise duration (min): 3 min
Exercise duration (sec): 50 s
LV dias vol: 92 mL (ref 62–150)
LV sys vol: 32 mL
MPHR: 151 {beats}/min
Nuc Stress EF: 65 %
Peak HR: 105 {beats}/min
Percent HR: 69 %
RATE: 0.7
RPE: 14
Rest HR: 59 {beats}/min
Rest Nuclear Isotope Dose: 10 mCi
SDS: 1
SRS: 0
SSS: 1
ST Depression (mm): 0 mm
Stress Nuclear Isotope Dose: 32.5 mCi
TID: 0.99

## 2022-12-08 MED ORDER — SODIUM CHLORIDE FLUSH 0.9 % IV SOLN
INTRAVENOUS | Status: AC
  Administered 2022-12-08: 10 mL via INTRAVENOUS
  Filled 2022-12-08: qty 10

## 2022-12-08 MED ORDER — TECHNETIUM TC 99M TETROFOSMIN IV KIT
10.0000 | PACK | Freq: Once | INTRAVENOUS | Status: AC | PRN
  Administered 2022-12-08: 10 via INTRAVENOUS

## 2022-12-08 MED ORDER — REGADENOSON 0.4 MG/5ML IV SOLN
INTRAVENOUS | Status: AC
  Administered 2022-12-08: 0.4 mg via INTRAVENOUS
  Filled 2022-12-08: qty 5

## 2022-12-08 MED ORDER — TECHNETIUM TC 99M TETROFOSMIN IV KIT
30.0000 | PACK | Freq: Once | INTRAVENOUS | Status: AC | PRN
  Administered 2022-12-08: 32.5 via INTRAVENOUS

## 2022-12-09 ENCOUNTER — Telehealth: Payer: Self-pay | Admitting: Cardiology

## 2022-12-09 ENCOUNTER — Ambulatory Visit: Payer: Medicare Other

## 2022-12-09 ENCOUNTER — Other Ambulatory Visit: Payer: Self-pay | Admitting: Cardiology

## 2022-12-09 DIAGNOSIS — I4891 Unspecified atrial fibrillation: Secondary | ICD-10-CM

## 2022-12-09 DIAGNOSIS — I4892 Unspecified atrial flutter: Secondary | ICD-10-CM | POA: Diagnosis not present

## 2022-12-09 NOTE — Telephone Encounter (Signed)
enrolled Zio XT on 12/09/2022/Branch

## 2022-12-09 NOTE — Progress Notes (Signed)
Patient arrived to have monitor placed. Went over instructions. Patient verbalized understanding

## 2022-12-13 ENCOUNTER — Encounter: Payer: Self-pay | Admitting: *Deleted

## 2022-12-14 NOTE — Telephone Encounter (Signed)
This type of stress test would just tell us he does not have any significant blockages, meaning there are no blockages above 70%. The prior 40% blockage may have progressed but has not reached that 70% mark of significance. The monitor will help tell us more about the extra heart beats, during the stress test he had some PVCs which are extra heart beats that can originate from the bottom of the heart. The monitor will let us know how often these are happening  Dominga Ferry MD

## 2023-01-05 DIAGNOSIS — E6609 Other obesity due to excess calories: Secondary | ICD-10-CM | POA: Diagnosis not present

## 2023-01-05 DIAGNOSIS — G894 Chronic pain syndrome: Secondary | ICD-10-CM | POA: Diagnosis not present

## 2023-01-05 DIAGNOSIS — M461 Sacroiliitis, not elsewhere classified: Secondary | ICD-10-CM | POA: Diagnosis not present

## 2023-01-05 DIAGNOSIS — Z6832 Body mass index (BMI) 32.0-32.9, adult: Secondary | ICD-10-CM | POA: Diagnosis not present

## 2023-01-09 ENCOUNTER — Telehealth: Payer: Self-pay | Admitting: Cardiology

## 2023-01-09 ENCOUNTER — Telehealth: Payer: Self-pay

## 2023-01-09 MED ORDER — BISOPROLOL FUMARATE 5 MG PO TABS: 2.5000 mg | ORAL_TABLET | Freq: Every day | ORAL | 1 refills | Status: DC

## 2023-01-09 NOTE — Telephone Encounter (Signed)
 Patient informed and verbalized understanding. Sent copy to PCP

## 2023-01-09 NOTE — Telephone Encounter (Signed)
Patient called for his zio monitor results.

## 2023-01-09 NOTE — Telephone Encounter (Signed)
-----   Message from Dina Rich sent at 01/09/2023  2:37 PM EDT ----- Monitor shows some extra heart beats at times, sometimes can have a few in a row at a fast rate, this is called an SVT. None of these findings are dangerous but can cause the feelling of the heart fluttering or racing. Please start bisoprolol 2.5mg  daily to help decrease these extra beats and any symptoms, keep [place] updated on symptoms  JBranch MD

## 2023-01-21 DIAGNOSIS — J449 Chronic obstructive pulmonary disease, unspecified: Secondary | ICD-10-CM | POA: Diagnosis not present

## 2023-01-21 DIAGNOSIS — I10 Essential (primary) hypertension: Secondary | ICD-10-CM | POA: Diagnosis not present

## 2023-01-21 DIAGNOSIS — I700 Atherosclerosis of aorta: Secondary | ICD-10-CM | POA: Diagnosis not present

## 2023-01-21 DIAGNOSIS — I1310 Hypertensive heart and chronic kidney disease without heart failure, with stage 1 through stage 4 chronic kidney disease, or unspecified chronic kidney disease: Secondary | ICD-10-CM | POA: Diagnosis not present

## 2023-02-07 DIAGNOSIS — E6609 Other obesity due to excess calories: Secondary | ICD-10-CM | POA: Diagnosis not present

## 2023-02-07 DIAGNOSIS — I10 Essential (primary) hypertension: Secondary | ICD-10-CM | POA: Diagnosis not present

## 2023-02-07 DIAGNOSIS — M461 Sacroiliitis, not elsewhere classified: Secondary | ICD-10-CM | POA: Diagnosis not present

## 2023-02-07 DIAGNOSIS — N183 Chronic kidney disease, stage 3 unspecified: Secondary | ICD-10-CM | POA: Diagnosis not present

## 2023-02-07 DIAGNOSIS — I1310 Hypertensive heart and chronic kidney disease without heart failure, with stage 1 through stage 4 chronic kidney disease, or unspecified chronic kidney disease: Secondary | ICD-10-CM | POA: Diagnosis not present

## 2023-02-07 DIAGNOSIS — J449 Chronic obstructive pulmonary disease, unspecified: Secondary | ICD-10-CM | POA: Diagnosis not present

## 2023-02-07 DIAGNOSIS — Z6832 Body mass index (BMI) 32.0-32.9, adult: Secondary | ICD-10-CM | POA: Diagnosis not present

## 2023-03-07 DIAGNOSIS — K219 Gastro-esophageal reflux disease without esophagitis: Secondary | ICD-10-CM | POA: Diagnosis not present

## 2023-03-07 DIAGNOSIS — E559 Vitamin D deficiency, unspecified: Secondary | ICD-10-CM | POA: Diagnosis not present

## 2023-03-07 DIAGNOSIS — I10 Essential (primary) hypertension: Secondary | ICD-10-CM | POA: Diagnosis not present

## 2023-03-07 DIAGNOSIS — J449 Chronic obstructive pulmonary disease, unspecified: Secondary | ICD-10-CM | POA: Diagnosis not present

## 2023-03-07 DIAGNOSIS — N183 Chronic kidney disease, stage 3 unspecified: Secondary | ICD-10-CM | POA: Diagnosis not present

## 2023-03-07 DIAGNOSIS — Z1331 Encounter for screening for depression: Secondary | ICD-10-CM | POA: Diagnosis not present

## 2023-03-07 DIAGNOSIS — D518 Other vitamin B12 deficiency anemias: Secondary | ICD-10-CM | POA: Diagnosis not present

## 2023-03-07 DIAGNOSIS — Z6832 Body mass index (BMI) 32.0-32.9, adult: Secondary | ICD-10-CM | POA: Diagnosis not present

## 2023-03-07 DIAGNOSIS — Z125 Encounter for screening for malignant neoplasm of prostate: Secondary | ICD-10-CM | POA: Diagnosis not present

## 2023-03-07 DIAGNOSIS — Z23 Encounter for immunization: Secondary | ICD-10-CM | POA: Diagnosis not present

## 2023-03-07 DIAGNOSIS — I1310 Hypertensive heart and chronic kidney disease without heart failure, with stage 1 through stage 4 chronic kidney disease, or unspecified chronic kidney disease: Secondary | ICD-10-CM | POA: Diagnosis not present

## 2023-03-07 DIAGNOSIS — L739 Follicular disorder, unspecified: Secondary | ICD-10-CM | POA: Diagnosis not present

## 2023-03-07 DIAGNOSIS — M461 Sacroiliitis, not elsewhere classified: Secondary | ICD-10-CM | POA: Diagnosis not present

## 2023-03-07 DIAGNOSIS — G9332 Myalgic encephalomyelitis/chronic fatigue syndrome: Secondary | ICD-10-CM | POA: Diagnosis not present

## 2023-03-07 DIAGNOSIS — E6609 Other obesity due to excess calories: Secondary | ICD-10-CM | POA: Diagnosis not present

## 2023-03-07 DIAGNOSIS — Z0001 Encounter for general adult medical examination with abnormal findings: Secondary | ICD-10-CM | POA: Diagnosis not present

## 2023-03-31 ENCOUNTER — Ambulatory Visit (HOSPITAL_COMMUNITY)
Admission: RE | Admit: 2023-03-31 | Discharge: 2023-03-31 | Disposition: A | Discharge status: Home / Self Care | Payer: Medicare Other | Source: Ambulatory Visit | Attending: Acute Care | Admitting: Acute Care

## 2023-03-31 DIAGNOSIS — F17210 Nicotine dependence, cigarettes, uncomplicated: Secondary | ICD-10-CM | POA: Diagnosis not present

## 2023-03-31 DIAGNOSIS — Z87891 Personal history of nicotine dependence: Secondary | ICD-10-CM | POA: Diagnosis not present

## 2023-03-31 DIAGNOSIS — Z122 Encounter for screening for malignant neoplasm of respiratory organs: Secondary | ICD-10-CM | POA: Diagnosis not present

## 2023-04-12 ENCOUNTER — Ambulatory Visit: Payer: Medicare Other | Attending: Cardiology | Admitting: Cardiology

## 2023-04-12 ENCOUNTER — Encounter: Payer: Self-pay | Admitting: Cardiology

## 2023-04-12 VITALS — BP 122/64 | HR 60 | Ht 67.0 in | Wt 200.8 lb

## 2023-04-12 DIAGNOSIS — R0602 Shortness of breath: Secondary | ICD-10-CM | POA: Diagnosis not present

## 2023-04-12 DIAGNOSIS — R002 Palpitations: Secondary | ICD-10-CM | POA: Diagnosis not present

## 2023-04-12 DIAGNOSIS — I2510 Atherosclerotic heart disease of native coronary artery without angina pectoris: Secondary | ICD-10-CM

## 2023-04-12 DIAGNOSIS — E782 Mixed hyperlipidemia: Secondary | ICD-10-CM

## 2023-04-12 DIAGNOSIS — I10 Essential (primary) hypertension: Secondary | ICD-10-CM

## 2023-04-12 NOTE — Patient Instructions (Signed)
Medication Instructions:  Your physician recommends that you continue on your current medications as directed. Please refer to the Current Medication list given to you today.   Labwork: None  Testing/Procedures: Your physician has recommended that you have a pulmonary function test. Pulmonary Function Tests are a group of tests that measure how well air moves in and out of your lungs.   Follow-Up: Your physician recommends that you schedule a follow-up appointment in: 6 months  Any Other Special Instructions Will Be Listed Below (If Applicable). Thank you for choosing [facility]!      If you need a refill on your cardiac medications before your next appointment, please call your pharmacy.

## 2023-04-12 NOTE — Progress Notes (Signed)
Clinical Summary [name] is a 72 y.o.male seen today for follow up of the following medical problems.   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     10/2022 echo: LVEF 55-60%, no WMAs, grade I dd 11/2022 nuclear stress: Patient unable to achieve target heart rate with Bruce protocol. He experienced dyspnea and EKG showed ventricular bigeminy. Switched to [employer] No ischemia by perfusion imaging.   - chronic SOB, some wheezing at times.  - no recurrent chest pains.     2. COPD - followed by pulmonary - 2015 PFTs mild airflow limitation, improves with bronchodilators  -still smoking    3. Hyperlipidemia - 02/2022 TC 127 TG 143 HDL 44 LDL 58 - he is on simva 20 and fenofibrate - 02/2023 TC 126 TG 122 HDL 40 LDL 64     4. 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 Reports being sedentary, has COPD as well.   10/2022 echo: LVEF 55-60%, grade I dd, trivial MR, no AI   6. OSA - he reports was told borderline on most recent check, not requiring cpap  7.PVCs - 12/2022 monitor: rare PACs, 11 runs SVT longest 11 beats. Rare ventricular ectopy - can have some palpitations, short in duration - drinks 2-3 pots of coffee per day. High stress at home taking care of his mom  His son [name] is also a patient of mine.    Past Medical History:  Diagnosis Date   Allergic rhinitis    Anxiety and depression    Asthma    CAD (coronary artery disease)    Mild to moderate nonobstructive LAD disease 2006   Chronic low back pain    COPD (chronic obstructive pulmonary disease) (HCC)    Dyslipidemia    Essential hypertension    GERD (gastroesophageal reflux disease)    Hearing loss    Hemorrhoids    Hyperplastic colon polyp 12/28/10   IBS  (irritable bowel syndrome)    Langerhan's cell histiocytosis (HCC)    Mitral regurgitation    OSA on CPAP    Tubular adenoma 12/28/10     Allergies  Allergen Reactions   Bee Venom Swelling     Current Outpatient Medications  Medication Sig Dispense Refill   albuterol (VENTOLIN HFA) 108 (90 Base) MCG/ACT inhaler Inhale 2 puffs into the lungs every 6 (six) hours as needed for wheezing or shortness of breath. 6.7 g 6   amLODipine (NORVASC) 10 MG tablet Take 10 mg by mouth daily.     aspirin 81 MG tablet Take 81 mg by mouth daily.     bisoprolol (ZEBETA) 5 MG tablet Take 0.5 tablets (2.5 mg total) by mouth daily. 135 tablet 1   fenofibrate (TRICOR) 145 MG tablet Take 145 mg by mouth at bedtime.     fish oil-omega-3 fatty acids 1000 MG capsule Take 1 g by mouth daily.     HYDROcodone-acetaminophen (NORCO/VICODIN) 5-325 MG tablet Take 2 tablets by mouth every 6 (six) hours.     indapamide (LOZOL) 1.25 MG tablet Take 1.25 mg by mouth daily. (Patient not taking: Reported on 12/02/2022)     losartan (COZAAR) 100 MG tablet Take 1 tablet by mouth  once daily 90 tablet 1   Multiple Vitamin (MULTIVITAMIN) capsule Take 1 capsule by mouth daily.     nitroGLYCERIN (NITROSTAT) 0.4 MG SL tablet Place 1 tablet (0.4 mg total) under the tongue every 5 (five) minutes as needed for chest pain. 25 tablet 3   omeprazole (PRILOSEC) 20 MG capsule Take 20 mg by mouth daily.   2   simvastatin (ZOCOR) 20 MG tablet TAKE 1 TABLET BY MOUTH AT BEDTIME . APPOINTMENT REQUIRED FOR FUTURE REFILLS 15 tablet 0   tadalafil (CIALIS) 20 MG tablet Take 1 tablet (20 mg total) by mouth daily as needed for erectile dysfunction. 6 tablet 11   No current facility-administered medications for this visit.     Past Surgical History:  Procedure Laterality Date   CHOLECYSTECTOMY     COLONOSCOPY  05/23/2005   friable anal canal, hyperplastic polyp   COLONOSCOPY  12/28/2010   Dr. Jena Gauss- tubular adenoma, hyperplastic polyp    COLONOSCOPY N/A 01/16/2015   Rourk: anal canal and internal hemorrhois. next surveillance colonoscopy 12/2019   COLONOSCOPY WITH PROPOFOL N/A 04/09/2020   Rourk-normal, query chronic appendicitis as cause of ongoing RLQ abd pain   EAR PINNA RECONSTRUCTION W/ RIB GRAFT     right   ESOPHAGOGASTRODUODENOSCOPY     multiple dilations, last EGD 2007 showed small hh, adenomatous appearing gastric mucosa in body but biopsies benign. SB bx negative for Celiac.   ESOPHAGOGASTRODUODENOSCOPY  12/28/2010   Dr. Jena Gauss- normal esophagus s/p dilationpatchy erythema and erosions.   FOOT SURGERY     right   INGUINAL HERNIA REPAIR     left   INGUINAL HERNIA REPAIR Left 07/20/2018   Procedure: LEFT INDIRECT INGUINAL AND FEMORAL HERNIA REPAIR WITH MESH;  Surgeon: Lucretia Roers, MD;  Location: [facility];  Service: General;  Laterality: Left;   LUNG BIOPSY  05/24/2007   right   MALONEY DILATION  12/28/2010   Procedure: Elease Hashimoto DILATION;  Surgeon: Corbin Ade, MD;  Location: [facility];  Service: Endoscopy;  Laterality: N/A;   TONSILLECTOMY     TRANSTHORACIC ECHOCARDIOGRAM  02/21/2011   EF =>55%, normal chamber size & function; mild mitral and aortic insuff, mild-mod tricuspid insuff; mild pulm htn with rsvp of     Allergies  Allergen Reactions   Bee Venom Swelling      Family History  Problem Relation Age of Onset   Heart disease Father 21       etoh/breathing problmes   COPD Father    Heart attack Maternal Grandfather    Diabetes Paternal Grandmother    COPD Paternal Grandmother    Heart Problems Paternal Grandfather    GI problems Mother    Hyperlipidemia Mother    COPD Mother    Heart attack Maternal Grandmother        also valvular problems   Heart attack Brother        stenting x3, also cancer   Skin cancer Brother    Hypertension Brother    Hypertension Sister    Colon cancer Neg Hx    Liver disease Neg Hx    Inflammatory bowel disease Neg Hx      Social  History [name] reports that he has been smoking cigarettes. He started smoking about 44 years ago. He has a 52 pack-year smoking history. He has never used smokeless tobacco. [name] reports no history of alcohol use.   Review of Systems CONSTITUTIONAL: No weight loss, fever, chills, weakness or fatigue.  HEENT:  Eyes: No visual loss, blurred vision, double vision or yellow sclerae.No hearing loss, sneezing, congestion, runny nose or sore throat.  SKIN: No rash or itching.  CARDIOVASCULAR: per hpi RESPIRATORY: No shortness of breath, cough or sputum.  GASTROINTESTINAL: No anorexia, nausea, vomiting or diarrhea. No abdominal pain or blood.  GENITOURINARY: No burning on urination, no polyuria NEUROLOGICAL: No headache, dizziness, syncope, paralysis, ataxia, numbness or tingling in the extremities. No change in bowel or bladder control.  MUSCULOSKELETAL: No muscle, back pain, joint pain or stiffness.  LYMPHATICS: No enlarged nodes. No history of splenectomy.  PSYCHIATRIC: No history of depression or anxiety.  ENDOCRINOLOGIC: No reports of sweating, cold or heat intolerance. No polyuria or polydipsia.  Marland Kitchen   Physical Examination Today's Vitals   04/12/23 0937  BP: 122/64  Pulse: 60  SpO2: 95%  Weight: 200 lb 12.8 oz (91.1 kg)  Height: 5\' 7"  (1.702 m)   Body mass index is 31.45 kg/m.  Gen: resting comfortably, no acute distress HEENT: no scleral icterus, pupils equal round and reactive, no palptable cervical adenopathy,  CV: RRR, no m/rg, no jvd Resp: Clear to auscultation bilaterally GI: abdomen is soft, non-tender, non-distended, normal bowel sounds, no hepatosplenomegaly MSK: extremities are warm, no edema.  Skin: warm, no rash Neuro:  no focal deficits Psych: appropriate affect   Diagnostic Studies  Lexiscan Myoview 04/28/2015: The left ventricular ejection fraction is normal (55-65%). Nuclear stress EF: 63%. There was no ST segment deviation noted during  stress. The study is normal.   Normal stress nuclear study with a small, mild, fixed inferior septal defect consistent with thinning; no ischemia; EF 63 with normal wall motion.   Chest CT 12/04/2018: FINDINGS: Cardiovascular: The heart is normal in size. No pericardial effusion.   No evidence thoracic aortic aneurysm. Mild atherosclerotic calcification of the aortic arch.   Mild three-vessel coronary atherosclerosis.   Mediastinum/Nodes: No suspicious mediastinal lymphadenopathy.   Visualized thyroid is unremarkable.   Lungs/Pleura: Mild centrilobular emphysematous changes, upper lung predominant.   Mild subpleural reticulation/fibrosis in the lungs bilaterally, suggesting superimposed chronic interstitial lung disease.   No focal consolidation.   Numerous scattered bilateral pulmonary nodules, measuring up to 6.2 mm, most of which are unchanged. A few bilateral pulmonary nodules are no longer visualized. A few new pulmonary nodules measuring less than 4 mm were not conspicuous on the prior. Overall, these findings are can be considered benign.   No pleural effusion or pneumothorax.   Upper Abdomen: Visualized upper abdomen is grossly unremarkable, noting prior cholecystectomy and a benign left adrenal adenoma.   Musculoskeletal: Degenerative changes of the visualized thoracolumbar spine.   IMPRESSION: Lung-RADS 2, benign appearance or behavior. Continue annual screening with low-dose chest CT without contrast in 12 months.   Aortic Atherosclerosis (ICD10-I70.0) and Emphysema (ICD10-J43.9).   10/2022 echo 1. Left ventricular ejection fraction, by estimation, is 55 to 60%. The  left ventricle has normal function. The left ventricle has no regional  wall motion abnormalities. Left ventricular diastolic parameters are  consistent with Grade I diastolic  dysfunction (impaired relaxation).   2. Right ventricular systolic function is normal. The right ventricular  size  is mildly enlarged. There is normal pulmonary artery systolic  pressure.   3. Right atrial size was mildly dilated.   4. The mitral valve is grossly normal. Trivial mitral valve  regurgitation. No evidence of mitral stenosis.   5. The aortic valve was not well visualized. Aortic valve regurgitation  is not visualized. No aortic stenosis is  present.   6. The inferior vena cava is normal in size with greater than 50%  respiratory variability, suggesting right atrial pressure of 3 mmHg.    11/2022 nuclear stress Patient unable to achieve target heart rate with Bruce protocol. He experienced dyspnea and EKG showed ventricular bigeminy. Switched to [employer].   Stress EKG showed frequent PVCs and no ischemia.   LV perfusion is normal. There is no evidence of ischemia. There is no evidence of infarction.   Left ventricular function is normal. Nuclear stress EF: 65%.   The study is normal. The study is low risk.    Assessment and Plan   1.CAD/Chest pain - mild CAD by prior cath in 2006 - recent nuclear stress test was benign - chest pains have improved, continue to monitor at this time.    2. SOB - recent benign echo and nuclear stress - long ongoing smoking history , suspect symptoms are related to progresion of his COPD - last PFTs in 2015, will update test. May benefit form adjustements in regimen or pulmonary referral.    3. HTN - at goal, continue curren tmeds   4.Hyperlipidemia - at goal, continue current meds  5. Palpitations - recent monitor with rare benign ectopy - continue bisoprolol - very high caffeine in take, 2-3 pots of coffee daily. Discussed weaning caffeine intake.     Antoine Poche, M.D.

## 2023-04-25 DIAGNOSIS — Z6832 Body mass index (BMI) 32.0-32.9, adult: Secondary | ICD-10-CM | POA: Diagnosis not present

## 2023-04-25 DIAGNOSIS — J40 Bronchitis, not specified as acute or chronic: Secondary | ICD-10-CM | POA: Diagnosis not present

## 2023-04-25 DIAGNOSIS — E6609 Other obesity due to excess calories: Secondary | ICD-10-CM | POA: Diagnosis not present

## 2023-04-25 DIAGNOSIS — R6889 Other general symptoms and signs: Secondary | ICD-10-CM | POA: Diagnosis not present

## 2023-04-25 DIAGNOSIS — Z20828 Contact with and (suspected) exposure to other viral communicable diseases: Secondary | ICD-10-CM | POA: Diagnosis not present

## 2023-04-27 DIAGNOSIS — M461 Sacroiliitis, not elsewhere classified: Secondary | ICD-10-CM | POA: Diagnosis not present

## 2023-04-27 DIAGNOSIS — G894 Chronic pain syndrome: Secondary | ICD-10-CM | POA: Diagnosis not present

## 2023-04-27 DIAGNOSIS — E6609 Other obesity due to excess calories: Secondary | ICD-10-CM | POA: Diagnosis not present

## 2023-04-27 DIAGNOSIS — Z6832 Body mass index (BMI) 32.0-32.9, adult: Secondary | ICD-10-CM | POA: Diagnosis not present

## 2023-04-27 DIAGNOSIS — J40 Bronchitis, not specified as acute or chronic: Secondary | ICD-10-CM | POA: Diagnosis not present

## 2023-04-28 ENCOUNTER — Other Ambulatory Visit: Payer: Self-pay

## 2023-04-28 ENCOUNTER — Other Ambulatory Visit: Payer: Self-pay | Admitting: Acute Care

## 2023-04-28 DIAGNOSIS — Z87891 Personal history of nicotine dependence: Secondary | ICD-10-CM

## 2023-04-28 DIAGNOSIS — Z122 Encounter for screening for malignant neoplasm of respiratory organs: Secondary | ICD-10-CM

## 2023-04-28 DIAGNOSIS — F17210 Nicotine dependence, cigarettes, uncomplicated: Secondary | ICD-10-CM

## 2023-05-02 DIAGNOSIS — E6609 Other obesity due to excess calories: Secondary | ICD-10-CM | POA: Diagnosis not present

## 2023-05-02 DIAGNOSIS — M12812 Other specific arthropathies, not elsewhere classified, left shoulder: Secondary | ICD-10-CM | POA: Diagnosis not present

## 2023-05-02 DIAGNOSIS — M461 Sacroiliitis, not elsewhere classified: Secondary | ICD-10-CM | POA: Diagnosis not present

## 2023-05-02 DIAGNOSIS — J449 Chronic obstructive pulmonary disease, unspecified: Secondary | ICD-10-CM | POA: Diagnosis not present

## 2023-05-02 DIAGNOSIS — I1310 Hypertensive heart and chronic kidney disease without heart failure, with stage 1 through stage 4 chronic kidney disease, or unspecified chronic kidney disease: Secondary | ICD-10-CM | POA: Diagnosis not present

## 2023-05-02 DIAGNOSIS — Z6831 Body mass index (BMI) 31.0-31.9, adult: Secondary | ICD-10-CM | POA: Diagnosis not present

## 2023-05-02 DIAGNOSIS — M5416 Radiculopathy, lumbar region: Secondary | ICD-10-CM | POA: Diagnosis not present

## 2023-05-02 DIAGNOSIS — I10 Essential (primary) hypertension: Secondary | ICD-10-CM | POA: Diagnosis not present

## 2023-05-02 DIAGNOSIS — N183 Chronic kidney disease, stage 3 unspecified: Secondary | ICD-10-CM | POA: Diagnosis not present

## 2023-06-12 DIAGNOSIS — J449 Chronic obstructive pulmonary disease, unspecified: Secondary | ICD-10-CM | POA: Diagnosis not present

## 2023-06-12 DIAGNOSIS — M461 Sacroiliitis, not elsewhere classified: Secondary | ICD-10-CM | POA: Diagnosis not present

## 2023-06-12 DIAGNOSIS — I1310 Hypertensive heart and chronic kidney disease without heart failure, with stage 1 through stage 4 chronic kidney disease, or unspecified chronic kidney disease: Secondary | ICD-10-CM | POA: Diagnosis not present

## 2023-06-12 DIAGNOSIS — I10 Essential (primary) hypertension: Secondary | ICD-10-CM | POA: Diagnosis not present

## 2023-06-12 DIAGNOSIS — M75102 Unspecified rotator cuff tear or rupture of left shoulder, not specified as traumatic: Secondary | ICD-10-CM | POA: Diagnosis not present

## 2023-06-12 DIAGNOSIS — N183 Chronic kidney disease, stage 3 unspecified: Secondary | ICD-10-CM | POA: Diagnosis not present

## 2023-06-12 DIAGNOSIS — E6609 Other obesity due to excess calories: Secondary | ICD-10-CM | POA: Diagnosis not present

## 2023-06-12 DIAGNOSIS — M5416 Radiculopathy, lumbar region: Secondary | ICD-10-CM | POA: Diagnosis not present

## 2023-06-12 DIAGNOSIS — R7309 Other abnormal glucose: Secondary | ICD-10-CM | POA: Diagnosis not present

## 2023-06-12 DIAGNOSIS — Z6831 Body mass index (BMI) 31.0-31.9, adult: Secondary | ICD-10-CM | POA: Diagnosis not present

## 2023-06-27 ENCOUNTER — Ambulatory Visit (HOSPITAL_COMMUNITY)
Admission: RE | Admit: 2023-06-27 | Discharge: 2023-06-27 | Disposition: A | Discharge status: Home / Self Care | Payer: Medicare Other | Source: Ambulatory Visit | Attending: Cardiology | Admitting: Cardiology

## 2023-06-27 DIAGNOSIS — R0602 Shortness of breath: Secondary | ICD-10-CM | POA: Insufficient documentation

## 2023-06-27 LAB — PULMONARY FUNCTION TEST
DL/VA % pred: 67 %
DL/VA: 2.76 ml/min/mmHg/L
DLCO unc % pred: 83 %
DLCO unc: 19 ml/min/mmHg
FEF 25-75 Post: 1.27 L/s
FEF 25-75 Pre: 1.05 L/s
FEF2575-%Change-Post: 21 %
FEF2575-%Pred-Post: 60 %
FEF2575-%Pred-Pre: 49 %
FEV1-%Change-Post: 8 %
FEV1-%Pred-Post: 92 %
FEV1-%Pred-Pre: 84 %
FEV1-Post: 2.55 L
FEV1-Pre: 2.34 L
FEV1FVC-%Change-Post: -4 %
FEV1FVC-%Pred-Pre: 78 %
FEV6-%Change-Post: 7 %
FEV6-%Pred-Post: 117 %
FEV6-%Pred-Pre: 109 %
FEV6-Post: 4.17 L
FEV6-Pre: 3.88 L
FEV6FVC-%Change-Post: -6 %
FEV6FVC-%Pred-Post: 95 %
FEV6FVC-%Pred-Pre: 102 %
FVC-%Change-Post: 13 %
FVC-%Pred-Post: 123 %
FVC-%Pred-Pre: 108 %
FVC-Post: 4.65 L
FVC-Pre: 4.08 L
Post FEV1/FVC ratio: 55 %
Post FEV6/FVC ratio: 90 %
Pre FEV1/FVC ratio: 57 %
Pre FEV6/FVC Ratio: 96 %
RV % pred: 130 %
RV: 2.9 L
TLC % pred: 125 %
TLC: 7.83 L

## 2023-06-27 MED ORDER — ALBUTEROL SULFATE (2.5 MG/3ML) 0.083% IN NEBU
2.5000 mg | INHALATION_SOLUTION | Freq: Once | RESPIRATORY_TRACT | Status: AC
  Administered 2023-06-27: 2.5 mg via RESPIRATORY_TRACT

## 2023-07-11 ENCOUNTER — Ambulatory Visit: Payer: Medicare Other | Admitting: Internal Medicine

## 2023-07-18 ENCOUNTER — Telehealth: Payer: Self-pay | Admitting: *Deleted

## 2023-07-18 NOTE — Telephone Encounter (Signed)
-----   Message from Dina Rich sent at 07/18/2023  8:29 AM EST ----- Breathing tests with very minimal changes, overall look good  Dominga Ferry MD

## 2023-07-18 NOTE — Telephone Encounter (Signed)
 Notified, copy to pcp.

## 2023-07-25 ENCOUNTER — Ambulatory Visit: Payer: Medicare Other | Admitting: Internal Medicine

## 2023-08-01 ENCOUNTER — Telehealth: Payer: Self-pay | Admitting: *Deleted

## 2023-08-01 ENCOUNTER — Ambulatory Visit: Payer: Medicare Other | Admitting: Internal Medicine

## 2023-08-01 ENCOUNTER — Encounter: Payer: Self-pay | Admitting: Internal Medicine

## 2023-08-01 ENCOUNTER — Encounter: Payer: Self-pay | Admitting: *Deleted

## 2023-08-01 VITALS — BP 135/77 | HR 57 | Temp 98.0°F | Ht 66.0 in | Wt 199.8 lb

## 2023-08-01 DIAGNOSIS — R1319 Other dysphagia: Secondary | ICD-10-CM

## 2023-08-01 DIAGNOSIS — K219 Gastro-esophageal reflux disease without esophagitis: Secondary | ICD-10-CM | POA: Diagnosis not present

## 2023-08-01 NOTE — Patient Instructions (Signed)
 Good to see you today!  As discussed, we will go ahead and proceed with an diagnostic EGD with possible esophageal dilation.  ASA 3  You are not due for colonoscopy until 2028  Further recommendations to follow.

## 2023-08-01 NOTE — Telephone Encounter (Signed)
 Per [employer] "The following solutions for the service date entered do not require Pre-Authorization by [employer]. Please note that benefit limits, if applicable, will still be applied. Contact the health plan using the number on the back of the member's ID card if you have any questions regarding coverage or Pre-Authorization requirements."

## 2023-08-01 NOTE — Progress Notes (Unsigned)
 Primary Care Physician:  Elfredia Nevins, MD Primary Gastroenterologist:  Dr. Jena Gauss  Pre-Procedure History & Physical: HPI:  [name] is a 73 y.o. male here for further evaluation of vague epigastric pain and worsening reflux symptoms intermittent dysphagia symptoms.  Does have a history of CAD followed by Dr. Wyline Mood was seen recently.  History of dysphagia successfully treated with Elease Hashimoto dilation previously esophagus appeared normal. History colonic adenoma; due for surveillance colonoscopy 2028. Unfortunately, he continues to smoke.  He has been a lifelong smoker.  No alcohol.  Past Medical History:  Diagnosis Date   Allergic rhinitis    Anxiety and depression    Asthma    CAD (coronary artery disease)    Mild to moderate nonobstructive LAD disease 2006   Chronic low back pain    COPD (chronic obstructive pulmonary disease) (HCC)    Dyslipidemia    Essential hypertension    GERD (gastroesophageal reflux disease)    Hearing loss    Hemorrhoids    Hyperplastic colon polyp 12/28/10   IBS (irritable bowel syndrome)    Langerhan's cell histiocytosis (HCC)    Mitral regurgitation    OSA on CPAP    Tubular adenoma 12/28/10    Past Surgical History:  Procedure Laterality Date   CHOLECYSTECTOMY     COLONOSCOPY  05/23/2005   friable anal canal, hyperplastic polyp   COLONOSCOPY  12/28/2010   Dr. Jena Gauss- tubular adenoma, hyperplastic polyp   COLONOSCOPY N/A 01/16/2015   [name]: anal canal and internal hemorrhois. next surveillance colonoscopy 12/2019   COLONOSCOPY WITH PROPOFOL N/A 04/09/2020   [name]-normal, query chronic appendicitis as cause of ongoing RLQ abd pain   EAR PINNA RECONSTRUCTION W/ RIB GRAFT     right   ESOPHAGOGASTRODUODENOSCOPY     multiple dilations, last EGD 2007 showed small hh, adenomatous appearing gastric mucosa in body but biopsies benign. SB bx negative for Celiac.   ESOPHAGOGASTRODUODENOSCOPY  12/28/2010   Dr. Jena Gauss- normal esophagus s/p  dilationpatchy erythema and erosions.   FOOT SURGERY     right   INGUINAL HERNIA REPAIR     left   INGUINAL HERNIA REPAIR Left 07/20/2018   Procedure: LEFT INDIRECT INGUINAL AND FEMORAL HERNIA REPAIR WITH MESH;  Surgeon: Lucretia Roers, MD;  Location: [facility];  Service: General;  Laterality: Left;   LUNG BIOPSY  05/24/2007   right   MALONEY DILATION  12/28/2010   Procedure: Elease Hashimoto DILATION;  Surgeon: Corbin Ade, MD;  Location: [facility];  Service: Endoscopy;  Laterality: N/A;   TONSILLECTOMY     TRANSTHORACIC ECHOCARDIOGRAM  02/21/2011   EF =>55%, normal chamber size & function; mild mitral and aortic insuff, mild-mod tricuspid insuff; mild pulm htn with rsvp of    Prior to Admission medications   Medication Sig Start Date End Date Taking? Authorizing Provider  albuterol (VENTOLIN HFA) 108 (90 Base) MCG/ACT inhaler Inhale 2 puffs into the lungs every 6 (six) hours as needed for wheezing or shortness of breath. 02/20/19  Yes Karie Fetch P, DO  amLODipine (NORVASC) 10 MG tablet Take 10 mg by mouth daily.   Yes [provider]  aspirin 81 MG tablet Take 81 mg by mouth daily.   Yes [provider]  bisoprolol (ZEBETA) 5 MG tablet Take 0.5 tablets (2.5 mg total) by mouth daily. 01/09/23  Yes BranchDorothe Pea, MD  fenofibrate (TRICOR) 145 MG tablet Take 145 mg by mouth at bedtime.   Yes [provider]  fish oil-omega-3 fatty acids 1000 MG capsule Take 1 g by mouth daily.   Yes [provider]  HYDROcodone-acetaminophen (NORCO/VICODIN) 5-325 MG tablet Take 2 tablets by mouth every 6 (six) hours. 09/08/22  Yes [provider]  losartan (COZAAR) 100 MG tablet Take 1 tablet by mouth once daily 03/14/19  Yes Jonelle Sidle, MD  Multiple Vitamin (MULTIVITAMIN) capsule Take 1 capsule by mouth daily.   Yes [provider]  nitroGLYCERIN (NITROSTAT) 0.4 MG SL tablet Place 1 tablet (0.4 mg total) under the tongue every 5 (five)  minutes as needed for chest pain. 03/21/13  Yes Hilty, [name], MD  omeprazole (PRILOSEC) 20 MG capsule Take 20 mg by mouth daily.  05/31/15  Yes [provider]  simvastatin (ZOCOR) 20 MG tablet TAKE 1 TABLET BY MOUTH AT BEDTIME . APPOINTMENT REQUIRED FOR FUTURE REFILLS 07/27/20  Yes Jonelle Sidle, MD  tadalafil (CIALIS) 20 MG tablet Take 1 tablet (20 mg total) by mouth daily as needed for erectile dysfunction. 06/17/21  Yes Stoneking, [name], MD  tiZANidine (ZANAFLEX) 4 MG tablet Take 4 mg by mouth every 6 (six) hours. 07/12/23  Yes [provider]    Allergies as of 08/01/2023 - Review Complete 08/01/2023  Allergen Reaction Noted   Bee venom Swelling 12/28/2010    Family History  Problem Relation Age of Onset   Heart disease Father 86       etoh/breathing problmes   COPD Father    Heart attack Maternal Grandfather    Diabetes Paternal Grandmother    COPD Paternal Grandmother    Heart Problems Paternal Grandfather    GI problems Mother    Hyperlipidemia Mother    COPD Mother    Heart attack Maternal Grandmother        also valvular problems   Heart attack Brother        stenting x3, also cancer   Skin cancer Brother    Hypertension Brother    Hypertension Sister    Colon cancer Neg Hx    Liver disease Neg Hx    Inflammatory bowel disease Neg Hx     Social History   Socioeconomic History   Marital status: Married    Spouse name: Not on file   Number of children: 1   Years of education: Not on file   Highest education level: Not on file  Occupational History   Occupation: disability    Employer: UNEMPLOYED  Tobacco Use   Smoking status: Every Day    Current packs/day: 1.00    Average packs/day: 1 pack/day for 52.0 years (52.0 ttl pk-yrs)    Types: Cigarettes    Start date: 05/23/1978   Smokeless tobacco: Never   Tobacco comments:    down to 0.25ppd  Vaping Use   Vaping status: Former  Substance and Sexual Activity   Alcohol use: No     Alcohol/week: 0.0 standard drinks of alcohol   Drug use: No   Sexual activity: Not on file  Other Topics Concern   Not on file  Social History Narrative   Son, age 76, hit by truck.   Drinks about 2-3 cups of coffee a day. Occasionally drinks tea.    Social Drivers of Corporate investment banker Strain: Not on file  Food Insecurity: Food Insecurity Present (10/21/2020)   Received from [facility], [facility]   Hunger Vital Sign    Worried About Running Out of Food in the Last Year: Never true  Ran Out of Food in the Last Year: Sometimes true  Transportation Needs: Not on file  Physical Activity: Not on file  Stress: Not on file  Social Connections: Unknown (10/04/2021)   Received from [facility], [facility]   Social Network    Social Network: Not on file  Intimate Partner Violence: Unknown (08/26/2021)   Received from [facility], [facility]   HITS    Physically Hurt: Not on file    Insult or Talk Down To: Not on file    Threaten Physical Harm: Not on file    Scream or Curse: Not on file    Review of Systems: See HPI, otherwise negative ROS  Physical Exam: BP 135/77 (BP Location: Right Arm, Patient Position: Sitting, Cuff Size: Normal)   Pulse (!) 57   Temp 98 F (36.7 C) (Oral)   Ht 5\' 6"  (1.676 m)   Wt 199 lb 12.8 oz (90.6 kg)   SpO2 97%   BMI 32.25 kg/m  General:   Alert,  Well-developed, well-nourished, pleasant and cooperative in NAD Neck:  Supple; no masses or thyromegaly. No significant cervical adenopathy. Lungs:  Clear throughout to auscultation.   No wheezes, crackles, or rhonchi. No acute distress. Heart:  Regular rate and rhythm; no murmurs, clicks, rubs,  or gallops. Abdomen: Non-distended, normal bowel sounds.  Soft and nontender without appreciable mass or hepatosplenomegaly.   Impression/Plan: 73 year old gentleman with longstanding GERD fairly well-controlled on omeprazole 20 mg daily.  Intermittent exacerbation of reflux symptoms  vague intermittent esophageal dysphagia.  Vague epigastric pain.  Nice response to esophageal dilation previously.  It does not sound cardiac in etiology.  Recommendations:  As discussed, we will go ahead and proceed with an diagnostic EGD with possible esophageal dilation.  ASA 3.  The risks, benefits, limitations, alternatives and imponderables have been reviewed with the patient. Potential for esophageal dilation, biopsy, etc. have also been reviewed.  Questions have been answered. All parties agreeable.   due for colonoscopy until 2028  Further recommendations to follow   notice: This dictation was prepared with Dragon dictation along with smaller phrase technology. Any transcriptional errors that result from this process are unintentional and may not be corrected upon review.

## 2023-08-02 ENCOUNTER — Encounter: Payer: Self-pay | Admitting: *Deleted

## 2023-08-02 DIAGNOSIS — M461 Sacroiliitis, not elsewhere classified: Secondary | ICD-10-CM | POA: Diagnosis not present

## 2023-08-02 DIAGNOSIS — W57XXXA Bitten or stung by nonvenomous insect and other nonvenomous arthropods, initial encounter: Secondary | ICD-10-CM | POA: Diagnosis not present

## 2023-08-02 DIAGNOSIS — I10 Essential (primary) hypertension: Secondary | ICD-10-CM | POA: Diagnosis not present

## 2023-08-02 DIAGNOSIS — Z6832 Body mass index (BMI) 32.0-32.9, adult: Secondary | ICD-10-CM | POA: Diagnosis not present

## 2023-08-02 DIAGNOSIS — M5416 Radiculopathy, lumbar region: Secondary | ICD-10-CM | POA: Diagnosis not present

## 2023-08-02 DIAGNOSIS — N183 Chronic kidney disease, stage 3 unspecified: Secondary | ICD-10-CM | POA: Diagnosis not present

## 2023-08-02 DIAGNOSIS — J449 Chronic obstructive pulmonary disease, unspecified: Secondary | ICD-10-CM | POA: Diagnosis not present

## 2023-08-02 DIAGNOSIS — E6609 Other obesity due to excess calories: Secondary | ICD-10-CM | POA: Diagnosis not present

## 2023-08-02 DIAGNOSIS — M75102 Unspecified rotator cuff tear or rupture of left shoulder, not specified as traumatic: Secondary | ICD-10-CM | POA: Diagnosis not present

## 2023-08-02 DIAGNOSIS — I1310 Hypertensive heart and chronic kidney disease without heart failure, with stage 1 through stage 4 chronic kidney disease, or unspecified chronic kidney disease: Secondary | ICD-10-CM | POA: Diagnosis not present

## 2023-08-02 NOTE — Telephone Encounter (Signed)
Called pt and he is aware of pre-op appt.

## 2023-09-15 ENCOUNTER — Encounter (HOSPITAL_COMMUNITY): Payer: Self-pay

## 2023-09-15 ENCOUNTER — Encounter (HOSPITAL_COMMUNITY)
Admission: RE | Admit: 2023-09-15 | Discharge: 2023-09-15 | Disposition: A | Discharge status: Home / Self Care | Source: Ambulatory Visit | Attending: Internal Medicine | Admitting: Internal Medicine

## 2023-09-20 ENCOUNTER — Ambulatory Visit (HOSPITAL_COMMUNITY): Admitting: Anesthesiology

## 2023-09-20 ENCOUNTER — Encounter (HOSPITAL_COMMUNITY)
Admission: RE | Disposition: A | Discharge status: Home / Self Care | Payer: Self-pay | Source: Home / Self Care | Attending: Internal Medicine

## 2023-09-20 ENCOUNTER — Other Ambulatory Visit: Payer: Self-pay

## 2023-09-20 ENCOUNTER — Ambulatory Visit (HOSPITAL_COMMUNITY)
Admission: RE | Admit: 2023-09-20 | Discharge: 2023-09-20 | Disposition: A | Discharge status: Home / Self Care | Attending: Internal Medicine | Admitting: Internal Medicine

## 2023-09-20 ENCOUNTER — Encounter (HOSPITAL_COMMUNITY): Payer: Self-pay | Admitting: Internal Medicine

## 2023-09-20 ENCOUNTER — Telehealth: Payer: Self-pay

## 2023-09-20 DIAGNOSIS — R1310 Dysphagia, unspecified: Secondary | ICD-10-CM

## 2023-09-20 DIAGNOSIS — Z8249 Family history of ischemic heart disease and other diseases of the circulatory system: Secondary | ICD-10-CM | POA: Diagnosis not present

## 2023-09-20 DIAGNOSIS — R011 Cardiac murmur, unspecified: Secondary | ICD-10-CM | POA: Insufficient documentation

## 2023-09-20 DIAGNOSIS — I25119 Atherosclerotic heart disease of native coronary artery with unspecified angina pectoris: Secondary | ICD-10-CM | POA: Diagnosis not present

## 2023-09-20 DIAGNOSIS — K219 Gastro-esophageal reflux disease without esophagitis: Secondary | ICD-10-CM | POA: Diagnosis not present

## 2023-09-20 DIAGNOSIS — F17210 Nicotine dependence, cigarettes, uncomplicated: Secondary | ICD-10-CM | POA: Insufficient documentation

## 2023-09-20 DIAGNOSIS — J45909 Unspecified asthma, uncomplicated: Secondary | ICD-10-CM | POA: Diagnosis not present

## 2023-09-20 DIAGNOSIS — G4733 Obstructive sleep apnea (adult) (pediatric): Secondary | ICD-10-CM | POA: Insufficient documentation

## 2023-09-20 DIAGNOSIS — I2510 Atherosclerotic heart disease of native coronary artery without angina pectoris: Secondary | ICD-10-CM | POA: Diagnosis not present

## 2023-09-20 DIAGNOSIS — M1990 Unspecified osteoarthritis, unspecified site: Secondary | ICD-10-CM | POA: Diagnosis not present

## 2023-09-20 DIAGNOSIS — J4489 Other specified chronic obstructive pulmonary disease: Secondary | ICD-10-CM | POA: Insufficient documentation

## 2023-09-20 DIAGNOSIS — I10 Essential (primary) hypertension: Secondary | ICD-10-CM | POA: Diagnosis not present

## 2023-09-20 DIAGNOSIS — F17200 Nicotine dependence, unspecified, uncomplicated: Secondary | ICD-10-CM | POA: Diagnosis not present

## 2023-09-20 DIAGNOSIS — R1314 Dysphagia, pharyngoesophageal phase: Secondary | ICD-10-CM | POA: Diagnosis not present

## 2023-09-20 DIAGNOSIS — J449 Chronic obstructive pulmonary disease, unspecified: Secondary | ICD-10-CM

## 2023-09-20 DIAGNOSIS — Z79899 Other long term (current) drug therapy: Secondary | ICD-10-CM | POA: Diagnosis not present

## 2023-09-20 DIAGNOSIS — G709 Myoneural disorder, unspecified: Secondary | ICD-10-CM | POA: Diagnosis not present

## 2023-09-20 HISTORY — PX: ESOPHAGOGASTRODUODENOSCOPY: SHX5428

## 2023-09-20 HISTORY — PX: ESOPHAGEAL DILATION: SHX303

## 2023-09-20 SURGERY — EGD (ESOPHAGOGASTRODUODENOSCOPY)
Anesthesia: General

## 2023-09-20 MED ORDER — LIDOCAINE 2% (20 MG/ML) 5 ML SYRINGE
INTRAMUSCULAR | Status: DC | PRN
  Administered 2023-09-20: 80 mg via INTRAVENOUS

## 2023-09-20 MED ORDER — LACTATED RINGERS IV SOLN: INTRAVENOUS | Status: DC | PRN

## 2023-09-20 MED ORDER — PROPOFOL 10 MG/ML IV BOLUS
INTRAVENOUS | Status: DC | PRN
  Administered 2023-09-20: 100 mg via INTRAVENOUS
  Administered 2023-09-20: 100 ug/kg/min via INTRAVENOUS

## 2023-09-20 MED ORDER — OMEPRAZOLE 40 MG PO CPDR: 40.0000 mg | DELAYED_RELEASE_CAPSULE | Freq: Every day | ORAL | 3 refills | Status: AC

## 2023-09-20 NOTE — Discharge Instructions (Signed)
 EGD Discharge instructions Please read the instructions outlined below and refer to this sheet in the next few weeks. These discharge instructions provide you with general information on caring for yourself after you leave the hospital. Your doctor may also give you specific instructions. While your treatment has been planned according to the most current medical practices available, unavoidable complications occasionally occur. If you have any problems or questions after discharge, please call your doctor. ACTIVITY You may resume your regular activity but move at a slower pace for the next 24 hours.  Take frequent rest periods for the next 24 hours.  Walking will help expel (get rid of) the air and reduce the bloated feeling in your abdomen.  No driving for 24 hours (because of the anesthesia (medicine) used during the test).  You may shower.  Do not sign any important legal documents or operate any machinery for 24 hours (because of the anesthesia used during the test).  NUTRITION Drink plenty of fluids.  You may resume your normal diet.  Begin with a light meal and progress to your normal diet.  Avoid alcoholic beverages for 24 hours or as instructed by your caregiver.  MEDICATIONS You may resume your normal medications unless your caregiver tells you otherwise.  WHAT YOU CAN EXPECT TODAY You may experience abdominal discomfort such as a feeling of fullness or "gas" pains.  FOLLOW-UP Your doctor will discuss the results of your test with you.  SEEK IMMEDIATE MEDICAL ATTENTION IF ANY OF THE FOLLOWING OCCUR: Excessive nausea (feeling sick to your stomach) and/or vomiting.  Severe abdominal pain and distention (swelling).  Trouble swallowing.  Temperature over 101 F (37.8 C).  Rectal bleeding or vomiting of blood.       Your esophagus appeared normal today.  It was dilated.  I recommend you increase  your omeprazole  to 40 mg daily.  New prescription has been called into your pharmacy  from my office.    I do continue to recommend you stop smoking   office visit with me in 6 months  At patient request, called Fredi January at [phone] findings and recommendations

## 2023-09-20 NOTE — Op Note (Signed)
 [facility] Patient Name: [name] Procedure Date: 09/20/2023 11:02 AM MRN: [id number] Date of Birth: 02/24/1951 Attending MD: Gemma Kelp , MD, [id number] CSN: [id number] Age: 73 Admit Type: Outpatient Procedure:                Upper GI endoscopy Indications:              Dysphagia Providers:                Gemma Kelp, MD, Vonna Guardian, [place] Wilson,                            Technician, Theola Fitch Referring MD:              Medicines:                Propofol  per Anesthesia Complications:            No immediate complications. Estimated Blood Loss:     Estimated blood loss: none. Procedure:                Pre-Anesthesia Assessment:                           - Prior to the procedure, a History and Physical                            was performed, and patient medications and                            allergies were reviewed. The patient's tolerance of                            previous anesthesia was also reviewed. The risks                            and benefits of the procedure and the sedation                            options and risks were discussed with the patient.                            All questions were answered, and informed consent                            was obtained. Prior Anticoagulants: The patient has                            taken no anticoagulant or antiplatelet agents. ASA                            Grade Assessment: III - A patient with severe                            systemic disease. After reviewing the risks and  benefits, the patient was deemed in satisfactory                            condition to undergo the procedure.                           After obtaining informed consent, the endoscope was                            passed under direct vision. Throughout the                            procedure, the patient's blood pressure, pulse, and                            oxygen  saturations were monitored continuously. The                            [id number]-[id number] ([id number]) scope was introduced through the                            mouth, and advanced to the second part of duodenum.                            The upper GI endoscopy was accomplished without                            difficulty. The patient tolerated the procedure                            well. Scope In: 11:32:58 AM Scope Out: 11:37:54 AM Total Procedure Duration: 0 hours 4 minutes 56 seconds  Findings:      The examined esophagus was normal.      The entire examined stomach was normal.      The duodenal bulb and second portion of the duodenum were normal. The       scope was withdrawn. Dilation was performed with a Maloney dilator with       mild resistance at 56 Fr. The dilation site was examined following       endoscope reinsertion and showed no change. Estimated blood loss: none. Impression:               - Normal esophagus. Dilated.                           - Normal stomach.                           - Normal duodenal bulb and second portion of the                            duodenum.                           - No specimens collected. Moderate Sedation:      Moderate (conscious) sedation was personally administered by  an       anesthesia professional. The following parameters were monitored: oxygen       saturation, heart rate, blood pressure, respiratory rate, EKG, adequacy       of pulmonary ventilation, and response to care. Recommendation:           - Patient has a contact number available for                            emergencies. The signs and symptoms of potential                            delayed complications were discussed with the                            patient. Return to normal activities tomorrow.                            Written discharge instructions were provided to the                            patient.                           - Advance diet as tolerated.                            - Continue present medications. We will increase                            omeprazole  to 40 mg daily. New prescription sent to                            his pharmacy.                           - Return to my office in 6 months. Procedure Code(s):        --- Professional ---                           [phone], Esophagogastroduodenoscopy, flexible,                            transoral; diagnostic, including collection of                            specimen(s) by brushing or washing, when performed                            (separate procedure)                           43450, Dilation of esophagus, by unguided sound or                            bougie, single or multiple passes Diagnosis Code(s):        ---  Professional ---                           R13.10, Dysphagia, unspecified CPT copyright 2022 American Medical Association. All rights reserved. The codes documented in this report are preliminary and upon coder review may  be revised to meet current compliance requirements. Windsor Hatcher. Louann Hopson, MD Gemma Kelp, MD 09/20/2023 11:44:31 AM This report has been signed electronically. Number of Addenda: 0

## 2023-09-20 NOTE — Telephone Encounter (Signed)
-----   Message from Garnette Ka sent at 09/20/2023 11:44 AM EDT -----  needs new prescriptions.  Omeprazole  40 mg pill take 1 in the morning 30 minutes before breakfast.  Dispense 90 with 3 refills.  He is discontinuing omeprazole  20 mg daily

## 2023-09-20 NOTE — Anesthesia Postprocedure Evaluation (Signed)
 Anesthesia Post Note  Patient: [name]  Procedure(s) Performed: EGD (ESOPHAGOGASTRODUODENOSCOPY) DILATION, ESOPHAGUS  Patient location during evaluation: Phase II Anesthesia Type: General Level of consciousness: awake and alert Pain management: pain level controlled Vital Signs Assessment: post-procedure vital signs reviewed and stable Respiratory status: spontaneous breathing, nonlabored ventilation, respiratory function stable and patient connected to nasal cannula oxygen Cardiovascular status: blood pressure returned to baseline and stable Postop Assessment: no apparent nausea or vomiting Anesthetic complications: no   There were no known notable events for this encounter.   Last Vitals:  Vitals:   09/20/23 1143 09/20/23 1147  BP:  105/82  Pulse: 63   Resp:    Temp: (!) 36.4 C   SpO2: 99%     Last Pain:  Vitals:   09/20/23 1143  TempSrc: Oral  PainSc: 0-No pain                 [name] Shelia Kingsberry

## 2023-09-20 NOTE — Anesthesia Preprocedure Evaluation (Addendum)
 Anesthesia Evaluation  Patient identified by MRN, date of birth, ID band Patient awake    Reviewed: Allergy & Precautions, NPO status , Patient's Chart, lab work & pertinent test results  History of Anesthesia Complications Negative for: history of anesthetic complications  Airway Mallampati: II  TM Distance: >3 FB Neck ROM: Full    Dental  (+) Edentulous Lower, Upper Dentures   Pulmonary asthma , sleep apnea and Continuous Positive Airway Pressure Ventilation , COPD,  COPD inhaler, Current Smoker and Patient abstained from smoking.   Pulmonary exam normal breath sounds clear to auscultation       Cardiovascular METS: 3 - Mets hypertension, Pt. on medications + angina (sometimes gets angina with exertion) with exertion + CAD (nonobstructive)  Normal cardiovascular exam+ Valvular Problems/Murmurs MR  Rhythm:Regular Rate:Normal     Neuro/Psych  PSYCHIATRIC DISORDERS Anxiety Depression     Neuromuscular disease    GI/Hepatic Neg liver ROS,GERD  Medicated and Controlled,,  Endo/Other  negative endocrine ROS    Renal/GU negative Renal ROS     Musculoskeletal  (+) Arthritis  (back pain), Osteoarthritis,    Abdominal   Peds  Hematology negative hematology ROS (+)   Anesthesia Other Findings   Reproductive/Obstetrics negative OB ROS                             Anesthesia Physical Anesthesia Plan  ASA: 3  Anesthesia Plan: General   Post-op Pain Management: Minimal or no pain anticipated   Induction: Intravenous  PONV Risk Score and Plan: Propofol  infusion  Airway Management Planned: Nasal Cannula and Natural Airway  Additional Equipment: None  Intra-op Plan:   Post-operative Plan:   Informed Consent: I have reviewed the patients History and Physical, chart, labs and discussed the procedure including the risks, benefits and alternatives for the proposed anesthesia with the patient or  authorized representative who has indicated his/her understanding and acceptance.     Dental advisory given  Plan Discussed with: CRNA  Anesthesia Plan Comments:         Anesthesia Quick Evaluation

## 2023-09-20 NOTE — H&P (Signed)
 @LOGO @   Primary Care Physician:  Kathyleen Parkins, MD Primary Gastroenterologist:  Dr. Riley Cheadle  Pre-Procedure History & Physical: HPI:  [name] is a 73 y.o. male here for  here for further evaluation of breakthrough reflux symptoms and intermittent esophageal dysphagia.  Takes omeprazole  20 mg daily.  Continues to smoke.  Past Medical History:  Diagnosis Date   Allergic rhinitis    Anxiety and depression    Asthma    CAD (coronary artery disease)    Mild to moderate nonobstructive LAD disease 2006   Chronic low back pain    COPD (chronic obstructive pulmonary disease) (HCC)    Dyslipidemia    Essential hypertension    GERD (gastroesophageal reflux disease)    Hearing loss    Hemorrhoids    Hyperplastic colon polyp 12/28/10   IBS (irritable bowel syndrome)    Langerhan's cell histiocytosis (HCC)    Mitral regurgitation    OSA on CPAP    Tubular adenoma 12/28/10    Past Surgical History:  Procedure Laterality Date   CHOLECYSTECTOMY     COLONOSCOPY  05/23/2005   friable anal canal, hyperplastic polyp   COLONOSCOPY  12/28/2010   Dr. Riley Cheadle- tubular adenoma, hyperplastic polyp   COLONOSCOPY N/A 01/16/2015   Gila Lauf: anal canal and internal hemorrhois. next surveillance colonoscopy 12/2019   COLONOSCOPY WITH PROPOFOL  N/A 04/09/2020   Jayah Balthazar-normal, query chronic appendicitis as cause of ongoing RLQ abd pain   EAR PINNA RECONSTRUCTION W/ RIB GRAFT     right   ESOPHAGOGASTRODUODENOSCOPY     multiple dilations, last EGD 2007 showed small hh, adenomatous appearing gastric mucosa in body but biopsies benign. SB bx negative for Celiac.   ESOPHAGOGASTRODUODENOSCOPY  12/28/2010   Dr. Riley Cheadle- normal esophagus s/p dilationpatchy erythema and erosions.   FOOT SURGERY     right   INGUINAL HERNIA REPAIR     left   INGUINAL HERNIA REPAIR Left 07/20/2018   Procedure: LEFT INDIRECT INGUINAL AND FEMORAL HERNIA REPAIR WITH MESH;  Surgeon: Awilda Bogus, MD;  Location: [facility];  Service:  General;  Laterality: Left;   LUNG BIOPSY  05/24/2007   right   MALONEY DILATION  12/28/2010   Procedure: Londa Rival DILATION;  Surgeon: Suzette Espy, MD;  Location: [facility];  Service: Endoscopy;  Laterality: N/A;   TONSILLECTOMY     TRANSTHORACIC ECHOCARDIOGRAM  02/21/2011   EF =>55%, normal chamber size & function; mild mitral and aortic insuff, mild-mod tricuspid insuff; mild pulm htn with rsvp of    Prior to Admission medications   Medication Sig Start Date End Date Taking? Authorizing Provider  albuterol  (VENTOLIN  HFA) 108 (90 Base) MCG/ACT inhaler Inhale 2 puffs into the lungs every 6 (six) hours as needed for wheezing or shortness of breath. 02/20/19  Yes Jaquita Merl P, DO  amLODipine (NORVASC) 10 MG tablet Take 10 mg by mouth daily.   Yes [provider]  aspirin 81 MG tablet Take 81 mg by mouth daily.   Yes [provider]  bisoprolol  (ZEBETA ) 5 MG tablet Take 0.5 tablets (2.5 mg total) by mouth daily. 01/09/23  Yes BranchJoyceann No, MD  fenofibrate (TRICOR) 145 MG tablet Take 145 mg by mouth at bedtime.   Yes [provider]  fish oil-omega-3 fatty acids 1000 MG capsule Take 1 g by mouth daily.   Yes [provider]  HYDROcodone -acetaminophen  (NORCO/VICODIN) 5-325 MG tablet Take 2 tablets by mouth every 6 (six) hours. 09/08/22  Yes [provider]  losartan  (COZAAR ) 100 MG tablet Take 1 tablet by mouth once daily 03/14/19  Yes Gerard Knight, MD  Multiple Vitamin (MULTIVITAMIN) capsule Take 1 capsule by mouth daily.   Yes [provider]  omeprazole  (PRILOSEC) 20 MG capsule Take 20 mg by mouth daily.  05/31/15  Yes [provider]  simvastatin  (ZOCOR ) 20 MG tablet TAKE 1 TABLET BY MOUTH AT BEDTIME . APPOINTMENT REQUIRED FOR FUTURE REFILLS 07/27/20  Yes Gerard Knight, MD  tadalafil  (CIALIS ) 20 MG tablet Take 1 tablet (20 mg total) by mouth daily as needed for erectile dysfunction. 06/17/21  Yes Stoneking,  Ponce Brisker., MD  tiZANidine (ZANAFLEX) 4 MG tablet Take 4 mg by mouth every 6 (six) hours. 07/12/23  Yes [provider]  nitroGLYCERIN  (NITROSTAT ) 0.4 MG SL tablet Place 1 tablet (0.4 mg total) under the tongue every 5 (five) minutes as needed for chest pain. 03/21/13   Hazle Lites, MD    Allergies as of 08/01/2023 - Review Complete 08/01/2023  Allergen Reaction Noted   Bee venom Swelling 12/28/2010    Family History  Problem Relation Age of Onset   Heart disease Father 101       etoh/breathing problmes   COPD Father    Heart attack Maternal Grandfather    Diabetes Paternal Grandmother    COPD Paternal Grandmother    Heart Problems Paternal Grandfather    GI problems Mother    Hyperlipidemia Mother    COPD Mother    Heart attack Maternal Grandmother        also valvular problems   Heart attack Brother        stenting x3, also cancer   Skin cancer Brother    Hypertension Brother    Hypertension Sister    Colon cancer Neg Hx    Liver disease Neg Hx    Inflammatory bowel disease Neg Hx     Social History   Socioeconomic History   Marital status: Married    Spouse name: Not on file   Number of children: 1   Years of education: Not on file   Highest education level: Not on file  Occupational History   Occupation: disability    Employer: UNEMPLOYED  Tobacco Use   Smoking status: Every Day    Current packs/day: 1.00    Average packs/day: 1 pack/day for 52.1 years (52.1 ttl pk-yrs)    Types: Cigarettes    Start date: 05/23/1978   Smokeless tobacco: Never   Tobacco comments:    down to 0.25ppd  Vaping Use   Vaping status: Former  Substance and Sexual Activity   Alcohol use: No    Alcohol/week: 0.0 standard drinks of alcohol   Drug use: No   Sexual activity: Not on file  Other Topics Concern   Not on file  Social History Narrative   Son, age 37, hit by truck.   Drinks about 2-3 cups of coffee a day. Occasionally drinks tea.    Social Drivers of  Corporate investment banker Strain: Not on file  Food Insecurity: Food Insecurity Present (10/21/2020)   Received from [facility]   Hunger Vital Sign    Worried About Running Out of Food in the Last Year: Never true    Ran Out of Food in the Last Year: Sometimes true  Transportation Needs: Not on file  Physical Activity: Not on file  Stress: Not on file  Social Connections: Unknown (10/04/2021)   [facility]   Social Network    Social Network: Not on file  Intimate Partner Violence: Unknown (08/26/2021)   [facility]   HITS    Physically Hurt: Not on file    Insult or Talk Down To: Not on file    Threaten Physical Harm: Not on file    Scream or Curse: Not on file    Review of Systems: See HPI, otherwise negative ROS  Physical Exam: BP (!) 140/82   Pulse (!) 57   Temp 98.4 F (36.9 C) (Oral)   Resp 14   Ht 5\' 6"  (1.676 m)   Wt 90.7 kg   SpO2 97%   BMI 32.28 kg/m  General:   Alert,  Well-developed, well-nourished, pleasant and cooperative in NAD Neck:  Supple; no masses or thyromegaly. No significant cervical adenopathy. Lungs:  Clear throughout to auscultation.   No wheezes, crackles, or rhonchi. No acute distress. Heart:  Regular rate and rhythm; no murmurs, clicks, rubs,  or gallops. Abdomen: Non-distended, normal bowel sounds.  Soft and nontender without appreciable mass or hepatosplenomegaly.   Impression/Plan:    73 year old long-term smoker with breakthrough reflux symptoms on omeprazole  20 mg daily.  Also, recurrent esophageal dysphagia.   I have offered the patient an EGD with esophageal dilation is feasible/appropriate per plan.  The risks, benefits, limitations, alternatives and imponderables have been reviewed with the patient. Potential for esophageal dilation, biopsy, etc. have also been reviewed.  Questions have been answered. All parties agreeable.      Notice: This dictation was  prepared with Dragon dictation along with smaller phrase technology. Any transcriptional errors that result from this process are unintentional and may not be corrected upon review.

## 2023-09-20 NOTE — Telephone Encounter (Signed)
Rx was sent to pharmacy on file.  

## 2023-09-20 NOTE — Transfer of Care (Signed)
 Immediate Anesthesia Transfer of Care Note  Patient: [name]  Procedure(s) Performed: EGD (ESOPHAGOGASTRODUODENOSCOPY) DILATION, ESOPHAGUS  Patient Location: PACU  Anesthesia Type:General  Level of Consciousness: awake, alert , oriented, and patient cooperative  Airway & Oxygen Therapy: Patient Spontanous Breathing  Post-op Assessment: Report given to RN, Post -op Vital signs reviewed and stable, and Patient moving all extremities X 4  Post vital signs: Reviewed and stable  Last Vitals:  Vitals Value Taken Time  BP 92/60   Temp 97.5   Pulse 60   Resp 12   SpO2 97     Last Pain:  Vitals:   09/20/23 1128  TempSrc:   PainSc: 0-No pain         Complications: No notable events documented.

## 2023-09-21 ENCOUNTER — Encounter (HOSPITAL_COMMUNITY): Payer: Self-pay | Admitting: Internal Medicine

## 2023-09-27 DIAGNOSIS — J449 Chronic obstructive pulmonary disease, unspecified: Secondary | ICD-10-CM | POA: Diagnosis not present

## 2023-09-27 DIAGNOSIS — M75102 Unspecified rotator cuff tear or rupture of left shoulder, not specified as traumatic: Secondary | ICD-10-CM | POA: Diagnosis not present

## 2023-09-27 DIAGNOSIS — E6609 Other obesity due to excess calories: Secondary | ICD-10-CM | POA: Diagnosis not present

## 2023-09-27 DIAGNOSIS — N183 Chronic kidney disease, stage 3 unspecified: Secondary | ICD-10-CM | POA: Diagnosis not present

## 2023-09-27 DIAGNOSIS — G894 Chronic pain syndrome: Secondary | ICD-10-CM | POA: Diagnosis not present

## 2023-09-27 DIAGNOSIS — M461 Sacroiliitis, not elsewhere classified: Secondary | ICD-10-CM | POA: Diagnosis not present

## 2023-09-27 DIAGNOSIS — I1310 Hypertensive heart and chronic kidney disease without heart failure, with stage 1 through stage 4 chronic kidney disease, or unspecified chronic kidney disease: Secondary | ICD-10-CM | POA: Diagnosis not present

## 2023-09-27 DIAGNOSIS — I10 Essential (primary) hypertension: Secondary | ICD-10-CM | POA: Diagnosis not present

## 2023-09-27 DIAGNOSIS — Z6831 Body mass index (BMI) 31.0-31.9, adult: Secondary | ICD-10-CM | POA: Diagnosis not present

## 2023-09-27 DIAGNOSIS — M5416 Radiculopathy, lumbar region: Secondary | ICD-10-CM | POA: Diagnosis not present

## 2023-10-10 ENCOUNTER — Ambulatory Visit: Payer: Medicare Other | Admitting: Cardiology

## 2023-10-17 ENCOUNTER — Encounter: Payer: Self-pay | Admitting: Cardiology

## 2023-10-17 ENCOUNTER — Ambulatory Visit: Payer: Medicare Other | Attending: Cardiology | Admitting: Cardiology

## 2023-10-17 VITALS — BP 118/72 | HR 71 | Ht 66.0 in | Wt 197.4 lb

## 2023-10-17 DIAGNOSIS — I2510 Atherosclerotic heart disease of native coronary artery without angina pectoris: Secondary | ICD-10-CM | POA: Diagnosis not present

## 2023-10-17 DIAGNOSIS — R002 Palpitations: Secondary | ICD-10-CM

## 2023-10-17 DIAGNOSIS — E782 Mixed hyperlipidemia: Secondary | ICD-10-CM

## 2023-10-17 DIAGNOSIS — R0602 Shortness of breath: Secondary | ICD-10-CM | POA: Diagnosis not present

## 2023-10-17 DIAGNOSIS — I10 Essential (primary) hypertension: Secondary | ICD-10-CM | POA: Diagnosis not present

## 2023-10-17 NOTE — Progress Notes (Signed)
 Clinical Summary [name] is a 73 y.o.male seen today for follow up of the following medical problems.    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  -10/2022 echo: LVEF 55-60%, no WMAs, grade I dd 11/2022 nuclear stress: Patient unable to achieve target heart rate with Bruce protocol. He experienced dyspnea and EKG showed ventricular bigeminy. Switched to Lexiscan  No ischemia by perfusion imaging.    - 2 episodes of chest pain about 1.5 months ago  - overall infrequent. Tends to occur at rest. Similar to his chronic symptoms    2. COPD - followed by pulmonary - 2015 PFTs mild airflow limitation, improves with bronchodilators  -06/2023 PFTs: minimal obstruction - chronic SOB, he reports typically sedentary. Has treadmill at home but has not been suing.        3. Hyperlipidemia - 02/2022 TC 127 TG 143 HDL 44 LDL 58 - he is on simva 20 and fenofibrate - 02/2023 TC 126 TG 122 HDL 40 LDL 64 - compliant with meds     4. HTN - compliant with meds - home bp's 110s/70s         5. 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 Reports being sedentary, has COPD as well.    10/2022 echo: LVEF 55-60%, grade I dd, trivial MR, no AI   6. OSA - he reports was told borderline on most recent check, not requiring cpap   7.PVCs - 12/2022 monitor: rare PACs, 11 runs SVT longest 11 beats. Rare ventricular ectopy - drinks 2-3 pots of coffee per day. High stress at home taking care of his mom   Can have some palpitations. Variable, overall infrequent and short and duration.  - still with high caffeine intake, has not weaned.    AAA screen male over 47 with tobacco history 02/2020 CT A/P no aneurysm noted   His son Trevaun Rendleman is also a patient of mine. Past Medical History:  Diagnosis Date   Allergic rhinitis    Anxiety and  depression    Asthma    CAD (coronary artery disease)    Mild to moderate nonobstructive LAD disease 2006   Chronic low back pain    COPD (chronic obstructive pulmonary disease) (HCC)    Dyslipidemia    Essential hypertension    GERD (gastroesophageal reflux disease)    Hearing loss    Hemorrhoids    Hyperplastic colon polyp 12/28/10   IBS (irritable bowel syndrome)    Langerhan's cell histiocytosis (HCC)    Mitral regurgitation    OSA on CPAP    Tubular adenoma 12/28/10     Allergies  Allergen Reactions   Bee Venom Swelling     Current Outpatient Medications  Medication Sig Dispense Refill   albuterol  (VENTOLIN  HFA) 108 (90 Base) MCG/ACT inhaler Inhale 2 puffs into the lungs every 6 (six) hours as needed for wheezing or shortness of breath. 6.7 g 6   amLODipine (NORVASC) 10 MG tablet Take 10 mg by mouth daily.     aspirin 81 MG tablet Take 81 mg by mouth daily.     bisoprolol  (ZEBETA ) 5 MG tablet Take 0.5 tablets (2.5 mg total) by mouth daily. 135 tablet 1   fenofibrate (TRICOR) 145 MG tablet Take 145 mg by mouth at bedtime.     fish  oil-omega-3 fatty acids 1000 MG capsule Take 1 g by mouth daily.     HYDROcodone -acetaminophen  (NORCO/VICODIN) 5-325 MG tablet Take 2 tablets by mouth every 6 (six) hours.     losartan  (COZAAR ) 100 MG tablet Take 1 tablet by mouth once daily 90 tablet 1   Multiple Vitamin (MULTIVITAMIN) capsule Take 1 capsule by mouth daily.     nitroGLYCERIN  (NITROSTAT ) 0.4 MG SL tablet Place 1 tablet (0.4 mg total) under the tongue every 5 (five) minutes as needed for chest pain. 25 tablet 3   omeprazole  (PRILOSEC) 40 MG capsule Take 1 capsule (40 mg total) by mouth daily. 90 capsule 3   simvastatin  (ZOCOR ) 20 MG tablet TAKE 1 TABLET BY MOUTH AT BEDTIME . APPOINTMENT REQUIRED FOR FUTURE REFILLS 15 tablet 0   tadalafil  (CIALIS ) 20 MG tablet Take 1 tablet (20 mg total) by mouth daily as needed for erectile dysfunction. 6 tablet 11   tiZANidine (ZANAFLEX) 4 MG tablet  Take 4 mg by mouth every 6 (six) hours.     No current facility-administered medications for this visit.     Past Surgical History:  Procedure Laterality Date   CHOLECYSTECTOMY     COLONOSCOPY  05/23/2005   friable anal canal, hyperplastic polyp   COLONOSCOPY  12/28/2010   Dr. Riley Cheadle- tubular adenoma, hyperplastic polyp   COLONOSCOPY N/A 01/16/2015   Rourk: anal canal and internal hemorrhois. next surveillance colonoscopy 12/2019   COLONOSCOPY WITH PROPOFOL  N/A 04/09/2020   Rourk-normal, query chronic appendicitis as cause of ongoing RLQ abd pain   EAR PINNA RECONSTRUCTION W/ RIB GRAFT     right   ESOPHAGEAL DILATION N/A 09/20/2023   Procedure: DILATION, ESOPHAGUS;  Surgeon: Suzette Espy, MD;  Location: [facility];  Service: Endoscopy;  Laterality: N/A;   ESOPHAGOGASTRODUODENOSCOPY     multiple dilations, last EGD 2007 showed small hh, adenomatous appearing gastric mucosa in body but biopsies benign. SB bx negative for Celiac.   ESOPHAGOGASTRODUODENOSCOPY  12/28/2010   Dr. Riley Cheadle- normal esophagus s/p dilationpatchy erythema and erosions.   ESOPHAGOGASTRODUODENOSCOPY N/A 09/20/2023   Procedure: EGD (ESOPHAGOGASTRODUODENOSCOPY);  Surgeon: Suzette Espy, MD;  Location: [facility];  Service: Endoscopy;  Laterality: N/A;  11:30AM, ASA 3   FOOT SURGERY     right   INGUINAL HERNIA REPAIR     left   INGUINAL HERNIA REPAIR Left 07/20/2018   Procedure: LEFT INDIRECT INGUINAL AND FEMORAL HERNIA REPAIR WITH MESH;  Surgeon: Awilda Bogus, MD;  Location: [facility];  Service: General;  Laterality: Left;   LUNG BIOPSY  05/24/2007   right   MALONEY DILATION  12/28/2010   Procedure: Londa Rival DILATION;  Surgeon: Suzette Espy, MD;  Location: [facility];  Service: Endoscopy;  Laterality: N/A;   TONSILLECTOMY     TRANSTHORACIC ECHOCARDIOGRAM  02/21/2011   EF =>55%, normal chamber size & function; mild mitral and aortic insuff, mild-mod tricuspid insuff; mild pulm htn with rsvp of      Allergies  Allergen Reactions   Bee Venom Swelling      Family History  Problem Relation Age of Onset   Heart disease Father 42       etoh/breathing problmes   COPD Father    Heart attack Maternal Grandfather    Diabetes Paternal Grandmother    COPD Paternal Grandmother    Heart Problems Paternal Grandfather    GI problems Mother    Hyperlipidemia Mother    COPD Mother    Heart attack Maternal  Grandmother        also valvular problems   Heart attack Brother        stenting x3, also cancer   Skin cancer Brother    Hypertension Brother    Hypertension Sister    Colon cancer Neg Hx    Liver disease Neg Hx    Inflammatory bowel disease Neg Hx      Social History [name] reports that he has been smoking cigarettes. He started smoking about 45 years ago. He has a 52.2 pack-year smoking history. He has never used smokeless tobacco. [name] reports no history of alcohol use.     Physical Examination Today's Vitals   10/17/23 0949  BP: 118/72  Pulse: 71  SpO2: 96%  Weight: 197 lb 6.4 oz (89.5 kg)  Height: 5\' 6"  (1.676 m)   Body mass index is 31.86 kg/m.  Gen: resting comfortably, no acute distress HEENT: no scleral icterus, pupils equal round and reactive, no palptable cervical adenopathy,  CV: RRR, no m/rg, no jvd Resp: Clear to auscultation bilaterally GI: abdomen is soft, non-tender, non-distended, normal bowel sounds, no hepatosplenomegaly MSK: extremities are warm, no edema.  Skin: warm, no rash Neuro:  no focal deficits Psych: appropriate affect   Diagnostic Studies Lexiscan  Myoview  04/28/2015: The left ventricular ejection fraction is normal (55-65%). Nuclear stress EF: 63%. There was no ST segment deviation noted during stress. The study is normal.   Normal stress nuclear study with a small, mild, fixed inferior septal defect consistent with thinning; no ischemia; EF 63 with normal wall motion.   Chest CT  12/04/2018: FINDINGS: Cardiovascular: The heart is normal in size. No pericardial effusion.   No evidence thoracic aortic aneurysm. Mild atherosclerotic calcification of the aortic arch.   Mild three-vessel coronary atherosclerosis.   Mediastinum/Nodes: No suspicious mediastinal lymphadenopathy.   Visualized thyroid  is unremarkable.   Lungs/Pleura: Mild centrilobular emphysematous changes, upper lung predominant.   Mild subpleural reticulation/fibrosis in the lungs bilaterally, suggesting superimposed chronic interstitial lung disease.   No focal consolidation.   Numerous scattered bilateral pulmonary nodules, measuring up to 6.2 mm, most of which are unchanged. A few bilateral pulmonary nodules are no longer visualized. A few new pulmonary nodules measuring less than 4 mm were not conspicuous on the prior. Overall, these findings are can be considered benign.   No pleural effusion or pneumothorax.   Upper Abdomen: Visualized upper abdomen is grossly unremarkable, noting prior cholecystectomy and a benign left adrenal adenoma.   Musculoskeletal: Degenerative changes of the visualized thoracolumbar spine.   IMPRESSION: Lung-RADS 2, benign appearance or behavior. Continue annual screening with low-dose chest CT without contrast in 12 months.   Aortic Atherosclerosis (ICD10-I70.0) and Emphysema (ICD10-J43.9).   10/2022 echo 1. Left ventricular ejection fraction, by estimation, is 55 to 60%. The  left ventricle has normal function. The left ventricle has no regional  wall motion abnormalities. Left ventricular diastolic parameters are  consistent with Grade I diastolic  dysfunction (impaired relaxation).   2. Right ventricular systolic function is normal. The right ventricular  size is mildly enlarged. There is normal pulmonary artery systolic  pressure.   3. Right atrial size was mildly dilated.   4. The mitral valve is grossly normal. Trivial mitral valve   regurgitation. No evidence of mitral stenosis.   5. The aortic valve was not well visualized. Aortic valve regurgitation  is not visualized. No aortic stenosis is present.   6. The inferior vena cava is normal in size with  greater than 50%  respiratory variability, suggesting right atrial pressure of 3 mmHg.      11/2022 nuclear stress Patient unable to achieve target heart rate with Bruce protocol. He experienced dyspnea and EKG showed ventricular bigeminy. Switched to Lexiscan .   Stress EKG showed frequent PVCs and no ischemia.   LV perfusion is normal. There is no evidence of ischemia. There is no evidence of infarction.   Left ventricular function is normal. Nuclear stress EF: 65%.   The study is normal. The study is low risk.  06/2023 PFTs Minimal obstructive   Assessment and Plan  1.CAD/Chest pain - mild CAD by prior cath in 2006 - recent nuclear stress test was benign - chronic noncardiac chest pains, overall infrequent. Continue to monitor   2. SOB - recent benign echo and nuclear stress. PFTs minimal obstruction - likely deconditioning is main issue, encouraged to increase daily exercise.    3. HTN - he is at goal, continue current meds   4.Hyperlipidemia - LDL at goal, continue current meds   5. Palpitations - monitor with rare benign ectopy - very high caffeine intake, 2-3 pots of coffee daily. Have discussed again weaning.  - he will continue bisoprolol , symptoms mild and tolerable at this time  F/u 6 months     Laurann Pollock, M.D.

## 2023-10-17 NOTE — Patient Instructions (Signed)
 Medication Instructions:  Continue all current medications.   Labwork: none  Testing/Procedures: none  Follow-Up: 6 months   Any Other Special Instructions Will Be Listed Below (If Applicable).   If you need a refill on your cardiac medications before your next appointment, please call your pharmacy.

## 2023-11-15 DIAGNOSIS — M5416 Radiculopathy, lumbar region: Secondary | ICD-10-CM | POA: Diagnosis not present

## 2023-11-15 DIAGNOSIS — M461 Sacroiliitis, not elsewhere classified: Secondary | ICD-10-CM | POA: Diagnosis not present

## 2023-11-15 DIAGNOSIS — Z6831 Body mass index (BMI) 31.0-31.9, adult: Secondary | ICD-10-CM | POA: Diagnosis not present

## 2023-11-15 DIAGNOSIS — M75102 Unspecified rotator cuff tear or rupture of left shoulder, not specified as traumatic: Secondary | ICD-10-CM | POA: Diagnosis not present

## 2023-11-15 DIAGNOSIS — I10 Essential (primary) hypertension: Secondary | ICD-10-CM | POA: Diagnosis not present

## 2023-11-15 DIAGNOSIS — G4730 Sleep apnea, unspecified: Secondary | ICD-10-CM | POA: Diagnosis not present

## 2023-11-15 DIAGNOSIS — J449 Chronic obstructive pulmonary disease, unspecified: Secondary | ICD-10-CM | POA: Diagnosis not present

## 2023-11-15 DIAGNOSIS — G894 Chronic pain syndrome: Secondary | ICD-10-CM | POA: Diagnosis not present

## 2023-11-15 DIAGNOSIS — M25562 Pain in left knee: Secondary | ICD-10-CM | POA: Diagnosis not present

## 2023-11-15 DIAGNOSIS — N183 Chronic kidney disease, stage 3 unspecified: Secondary | ICD-10-CM | POA: Diagnosis not present

## 2023-11-15 DIAGNOSIS — I1310 Hypertensive heart and chronic kidney disease without heart failure, with stage 1 through stage 4 chronic kidney disease, or unspecified chronic kidney disease: Secondary | ICD-10-CM | POA: Diagnosis not present

## 2023-11-15 DIAGNOSIS — E6609 Other obesity due to excess calories: Secondary | ICD-10-CM | POA: Diagnosis not present

## 2023-12-05 ENCOUNTER — Other Ambulatory Visit (HOSPITAL_COMMUNITY): Payer: Self-pay | Admitting: Internal Medicine

## 2023-12-05 ENCOUNTER — Ambulatory Visit (HOSPITAL_COMMUNITY)
Admission: RE | Admit: 2023-12-05 | Discharge: 2023-12-05 | Disposition: A | Discharge status: Home / Self Care | Source: Ambulatory Visit | Attending: Internal Medicine | Admitting: Internal Medicine

## 2023-12-05 DIAGNOSIS — Z6831 Body mass index (BMI) 31.0-31.9, adult: Secondary | ICD-10-CM | POA: Diagnosis not present

## 2023-12-05 DIAGNOSIS — M25562 Pain in left knee: Secondary | ICD-10-CM

## 2023-12-05 DIAGNOSIS — E6609 Other obesity due to excess calories: Secondary | ICD-10-CM | POA: Diagnosis not present

## 2023-12-05 DIAGNOSIS — M5416 Radiculopathy, lumbar region: Secondary | ICD-10-CM | POA: Diagnosis not present

## 2023-12-26 DIAGNOSIS — J449 Chronic obstructive pulmonary disease, unspecified: Secondary | ICD-10-CM | POA: Diagnosis not present

## 2023-12-26 DIAGNOSIS — M5416 Radiculopathy, lumbar region: Secondary | ICD-10-CM | POA: Diagnosis not present

## 2023-12-26 DIAGNOSIS — M25562 Pain in left knee: Secondary | ICD-10-CM | POA: Diagnosis not present

## 2023-12-26 DIAGNOSIS — Z6831 Body mass index (BMI) 31.0-31.9, adult: Secondary | ICD-10-CM | POA: Diagnosis not present

## 2023-12-26 DIAGNOSIS — N183 Chronic kidney disease, stage 3 unspecified: Secondary | ICD-10-CM | POA: Diagnosis not present

## 2023-12-26 DIAGNOSIS — I1310 Hypertensive heart and chronic kidney disease without heart failure, with stage 1 through stage 4 chronic kidney disease, or unspecified chronic kidney disease: Secondary | ICD-10-CM | POA: Diagnosis not present

## 2023-12-26 DIAGNOSIS — M461 Sacroiliitis, not elsewhere classified: Secondary | ICD-10-CM | POA: Diagnosis not present

## 2023-12-26 DIAGNOSIS — E6609 Other obesity due to excess calories: Secondary | ICD-10-CM | POA: Diagnosis not present

## 2023-12-26 DIAGNOSIS — I10 Essential (primary) hypertension: Secondary | ICD-10-CM | POA: Diagnosis not present

## 2024-01-11 DIAGNOSIS — M5416 Radiculopathy, lumbar region: Secondary | ICD-10-CM | POA: Diagnosis not present

## 2024-01-11 DIAGNOSIS — I10 Essential (primary) hypertension: Secondary | ICD-10-CM | POA: Diagnosis not present

## 2024-01-11 DIAGNOSIS — G894 Chronic pain syndrome: Secondary | ICD-10-CM | POA: Diagnosis not present

## 2024-01-11 DIAGNOSIS — N183 Chronic kidney disease, stage 3 unspecified: Secondary | ICD-10-CM | POA: Diagnosis not present

## 2024-01-11 DIAGNOSIS — Z6831 Body mass index (BMI) 31.0-31.9, adult: Secondary | ICD-10-CM | POA: Diagnosis not present

## 2024-01-11 DIAGNOSIS — M75102 Unspecified rotator cuff tear or rupture of left shoulder, not specified as traumatic: Secondary | ICD-10-CM | POA: Diagnosis not present

## 2024-01-11 DIAGNOSIS — E6609 Other obesity due to excess calories: Secondary | ICD-10-CM | POA: Diagnosis not present

## 2024-01-11 DIAGNOSIS — J449 Chronic obstructive pulmonary disease, unspecified: Secondary | ICD-10-CM | POA: Diagnosis not present

## 2024-01-11 DIAGNOSIS — M461 Sacroiliitis, not elsewhere classified: Secondary | ICD-10-CM | POA: Diagnosis not present

## 2024-01-11 DIAGNOSIS — I1310 Hypertensive heart and chronic kidney disease without heart failure, with stage 1 through stage 4 chronic kidney disease, or unspecified chronic kidney disease: Secondary | ICD-10-CM | POA: Diagnosis not present

## 2024-02-08 DIAGNOSIS — M545 Low back pain, unspecified: Secondary | ICD-10-CM | POA: Diagnosis not present

## 2024-03-06 ENCOUNTER — Encounter: Payer: Self-pay | Admitting: Internal Medicine

## 2024-03-12 ENCOUNTER — Other Ambulatory Visit: Payer: Self-pay | Admitting: Cardiology

## 2024-04-01 ENCOUNTER — Ambulatory Visit (HOSPITAL_COMMUNITY)
Admission: RE | Admit: 2024-04-01 | Discharge: 2024-04-01 | Disposition: A | Discharge status: Home / Self Care | Source: Ambulatory Visit | Attending: Acute Care | Admitting: Acute Care

## 2024-04-01 DIAGNOSIS — Z122 Encounter for screening for malignant neoplasm of respiratory organs: Secondary | ICD-10-CM | POA: Insufficient documentation

## 2024-04-01 DIAGNOSIS — F17210 Nicotine dependence, cigarettes, uncomplicated: Secondary | ICD-10-CM | POA: Diagnosis present

## 2024-04-01 DIAGNOSIS — Z87891 Personal history of nicotine dependence: Secondary | ICD-10-CM | POA: Diagnosis present

## 2024-04-08 ENCOUNTER — Other Ambulatory Visit: Payer: Self-pay | Admitting: Acute Care

## 2024-04-08 DIAGNOSIS — Z87891 Personal history of nicotine dependence: Secondary | ICD-10-CM

## 2024-04-08 DIAGNOSIS — F17210 Nicotine dependence, cigarettes, uncomplicated: Secondary | ICD-10-CM

## 2024-04-08 DIAGNOSIS — Z122 Encounter for screening for malignant neoplasm of respiratory organs: Secondary | ICD-10-CM

## 2024-04-16 ENCOUNTER — Encounter: Payer: Self-pay | Admitting: Cardiology

## 2024-04-16 ENCOUNTER — Ambulatory Visit: Attending: Cardiology | Admitting: Cardiology

## 2024-04-16 VITALS — BP 128/64 | HR 75 | Ht 66.0 in | Wt 194.0 lb

## 2024-04-16 DIAGNOSIS — I2510 Atherosclerotic heart disease of native coronary artery without angina pectoris: Secondary | ICD-10-CM | POA: Diagnosis not present

## 2024-04-16 DIAGNOSIS — R002 Palpitations: Secondary | ICD-10-CM

## 2024-04-16 DIAGNOSIS — E782 Mixed hyperlipidemia: Secondary | ICD-10-CM | POA: Diagnosis not present

## 2024-04-16 DIAGNOSIS — I10 Essential (primary) hypertension: Secondary | ICD-10-CM | POA: Diagnosis not present

## 2024-04-16 NOTE — Progress Notes (Signed)
 Clinical Summary [name] is a 73 y.o.male seen today for follow up of the following medical problems.    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  -10/2022 echo: LVEF 55-60%, no WMAs, grade I dd 11/2022 nuclear stress: Patient unable to achieve target heart rate with Bruce protocol. He experienced dyspnea and EKG showed ventricular bigeminy. Switched to Lexiscan  No ischemia by perfusion imaging.    - no specific chest pains. Denies SOB/DOE      2. COPD - followed by pulmonary - 2015 PFTs mild airflow limitation, improves with bronchodilators  -06/2023 PFTs: minimal obstruction - chronic SOB, he reports typically sedentary. Has treadmill at home but has not been suing.        3. Hyperlipidemia - 02/2022 TC 127 TG 143 HDL 44 LDL 58 - he is on simva 20 and fenofibrate - 02/2023 TC 126 TG 122 HDL 40 LDL 64 - compliant with meds   - upcoming labs with pcp   4. HTN - he is compliant with meds      5. 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 Reports being sedentary, has COPD as well.    10/2022 echo: LVEF 55-60%, grade I dd, trivial MR, no AI   6. OSA - he reports was told borderline on most recent check, not requiring cpap   7.PVCs - 12/2022 monitor: rare PACs, 11 runs SVT longest 11 beats. Rare ventricular ectopy - drinks 2-3 pots of coffee per day. High stress at home taking care of his mom   - rare palpitations     AAA screen male over 10 with tobacco history 02/2020 CT A/P no aneurysm noted   His son [name] is also a patient of mine. Past Medical History:  Diagnosis Date   Allergic rhinitis    Anxiety and depression    Asthma    CAD (coronary artery disease)    Mild to moderate nonobstructive LAD disease 2006   Chronic low back pain    COPD (chronic obstructive pulmonary disease) (HCC)     Dyslipidemia    Essential hypertension    GERD (gastroesophageal reflux disease)    Hearing loss    Hemorrhoids    Hyperplastic colon polyp 12/28/10   IBS (irritable bowel syndrome)    Langerhan's cell histiocytosis (HCC)    Mitral regurgitation    OSA on CPAP    Tubular adenoma 12/28/10     Allergies  Allergen Reactions   Bee Venom Swelling     Current Outpatient Medications  Medication Sig Dispense Refill   albuterol  (VENTOLIN  HFA) 108 (90 Base) MCG/ACT inhaler Inhale 2 puffs into the lungs every 6 (six) hours as needed for wheezing or shortness of breath. 6.7 g 6   amLODipine (NORVASC) 10 MG tablet Take 10 mg by mouth daily.     aspirin 81 MG tablet Take 81 mg by mouth daily.     bisoprolol  (ZEBETA ) 5 MG tablet Take 1/2 (one-half) tablet by mouth once daily 45 tablet 2   fenofibrate (TRICOR) 145 MG tablet Take 145 mg by mouth at bedtime.     fish oil-omega-3 fatty acids 1000 MG capsule Take 1 g by mouth daily.     HYDROcodone -acetaminophen  (NORCO/VICODIN) 5-325 MG tablet Take 2 tablets by mouth every 6 (six) hours.  losartan  (COZAAR ) 100 MG tablet Take 1 tablet by mouth once daily 90 tablet 1   Multiple Vitamin (MULTIVITAMIN) capsule Take 1 capsule by mouth daily.     nitroGLYCERIN  (NITROSTAT ) 0.4 MG SL tablet Place 1 tablet (0.4 mg total) under the tongue every 5 (five) minutes as needed for chest pain. 25 tablet 3   omeprazole  (PRILOSEC) 40 MG capsule Take 1 capsule (40 mg total) by mouth daily. 90 capsule 3   simvastatin  (ZOCOR ) 20 MG tablet TAKE 1 TABLET BY MOUTH AT BEDTIME . APPOINTMENT REQUIRED FOR FUTURE REFILLS 15 tablet 0   tadalafil  (CIALIS ) 20 MG tablet Take 1 tablet (20 mg total) by mouth daily as needed for erectile dysfunction. 6 tablet 11   tiZANidine (ZANAFLEX) 4 MG tablet Take 4 mg by mouth every 6 (six) hours.     No current facility-administered medications for this visit.     Past Surgical History:  Procedure Laterality Date   CHOLECYSTECTOMY      COLONOSCOPY  05/23/2005   friable anal canal, hyperplastic polyp   COLONOSCOPY  12/28/2010   Dr. Shaaron- tubular adenoma, hyperplastic polyp   COLONOSCOPY N/A 01/16/2015   Rourk: anal canal and internal hemorrhois. next surveillance colonoscopy 12/2019   COLONOSCOPY WITH PROPOFOL  N/A 04/09/2020   Rourk-normal, query chronic appendicitis as cause of ongoing RLQ abd pain   EAR PINNA RECONSTRUCTION W/ RIB GRAFT     right   ESOPHAGEAL DILATION N/A 09/20/2023   Procedure: DILATION, ESOPHAGUS;  Surgeon: Shaaron Lamar HERO, MD;  Location: [facility];  Service: Endoscopy;  Laterality: N/A;   ESOPHAGOGASTRODUODENOSCOPY     multiple dilations, last EGD 2007 showed small hh, adenomatous appearing gastric mucosa in body but biopsies benign. SB bx negative for Celiac.   ESOPHAGOGASTRODUODENOSCOPY  12/28/2010   Dr. Shaaron- normal esophagus s/p dilationpatchy erythema and erosions.   ESOPHAGOGASTRODUODENOSCOPY N/A 09/20/2023   Procedure: EGD (ESOPHAGOGASTRODUODENOSCOPY);  Surgeon: Shaaron Lamar HERO, MD;  Location: [facility];  Service: Endoscopy;  Laterality: N/A;  11:30AM, ASA 3   FOOT SURGERY     right   INGUINAL HERNIA REPAIR     left   INGUINAL HERNIA REPAIR Left 07/20/2018   Procedure: LEFT INDIRECT INGUINAL AND FEMORAL HERNIA REPAIR WITH MESH;  Surgeon: Kallie Manuelita BROCKS, MD;  Location: [facility];  Service: General;  Laterality: Left;   LUNG BIOPSY  05/24/2007   right   MALONEY DILATION  12/28/2010   Procedure: AGAPITO DILATION;  Surgeon: Lamar HERO Shaaron, MD;  Location: [facility];  Service: Endoscopy;  Laterality: N/A;   TONSILLECTOMY     TRANSTHORACIC ECHOCARDIOGRAM  02/21/2011   EF =>55%, normal chamber size & function; mild mitral and aortic insuff, mild-mod tricuspid insuff; mild pulm htn with rsvp of     Allergies  Allergen Reactions   Bee Venom Swelling      Family History  Problem Relation Age of Onset   Heart disease Father 86       etoh/breathing problmes   COPD  Father    Heart attack Maternal Grandfather    Diabetes Paternal Grandmother    COPD Paternal Grandmother    Heart Problems Paternal Grandfather    GI problems Mother    Hyperlipidemia Mother    COPD Mother    Heart attack Maternal Grandmother        also valvular problems   Heart attack Brother        stenting x3, also cancer   Skin cancer Brother  Hypertension Brother    Hypertension Sister    Colon cancer Neg Hx    Liver disease Neg Hx    Inflammatory bowel disease Neg Hx      Social History Mr. Near reports that he has been smoking cigarettes. He started smoking about 45 years ago. He has a 52.7 pack-year smoking history. He has never used smokeless tobacco. Mr. Copen reports no history of alcohol use.    Physical Examination Today's Vitals   04/16/24 0809  BP: 128/64  Pulse: 75  SpO2: 98%  Weight: 194 lb (88 kg)  Height: 5' 6 (1.676 m)   Body mass index is 31.31 kg/m.  Gen: resting comfortably, no acute distress HEENT: no scleral icterus, pupils equal round and reactive, no palptable cervical adenopathy,  CV: RRR, no mrg, no jvd Resp: Clear to auscultation bilaterally GI: abdomen is soft, non-tender, non-distended, normal bowel sounds, no hepatosplenomegaly MSK: extremities are warm, no edema.  Skin: warm, no rash Neuro:  no focal deficits Psych: appropriate affect   Diagnostic Studies Lexiscan  Myoview  04/28/2015: The left ventricular ejection fraction is normal (55-65%). Nuclear stress EF: 63%. There was no ST segment deviation noted during stress. The study is normal.   Normal stress nuclear study with a small, mild, fixed inferior septal defect consistent with thinning; no ischemia; EF 63 with normal wall motion.   Chest CT 12/04/2018: FINDINGS: Cardiovascular: The heart is normal in size. No pericardial effusion.   No evidence thoracic aortic aneurysm. Mild atherosclerotic calcification of the aortic arch.   Mild three-vessel coronary  atherosclerosis.   Mediastinum/Nodes: No suspicious mediastinal lymphadenopathy.   Visualized thyroid  is unremarkable.   Lungs/Pleura: Mild centrilobular emphysematous changes, upper lung predominant.   Mild subpleural reticulation/fibrosis in the lungs bilaterally, suggesting superimposed chronic interstitial lung disease.   No focal consolidation.   Numerous scattered bilateral pulmonary nodules, measuring up to 6.2 mm, most of which are unchanged. A few bilateral pulmonary nodules are no longer visualized. A few new pulmonary nodules measuring less than 4 mm were not conspicuous on the prior. Overall, these findings are can be considered benign.   No pleural effusion or pneumothorax.   Upper Abdomen: Visualized upper abdomen is grossly unremarkable, noting prior cholecystectomy and a benign left adrenal adenoma.   Musculoskeletal: Degenerative changes of the visualized thoracolumbar spine.   IMPRESSION: Lung-RADS 2, benign appearance or behavior. Continue annual screening with low-dose chest CT without contrast in 12 months.   Aortic Atherosclerosis (ICD10-I70.0) and Emphysema (ICD10-J43.9).   10/2022 echo 1. Left ventricular ejection fraction, by estimation, is 55 to 60%. The  left ventricle has normal function. The left ventricle has no regional  wall motion abnormalities. Left ventricular diastolic parameters are  consistent with Grade I diastolic  dysfunction (impaired relaxation).   2. Right ventricular systolic function is normal. The right ventricular  size is mildly enlarged. There is normal pulmonary artery systolic  pressure.   3. Right atrial size was mildly dilated.   4. The mitral valve is grossly normal. Trivial mitral valve  regurgitation. No evidence of mitral stenosis.   5. The aortic valve was not well visualized. Aortic valve regurgitation  is not visualized. No aortic stenosis is present.   6. The inferior vena cava is normal in size with greater  than 50%  respiratory variability, suggesting right atrial pressure of 3 mmHg.      11/2022 nuclear stress Patient unable to achieve target heart rate with Bruce protocol. He experienced dyspnea and EKG showed ventricular  bigeminy. Switched to Lexiscan .   Stress EKG showed frequent PVCs and no ischemia.   LV perfusion is normal. There is no evidence of ischemia. There is no evidence of infarction.   Left ventricular function is normal. Nuclear stress EF: 65%.   The study is normal. The study is low risk.   06/2023 PFTs Minimal obstructive      Assessment and Plan  1.CAD/Chest pain - mild CAD by prior cath in 2006 - recent nuclear stress test was benign - no recent symptoms, continue to monitor    2. HTN - at goal, continue current meds   3.Hyperlipidemia - f/u upcoming pcp labs   5. Palpitations - monitor with rare benign ectopy - very high caffeine intake, 2-3 pots of coffee daily. Have discussed again weaning.  - overall doing well on bisoprolol , continue current meds - EKG today shows NSR  F/u 6 months      Dorn PHEBE Ross, M.D.

## 2024-04-16 NOTE — Patient Instructions (Signed)
 Medication Instructions:  Continue all current medications.   Labwork: none  Testing/Procedures: none  Follow-Up: 6 months   Any Other Special Instructions Will Be Listed Below (If Applicable).   If you need a refill on your cardiac medications before your next appointment, please call your pharmacy.

## 2024-05-28 DIAGNOSIS — M25562 Pain in left knee: Secondary | ICD-10-CM

## 2024-05-31 ENCOUNTER — Ambulatory Visit (HOSPITAL_COMMUNITY)
Admission: RE | Admit: 2024-05-31 | Discharge: 2024-05-31 | Disposition: A | Source: Ambulatory Visit | Attending: Family Medicine

## 2024-05-31 DIAGNOSIS — M25562 Pain in left knee: Secondary | ICD-10-CM | POA: Diagnosis present

## 2024-06-21 ENCOUNTER — Telehealth (HOSPITAL_BASED_OUTPATIENT_CLINIC_OR_DEPARTMENT_OTHER): Payer: Self-pay | Admitting: *Deleted

## 2024-06-21 NOTE — Telephone Encounter (Signed)
 Dr. Alvan,  Mr. Degroff is requesting preoperative cardiac evaluation for left knee arthroscopy.  He was seen by you in clinic on 04/16/2024.  He remains stable from a cardiac standpoint.    Would you be able to comment on cardiac risk for planned procedure?  If necessary may his aspirin to be held prior to the procedure?  Thank you for your help.  Please direct your response to [facility] preop pool.  Josefa HERO. Matison Nuccio NP-C     06/21/2024, 3:59 PM [facility] [address] [place], [place] [address] Office [phone]

## 2024-06-21 NOTE — Telephone Encounter (Signed)
"  ° °  Pre-operative Risk Assessment    Patient Name: [name]  DOB: 12-09-1950 MRN: [id number]   Date of last office visit: 04/16/24 DR. BRANCH Date of next office visit: NONE   Request for Surgical Clearance    Procedure:  LEFT KNEE SCOPE  Date of Surgery:  Clearance TBD                                Surgeon:  DR. FONDA OLMSTED Surgeon's Group or Practice Name:  [facility] Phone number:  [phone] Fax number:  [phone] ATTN: MAEOLA DIVERS   Type of Clearance Requested:   - Medical  - Pharmacy:  Hold Aspirin     Type of Anesthesia:  CHOICE   Additional requests/questions:    Signed, Ereka Brau   06/21/2024, 3:47 PM   "

## 2024-07-26 ENCOUNTER — Ambulatory Visit: Admitting: Urology
